# Patient Record
Sex: Male | Born: 1980 | Race: White | Hispanic: No | Marital: Single | Smoking: Former smoker
Health system: Southern US, Community
[De-identification: ages and names within clinical notes are randomized; demographics above are authoritative.]

## PROBLEM LIST (undated history)

## (undated) DIAGNOSIS — F209 Schizophrenia, unspecified: Secondary | ICD-10-CM

## (undated) DIAGNOSIS — F319 Bipolar disorder, unspecified: Secondary | ICD-10-CM

## (undated) DIAGNOSIS — F1010 Alcohol abuse, uncomplicated: Secondary | ICD-10-CM

## (undated) DIAGNOSIS — F1410 Cocaine abuse, uncomplicated: Secondary | ICD-10-CM

---

## 2003-08-22 ENCOUNTER — Emergency Department (HOSPITAL_COMMUNITY): Admission: EM | Admit: 2003-08-22 | Discharge: 2003-08-22 | Payer: Self-pay | Admitting: Emergency Medicine

## 2003-11-04 ENCOUNTER — Emergency Department (HOSPITAL_COMMUNITY): Admission: EM | Admit: 2003-11-04 | Discharge: 2003-11-04 | Payer: Self-pay | Admitting: Emergency Medicine

## 2004-05-16 ENCOUNTER — Emergency Department: Payer: Self-pay | Admitting: Emergency Medicine

## 2007-11-20 ENCOUNTER — Emergency Department (HOSPITAL_COMMUNITY): Admission: EM | Admit: 2007-11-20 | Discharge: 2007-11-20 | Payer: Self-pay | Admitting: Emergency Medicine

## 2012-04-01 ENCOUNTER — Encounter (HOSPITAL_COMMUNITY): Payer: Self-pay | Admitting: *Deleted

## 2012-04-01 ENCOUNTER — Emergency Department (HOSPITAL_COMMUNITY)
Admission: EM | Admit: 2012-04-01 | Discharge: 2012-04-01 | Disposition: A | Discharge status: Home / Self Care | Payer: Self-pay | Attending: Emergency Medicine | Admitting: Emergency Medicine

## 2012-04-01 DIAGNOSIS — F1410 Cocaine abuse, uncomplicated: Secondary | ICD-10-CM | POA: Insufficient documentation

## 2012-04-01 DIAGNOSIS — F1010 Alcohol abuse, uncomplicated: Secondary | ICD-10-CM | POA: Insufficient documentation

## 2012-04-01 DIAGNOSIS — IMO0002 Reserved for concepts with insufficient information to code with codable children: Secondary | ICD-10-CM | POA: Insufficient documentation

## 2012-04-01 DIAGNOSIS — F17200 Nicotine dependence, unspecified, uncomplicated: Secondary | ICD-10-CM | POA: Insufficient documentation

## 2012-04-01 DIAGNOSIS — R451 Restlessness and agitation: Secondary | ICD-10-CM

## 2012-04-01 HISTORY — DX: Alcohol abuse, uncomplicated: F10.10

## 2012-04-01 HISTORY — DX: Cocaine abuse, uncomplicated: F14.10

## 2012-04-01 LAB — URINALYSIS, ROUTINE W REFLEX MICROSCOPIC
Bilirubin Urine: NEGATIVE
Hgb urine dipstick: NEGATIVE
Ketones, ur: NEGATIVE mg/dL
Nitrite: NEGATIVE
Protein, ur: NEGATIVE mg/dL
Urobilinogen, UA: 1 mg/dL (ref 0.0–1.0)

## 2012-04-01 LAB — POCT I-STAT, CHEM 8
Calcium, Ion: 1.16 mmol/L (ref 1.12–1.23)
Creatinine, Ser: 1.3 mg/dL (ref 0.50–1.35)
Glucose, Bld: 89 mg/dL (ref 70–99)
HCT: 54 % — ABNORMAL HIGH (ref 39.0–52.0)
Hemoglobin: 18.4 g/dL — ABNORMAL HIGH (ref 13.0–17.0)
Potassium: 3.9 mEq/L (ref 3.5–5.1)
TCO2: 27 mmol/L (ref 0–100)

## 2012-04-01 LAB — BASIC METABOLIC PANEL
BUN: 8 mg/dL (ref 6–23)
Calcium: 9.6 mg/dL (ref 8.4–10.5)
Creatinine, Ser: 1.01 mg/dL (ref 0.50–1.35)
GFR calc non Af Amer: >90 mL/min (ref >90)
Glucose, Bld: 95 mg/dL (ref 70–99)
Potassium: 3.9 mEq/L (ref 3.5–5.1)

## 2012-04-01 LAB — CBC
MCH: 32.4 pg (ref 26.0–34.0)
MCHC: 35.4 g/dL (ref 30.0–36.0)
Platelets: 270 10*3/uL (ref 150–400)
RBC: 5.59 MIL/uL (ref 4.22–5.81)
RDW: 12.6 % (ref 11.5–15.5)

## 2012-04-01 LAB — RAPID URINE DRUG SCREEN, HOSP PERFORMED
Amphetamines: NOT DETECTED
Opiates: NOT DETECTED
Tetrahydrocannabinol: NOT DETECTED

## 2012-04-01 MED ORDER — ACETAMINOPHEN 325 MG PO TABS: 650.0000 mg | ORAL_TABLET | ORAL | Status: DC | PRN

## 2012-04-01 MED ORDER — IBUPROFEN 600 MG PO TABS: 600.0000 mg | ORAL_TABLET | Freq: Three times a day (TID) | ORAL | Status: DC | PRN

## 2012-04-01 MED ORDER — NICOTINE 21 MG/24HR TD PT24: 21.0000 mg | MEDICATED_PATCH | Freq: Every day | TRANSDERMAL | Status: DC

## 2012-04-01 NOTE — ED Provider Notes (Signed)
Telepsych performed. As per Dr. Rob Bunting, he doesn't meet criteria for IVC and is not homicidal or suicidal. Will refer to outpatient treatment program. Will give a list of counselors and psychiatrist. Otherwise, no medical issues. Safe for D/c     Richardean Canal, MD 04/01/12 [phone]
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
## 2012-04-01 NOTE — ED Notes (Signed)
Dr Henderson Cloud is going to rescind IVC and recommend outpatient etoh treatment.

## 2012-04-01 NOTE — ED Notes (Signed)
Pt changed in to blue paper scrubs. Pt and belongings wanded by security. 2 belongings bags are locked in triage locker E.

## 2012-04-01 NOTE — ED Notes (Signed)
Telepsych faxed and call to initiate placed.

## 2012-04-01 NOTE — ED Notes (Signed)
Pt discharging home with outpatient ETOH referrals per MD orders after Telepsych orders rescinded the IVC papers.

## 2012-04-01 NOTE — [facility] Counselor (Signed)
Pt is being d/c per telepsych. Counselor gave pt inpatient and outpatient SA and emergency mental health referrals. Pt stated he had no questions about referrals.
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
## 2015-03-06 ENCOUNTER — Ambulatory Visit: Payer: Self-pay

## 2015-03-12 ENCOUNTER — Ambulatory Visit: Payer: Self-pay | Attending: Family Medicine

## 2015-04-28 ENCOUNTER — Emergency Department (HOSPITAL_COMMUNITY)
Admission: EM | Admit: 2015-04-28 | Discharge: 2015-04-29 | Disposition: A | Discharge status: Home / Self Care | Payer: Self-pay | Attending: Emergency Medicine | Admitting: Emergency Medicine

## 2015-04-28 DIAGNOSIS — F1490 Cocaine use, unspecified, uncomplicated: Secondary | ICD-10-CM

## 2015-04-28 DIAGNOSIS — F14920 Cocaine use, unspecified with intoxication, uncomplicated: Secondary | ICD-10-CM | POA: Insufficient documentation

## 2015-04-28 DIAGNOSIS — Z720 Tobacco use: Secondary | ICD-10-CM | POA: Insufficient documentation

## 2015-04-28 DIAGNOSIS — R079 Chest pain, unspecified: Secondary | ICD-10-CM | POA: Insufficient documentation

## 2015-04-28 MED ORDER — LORAZEPAM 2 MG/ML IJ SOLN
1.0000 mg | Freq: Once | INTRAMUSCULAR | Status: AC
  Administered 2015-04-29: 1 mg via INTRAVENOUS
  Filled 2015-04-28: qty 1

## 2015-04-28 NOTE — ED Provider Notes (Signed)
CSN: [id number]     Arrival date & time 04/28/15  2341 History   First MD Initiated Contact with Patient 04/28/15 2343     No chief complaint on file.    (Consider location/radiation/quality/duration/timing/severity/associated sxs/prior Treatment) HPI Comments: Smoked crack cocaine and subsequently had chest pain  Patient is a 34 y.o. male presenting with chest pain. The history is provided by the patient.  Chest Pain Pain location:  Substernal area Pain quality: pressure   Pain radiates to:  Does not radiate Pain severity:  Moderate Onset quality:  Sudden Duration:  1 hour Timing:  Constant Progression:  Unchanged Chronicity:  New Context comment:  After smoking crack cocaine Relieved by:  Nothing Worsened by:  Nothing tried Associated symptoms: no cough, no fever, no shortness of breath and not vomiting     Past Medical History  Diagnosis Date  . Cocaine abuse   . Alcohol abuse    No past surgical history on file. No family history on file. Social History  Substance Use Topics  . Smoking status: Current Every Day Smoker    Types: Cigarettes  . Smokeless tobacco: Not on file  . Alcohol Use: Yes     Comment: daily    Review of Systems  Constitutional: Negative for fever.  Respiratory: Negative for cough and shortness of breath.   Cardiovascular: Positive for chest pain.  Gastrointestinal: Negative for vomiting.  All other systems reviewed and are negative.     Allergies  Review of patient's allergies indicates no known allergies.  Home Medications   Prior to Admission medications   Not on File   There were no vitals taken for this visit. Physical Exam  Constitutional: He is oriented to person, place, and time. He appears well-developed and well-nourished. No distress.  HENT:  Head: Normocephalic and atraumatic.  Mouth/Throat: Oropharynx is clear and moist. No oropharyngeal exudate.  Eyes: EOM are normal. Pupils are equal, round, and reactive to  light.  Neck: Normal range of motion. Neck supple.  Cardiovascular: Normal rate and regular rhythm.  Exam reveals no friction rub.   No murmur heard. Pulmonary/Chest: Effort normal and breath sounds normal. No respiratory distress. He has no wheezes. He has no rales.  Abdominal: Soft. He exhibits no distension. There is no tenderness. There is no rebound.  Musculoskeletal: Normal range of motion. He exhibits no edema.  Neurological: He is alert and oriented to person, place, and time. No cranial nerve deficit. He exhibits normal muscle tone. Coordination normal.  Skin: No rash noted. He is not diaphoretic.  Nursing note and vitals reviewed.   ED Course  Procedures (including critical care time) Labs Review Labs Reviewed  CBC  BASIC METABOLIC PANEL  I-STAT TROPOININ, ED    Imaging Review Dg Chest 2 View  04/29/2015   CLINICAL DATA:  Acute onset of centralized chest pain and shortness of breath. Initial encounter.  EXAM: CHEST  2 VIEW  COMPARISON:  Chest radiograph performed 11/04/2003  FINDINGS: The lungs are well-aerated and clear. There is no evidence of focal opacification, pleural effusion or pneumothorax.  The heart is normal in size; the mediastinal contour is within normal limits. No acute osseous abnormalities are seen.  IMPRESSION: No acute cardiopulmonary process seen.   Electronically Signed   By: Roanna Raider M.D.   On: 04/29/2015 00:53   I have personally reviewed and evaluated these images and lab results as part of my medical decision-making.   EKG Interpretation   Date/Time:  Monday April 28 2015 23:55:15 EDT Ventricular Rate:  75 PR Interval:  127 QRS Duration: 98 QT Interval:  367 QTC Calculation: 410 R Axis:   87 Text Interpretation:  Sinus rhythm No prior for comparison Confirmed by  Gwendolyn Grant  MD, Chanel Mckesson ([id number]) on 04/29/2015 12:16:47 AM      MDM   Final diagnoses:  Chest pain, unspecified chest pain type  Cocaine use    40M here with chest pain from  cocaine. Feeling ok now. Will check labs, EKG. Initial labs ok. Discussed serial troponin testing. Patient would rather go home. He's young, normal EKG, no further chest pain. He can f/u with his PCP. I feel going home without 2nd troponin is reasonable.   Elwin Mocha, MD 04/29/15 [phone]

## 2015-04-29 ENCOUNTER — Encounter (HOSPITAL_COMMUNITY): Payer: Self-pay | Admitting: Emergency Medicine

## 2015-04-29 ENCOUNTER — Emergency Department (HOSPITAL_COMMUNITY): Payer: Self-pay

## 2015-04-29 LAB — CBC
HCT: 46.6 % (ref 39.0–52.0)
Hemoglobin: 16.3 g/dL (ref 13.0–17.0)
MCH: 31.2 pg (ref 26.0–34.0)
MCHC: 35 g/dL (ref 30.0–36.0)
MCV: 89.1 fL (ref 78.0–100.0)
PLATELETS: 194 10*3/uL (ref 150–400)
RBC: 5.23 MIL/uL (ref 4.22–5.81)
RDW: 12.2 % (ref 11.5–15.5)
WBC: 10.6 10*3/uL — AB (ref 4.0–10.5)

## 2015-04-29 LAB — BASIC METABOLIC PANEL
ANION GAP: 10 (ref 5–15)
BUN: 11 mg/dL (ref 6–20)
CO2: 25 mmol/L (ref 22–32)
Calcium: 9.5 mg/dL (ref 8.9–10.3)
Chloride: 101 mmol/L (ref 101–111)
Creatinine, Ser: 1.13 mg/dL (ref 0.61–1.24)
GLUCOSE: 102 mg/dL — AB (ref 65–99)
POTASSIUM: 3.5 mmol/L (ref 3.5–5.1)
SODIUM: 136 mmol/L (ref 135–145)

## 2015-04-29 LAB — I-STAT TROPONIN, ED: Troponin i, poc: 0 ng/mL (ref 0.00–0.08)

## 2015-04-29 NOTE — ED Notes (Signed)
Pt to ED via [facility] with chest pain.  Onset after using cocaine.

## 2015-04-29 NOTE — ED Notes (Signed)
Pt st's after using cocaine he developed pain in central chest with tightness under left arm.  Pt denies any pain or discomfort at this time.  Family at bedside.

## 2015-04-29 NOTE — Discharge Instructions (Signed)

## 2017-03-12 ENCOUNTER — Ambulatory Visit (HOSPITAL_COMMUNITY)
Admission: EM | Admit: 2017-03-12 | Discharge: 2017-03-12 | Disposition: A | Discharge status: Home / Self Care | Payer: Self-pay | Attending: Family | Admitting: Family

## 2017-03-12 ENCOUNTER — Encounter (HOSPITAL_COMMUNITY): Payer: Self-pay | Admitting: Emergency Medicine

## 2017-03-12 DIAGNOSIS — H578 Other specified disorders of eye and adnexa: Secondary | ICD-10-CM

## 2017-03-12 DIAGNOSIS — Z23 Encounter for immunization: Secondary | ICD-10-CM

## 2017-03-12 DIAGNOSIS — S0502XA Injury of conjunctiva and corneal abrasion without foreign body, left eye, initial encounter: Secondary | ICD-10-CM

## 2017-03-12 MED ORDER — ERYTHROMYCIN 5 MG/GM OP OINT: TOPICAL_OINTMENT | OPHTHALMIC | 0 refills | Status: DC

## 2017-03-12 MED ORDER — TETANUS-DIPHTH-ACELL PERTUSSIS 5-2.5-18.5 LF-MCG/0.5 IM SUSP
INTRAMUSCULAR | Status: AC
  Filled 2017-03-12: qty 0.5

## 2017-03-12 MED ORDER — FLUORESCEIN SODIUM 0.6 MG OP STRP
ORAL_STRIP | OPHTHALMIC | Status: AC
  Filled 2017-03-12: qty 3

## 2017-03-12 MED ORDER — IBUPROFEN 800 MG PO TABS: 800.0000 mg | ORAL_TABLET | Freq: Three times a day (TID) | ORAL | 0 refills | Status: AC

## 2017-03-12 MED ORDER — TETANUS-DIPHTH-ACELL PERTUSSIS 5-2.5-18.5 LF-MCG/0.5 IM SUSP
0.5000 mL | Freq: Once | INTRAMUSCULAR | Status: AC
  Administered 2017-03-12: 0.5 mL via INTRAMUSCULAR

## 2017-03-12 NOTE — ED Triage Notes (Signed)
Pt reports saw dust in his left eye onset 1300  Sts he was cutting wood w/a chainsaw and did not use eye ware  Sx today include irritation, pain, watery and redness  A&O x4... NAD... Ambulatory

## 2017-03-12 NOTE — Discharge Instructions (Signed)
Most small abrasions (less than one-fourth of corneal surface area will heal overnight if the lid is down and there is no rubbing or squeezing. Ibuprofen for pain. Antibiotic eye drop.   If there is no improvement in your symptoms, or if there is any worsening of symptoms, or if you have any additional concerns, please return for re-evaluation; or, if we are closed, consider going to the Emergency Room for evaluation if symptoms urgent.

## 2017-03-12 NOTE — ED Provider Notes (Signed)
[facility]    CSN: [id number] Arrival date & time: 03/12/17  1402     History   Chief Complaint Chief Complaint  Patient presents with  . Eye Problem    HPI Jose Mitchell is a 36 y.o. male.   Chief complaint of left eye irritation x one day. Was using a chainsaw earlier this afternoon and reports some "sawdust" got in his eye.He is not concerned about any shards of metal from chain saw itself 'it was just saw dust.'    Watery discharge from left eye. No changes in vision, photophobia, N, vomiting, fever. Endorses left eye pain when he closes eyelid- when he pulls eyelid away from eye, no pain.  Last tetanus 7 years ago  Does not wear contacts.       Past Medical History:  Diagnosis Date  . Alcohol abuse   . Cocaine abuse     There are no active problems to display for this patient.   History reviewed. No pertinent surgical history.     Home Medications    Prior to Admission medications   Medication Sig Start Date End Date Taking? Authorizing Provider  erythromycin ophthalmic ointment Use one half inch four times daily to affected eye (s) x 5 days. 03/12/17   Allegra Grana, FNP  ibuprofen (ADVIL,MOTRIN) 800 MG tablet Take 1 tablet (800 mg total) by mouth 3 (three) times daily. 03/12/17 03/17/17  Allegra Grana, FNP    Family History History reviewed. No pertinent family history.  Social History Social History  Substance Use Topics  . Smoking status: Current Every Day Smoker    Types: Cigarettes  . Smokeless tobacco: Never Used  . Alcohol use Yes     Comment: daily     Allergies   Septra [sulfamethoxazole-trimethoprim]   Review of Systems Review of Systems  Constitutional: Negative for chills and fever.  Eyes: Positive for pain, discharge and redness. Negative for photophobia, itching and visual disturbance.  Respiratory: Negative for cough.   Cardiovascular: Negative for chest pain and palpitations.  Gastrointestinal:  Negative for nausea and vomiting.  Neurological: Negative for headaches.     Physical Exam Triage Vital Signs ED Triage Vitals [03/12/17 1459]  Enc Vitals Group     BP 135/89     Pulse Rate 63     Resp 16     Temp 97.9 F (36.6 C)     Temp Source Oral     SpO2 100 %     Weight      Height      Head Circumference      Peak Flow      Pain Score      Pain Loc      Pain Edu?      Excl. in GC?    No data found.   Updated Vital Signs BP 135/89 (BP Location: Left Arm)   Pulse 63   Temp 97.9 F (36.6 C) (Oral)   Resp 16   SpO2 100%   Visual Acuity Right Eye Distance:   Left Eye Distance:   Bilateral Distance:    Right Eye Near:   Left Eye Near:    Bilateral Near:     Physical Exam  Constitutional: He appears well-developed and well-nourished.  HENT:  Head: Normocephalic and atraumatic.  Right Ear: Hearing, tympanic membrane, external ear and ear canal normal. No drainage, swelling or tenderness. Tympanic membrane is not injected, not erythematous and not bulging. No middle ear effusion. No  decreased hearing is noted.  Left Ear: Hearing, tympanic membrane, external ear and ear canal normal. No drainage, swelling or tenderness. Tympanic membrane is not injected, not erythematous and not bulging.  No middle ear effusion. No decreased hearing is noted.  Nose: Nose normal. Right sinus exhibits no maxillary sinus tenderness and no frontal sinus tenderness. Left sinus exhibits no maxillary sinus tenderness and no frontal sinus tenderness.  Mouth/Throat: Uvula is midline, oropharynx is clear and moist and mucous membranes are normal. No oropharyngeal exudate, posterior oropharyngeal edema, posterior oropharyngeal erythema or tonsillar abscesses.  Eyes: Pupils are equal, round, and reactive to light. Conjunctivae, EOM and lids are normal. Lids are everted and swept, no foreign bodies found.  No external eye lesions. Surrounding skin intact.   Right eye:   Mild injection of  the conjunctiva. No white spots, opacity, or foreign body appreciated. No collection of blood or pus in the anterior chamber. No ciliary flush surrounding iris.   Left eye:   Diffuse injection of the conjunctiva. No white spots, opacity, or foreign body appreciated. No collection of blood or pus in the anterior chamber. No ciliary flush surrounding iris.   With fluorescin, no fine branches. 3 abrasions noted -  3 oclock 65mm 6 oclock 80mm 9 oclock 54mm  No foreign bodies appreciated.  No photophobia or eye pain appreciated during exam.     Cardiovascular: Regular rhythm and normal heart sounds.   Pulmonary/Chest: Effort normal and breath sounds normal. No respiratory distress. He has no wheezes. He has no rhonchi. He has no rales.  Lymphadenopathy:       Head (right side): No submental, no submandibular, no tonsillar, no preauricular, no posterior auricular and no occipital adenopathy present.       Head (left side): No submental, no submandibular, no tonsillar, no preauricular, no posterior auricular and no occipital adenopathy present.    He has no cervical adenopathy.       Right cervical: No superficial cervical, no deep cervical and no posterior cervical adenopathy present.      Left cervical: No superficial cervical, no deep cervical and no posterior cervical adenopathy present.  Neurological: He is alert.  Skin: Skin is warm and dry.  Psychiatric: He has a normal mood and affect. His speech is normal and behavior is normal.  Vitals reviewed.    UC Treatments / Results  Labs (all labs ordered are listed, but only abnormal results are displayed) Labs Reviewed - No data to display  EKG  EKG Interpretation None       Radiology No results found.  Procedures Procedures (including critical care time)  Medications Ordered in UC Medications  Tdap (BOOSTRIX) injection 0.5 mL (0.5 mLs Intramuscular Given 03/12/17 1657)     Initial Impression / Assessment and Plan / UC  Course  I have reviewed the triage vital signs and the nursing notes.  Pertinent labs & imaging results that were available during my care of the patient were reviewed by me and considered in my medical decision making (see chart for details).       Final Clinical Impressions(s) / UC Diagnoses   Final diagnoses:  Abrasion of left cornea, initial encounter   History and exam most consistent with corneal abrasion. Vision intact. Will treat with topical antibiotic. Given Tdap Booster today. No evidence of foreign bodies. Close vigilance advised patient  and anticipated guidance that likely a small corneal abrasion such as his would heal overnight. For pain management, advised ibuprofen. Patient requested something  stronger, and I gave prescription strength ibuprofen for pain.  return precautions given.   New Prescriptions New Prescriptions   ERYTHROMYCIN OPHTHALMIC OINTMENT    Use one half inch four times daily to affected eye (s) x 5 days.   IBUPROFEN (ADVIL,MOTRIN) 800 MG TABLET    Take 1 tablet (800 mg total) by mouth 3 (three) times daily.     Controlled Substance Prescriptions [place] Controlled Substance Registry consulted? Not Applicable   Allegra Grana, FNP 03/12/17 [phone]

## 2017-05-04 ENCOUNTER — Encounter (HOSPITAL_COMMUNITY): Payer: Self-pay | Admitting: Family Medicine

## 2017-05-04 ENCOUNTER — Ambulatory Visit (HOSPITAL_COMMUNITY)
Admission: EM | Admit: 2017-05-04 | Discharge: 2017-05-04 | Disposition: A | Discharge status: Home / Self Care | Payer: Self-pay | Attending: Family Medicine | Admitting: Family Medicine

## 2017-05-04 DIAGNOSIS — Z882 Allergy status to sulfonamides status: Secondary | ICD-10-CM | POA: Insufficient documentation

## 2017-05-04 DIAGNOSIS — R21 Rash and other nonspecific skin eruption: Secondary | ICD-10-CM | POA: Insufficient documentation

## 2017-05-04 DIAGNOSIS — J039 Acute tonsillitis, unspecified: Secondary | ICD-10-CM | POA: Insufficient documentation

## 2017-05-04 DIAGNOSIS — J029 Acute pharyngitis, unspecified: Secondary | ICD-10-CM

## 2017-05-04 DIAGNOSIS — F17210 Nicotine dependence, cigarettes, uncomplicated: Secondary | ICD-10-CM | POA: Insufficient documentation

## 2017-05-04 DIAGNOSIS — R509 Fever, unspecified: Secondary | ICD-10-CM

## 2017-05-04 LAB — POCT RAPID STREP A: Streptococcus, Group A Screen (Direct): NEGATIVE

## 2017-05-04 MED ORDER — AMOXICILLIN 500 MG PO CAPS: 1000.0000 mg | ORAL_CAPSULE | Freq: Two times a day (BID) | ORAL | 0 refills | Status: DC

## 2017-05-04 NOTE — ED Provider Notes (Signed)
[facility]    CSN: [id number] Arrival date & time: 05/04/17  1505     History   Chief Complaint Chief Complaint  Patient presents with  . Sore Throat  . Rash  . Fever    HPI Jose Mitchell is a 36 y.o. male.   36 year old male complaining of sore throat within abdominal rash that developed 2-1/2 days ago he states that on Monday, 2 days ago he had a temperature of 99 to 102. He was feeling ill until today when he felt a little better and went to work and decided to come and have his throat and rash checked out.      Past Medical History:  Diagnosis Date  . Alcohol abuse   . Cocaine abuse (HCC)     There are no active problems to display for this patient.   History reviewed. No pertinent surgical history.     Home Medications    Prior to Admission medications   Medication Sig Start Date End Date Taking? Authorizing Provider  amoxicillin (AMOXIL) 500 MG capsule Take 2 capsules (1,000 mg total) by mouth 2 (two) times daily. 05/04/17   Hayden Rasmussen, NP  erythromycin ophthalmic ointment Use one half inch four times daily to affected eye (s) x 5 days. 03/12/17   Allegra Grana, FNP    Family History History reviewed. No pertinent family history.  Social History Social History  Substance Use Topics  . Smoking status: Current Every Day Smoker    Types: Cigarettes  . Smokeless tobacco: Never Used  . Alcohol use Yes     Comment: daily     Allergies   Septra [sulfamethoxazole-trimethoprim]   Review of Systems Review of Systems  Constitutional: Positive for activity change and fever.  HENT: Positive for sore throat.   Respiratory: Negative.   Cardiovascular: Negative.   Skin: Positive for rash.  Neurological: Negative.   All other systems reviewed and are negative.    Physical Exam Triage Vital Signs ED Triage Vitals [05/04/17 1524]  Enc Vitals Group     BP (!) 143/93     Pulse Rate 85     Resp 18     Temp 98.3 F (36.8 C)     Temp src      SpO2 100 %     Weight      Height      Head Circumference      Peak Flow      Pain Score 3     Pain Loc      Pain Edu?      Excl. in GC?    No data found.   Updated Vital Signs BP (!) 143/93   Pulse 85   Temp 98.3 F (36.8 C)   Resp 18   SpO2 100%   Visual Acuity Right Eye Distance:   Left Eye Distance:   Bilateral Distance:    Right Eye Near:   Left Eye Near:    Bilateral Near:     Physical Exam  Constitutional: He is oriented to person, place, and time. He appears well-developed and well-nourished. No distress.  HENT:  Mouth/Throat: Oropharyngeal exudate present.  Oropharynx with enlarged bilateraltonsils, Bell 50 red appearance with exudates. Airway widely patent.  Eyes: EOM are normal.  Neck: Normal range of motion. Neck supple.  Cardiovascular: Normal rate.   Pulmonary/Chest: Effort normal. No respiratory distress.  Musculoskeletal: He exhibits no edema.  Neurological: He is alert and oriented to person, place, and time.  He exhibits normal muscle tone.  Skin: Skin is warm and dry.  Psychiatric: He has a normal mood and affect.  Nursing note and vitals reviewed.    UC Treatments / Results  Labs (all labs ordered are listed, but only abnormal results are displayed) Labs Reviewed  CULTURE, GROUP A STREP [facility])  POCT RAPID STREP A    EKG  EKG Interpretation None       Radiology No results found.  Procedures Procedures (including critical care time)  Medications Ordered in UC Medications - No data to display   Initial Impression / Assessment and Plan / UC Course  I have reviewed the triage vital signs and the nursing notes.  Pertinent labs & imaging results that were available during my care of the patient were reviewed by me and considered in my medical decision making (see chart for details).    Take your medicine as directed. Ibuprofen 600-800 mg every 6-8 hours. Tylenol every 4 hours if needed. Drink plenty fluids and  stay well-hydrated. May use Cepacol lozenges for sore throat    Final Clinical Impressions(s) / UC Diagnoses   Final diagnoses:  Exudative tonsillitis  Rash    New Prescriptions New Prescriptions   AMOXICILLIN (AMOXIL) 500 MG CAPSULE    Take 2 capsules (1,000 mg total) by mouth 2 (two) times daily.     Controlled Substance Prescriptions [place] Controlled Substance Registry consulted? Not Applicable   Hayden Rasmussen, NP 05/04/17 [phone]

## 2017-05-04 NOTE — ED Triage Notes (Signed)
Pt here for sore throat, rash and fever. Last motrin 5 this am.

## 2017-05-04 NOTE — Discharge Instructions (Signed)
Take your medicine as directed. Ibuprofen 600-800 mg every 6-8 hours. Tylenol every 4 hours if needed. Drink plenty fluids and stay well-hydrated. May use Cepacol lozenges for sore throat

## 2017-05-06 LAB — CULTURE, GROUP A STREP (THRC)

## 2018-02-22 ENCOUNTER — Other Ambulatory Visit: Payer: Self-pay

## 2018-02-22 ENCOUNTER — Encounter (HOSPITAL_COMMUNITY): Payer: Self-pay | Admitting: *Deleted

## 2018-02-22 ENCOUNTER — Emergency Department (HOSPITAL_COMMUNITY)
Admission: EM | Admit: 2018-02-22 | Discharge: 2018-02-23 | Disposition: A | Discharge status: Home / Self Care | Payer: BLUE CROSS/BLUE SHIELD | Attending: Emergency Medicine | Admitting: Emergency Medicine

## 2018-02-22 DIAGNOSIS — F319 Bipolar disorder, unspecified: Secondary | ICD-10-CM

## 2018-02-22 DIAGNOSIS — R51 Headache: Secondary | ICD-10-CM | POA: Insufficient documentation

## 2018-02-22 DIAGNOSIS — F1410 Cocaine abuse, uncomplicated: Secondary | ICD-10-CM | POA: Diagnosis present

## 2018-02-22 DIAGNOSIS — F250 Schizoaffective disorder, bipolar type: Secondary | ICD-10-CM | POA: Diagnosis not present

## 2018-02-22 DIAGNOSIS — F17210 Nicotine dependence, cigarettes, uncomplicated: Secondary | ICD-10-CM | POA: Diagnosis not present

## 2018-02-22 HISTORY — DX: Bipolar disorder, unspecified: F31.9

## 2018-02-22 HISTORY — DX: Schizophrenia, unspecified: F20.9

## 2018-02-22 LAB — COMPREHENSIVE METABOLIC PANEL
ALBUMIN: 4.5 g/dL (ref 3.5–5.0)
ALT: 11 U/L (ref 0–44)
ANION GAP: 9 (ref 5–15)
AST: 16 U/L (ref 15–41)
Alkaline Phosphatase: 47 U/L (ref 38–126)
BILIRUBIN TOTAL: 0.4 mg/dL (ref 0.3–1.2)
BUN: 8 mg/dL (ref 6–20)
CHLORIDE: 103 mmol/L (ref 98–111)
CO2: 30 mmol/L (ref 22–32)
Calcium: 9.6 mg/dL (ref 8.9–10.3)
Creatinine, Ser: 0.99 mg/dL (ref 0.61–1.24)
GFR calc Af Amer: >60 mL/min (ref >60)
GFR calc non Af Amer: >60 mL/min (ref >60)
GLUCOSE: 55 mg/dL — AB (ref 70–99)
POTASSIUM: 3.5 mmol/L (ref 3.5–5.1)
SODIUM: 142 mmol/L (ref 135–145)
TOTAL PROTEIN: 7.6 g/dL (ref 6.5–8.1)

## 2018-02-22 LAB — RAPID URINE DRUG SCREEN, HOSP PERFORMED
AMPHETAMINES: NOT DETECTED
BENZODIAZEPINES: NOT DETECTED
Barbiturates: NOT DETECTED
Cocaine: POSITIVE — AB
OPIATES: NOT DETECTED
TETRAHYDROCANNABINOL: NOT DETECTED

## 2018-02-22 LAB — CBC
HEMATOCRIT: 48.9 % (ref 39.0–52.0)
HEMOGLOBIN: 16.8 g/dL (ref 13.0–17.0)
MCH: 30.6 pg (ref 26.0–34.0)
MCHC: 34.4 g/dL (ref 30.0–36.0)
MCV: 89.1 fL (ref 78.0–100.0)
PLATELETS: 315 10*3/uL (ref 150–400)
RBC: 5.49 MIL/uL (ref 4.22–5.81)
RDW: 12.2 % (ref 11.5–15.5)
WBC: 10 10*3/uL (ref 4.0–10.5)

## 2018-02-22 LAB — ETHANOL: Alcohol, Ethyl (B): <10 mg/dL (ref <10)

## 2018-02-22 MED ORDER — TRAZODONE HCL 100 MG PO TABS
100.0000 mg | ORAL_TABLET | Freq: Every evening | ORAL | Status: DC | PRN
  Administered 2018-02-22: 100 mg via ORAL
  Filled 2018-02-22: qty 1

## 2018-02-22 MED ORDER — HALOPERIDOL 5 MG PO TABS
5.0000 mg | ORAL_TABLET | Freq: Four times a day (QID) | ORAL | Status: DC | PRN
  Filled 2018-02-22: qty 1

## 2018-02-22 MED ORDER — HYDROXYZINE HCL 25 MG PO TABS
50.0000 mg | ORAL_TABLET | Freq: Three times a day (TID) | ORAL | Status: DC | PRN
  Administered 2018-02-22: 50 mg via ORAL
  Filled 2018-02-22: qty 2

## 2018-02-22 MED ORDER — NICOTINE 21 MG/24HR TD PT24
21.0000 mg | MEDICATED_PATCH | Freq: Every day | TRANSDERMAL | Status: DC
  Administered 2018-02-22: 21 mg via TRANSDERMAL
  Filled 2018-02-22: qty 1

## 2018-02-22 MED ORDER — HALOPERIDOL LACTATE 5 MG/ML IJ SOLN: 5.0000 mg | Freq: Four times a day (QID) | INTRAMUSCULAR | Status: DC | PRN

## 2018-02-22 MED ORDER — LOPERAMIDE HCL 2 MG PO CAPS: 4.0000 mg | ORAL_CAPSULE | ORAL | Status: DC | PRN

## 2018-02-22 MED ORDER — IBUPROFEN 800 MG PO TABS
800.0000 mg | ORAL_TABLET | Freq: Four times a day (QID) | ORAL | Status: DC | PRN
  Administered 2018-02-22: 800 mg via ORAL
  Filled 2018-02-22: qty 1

## 2018-02-22 NOTE — ED Notes (Signed)
Pt has been dressed out in paper scrubs.  Pt's belongings placed in bag. Pt was been wanded by security.

## 2018-02-22 NOTE — ED Notes (Signed)
Placed the following patient belongings in locker 28:  1 bracelet, 1 watch, 1 brown leather wallet, 1 lighter, 1 container tobacco dip, 1 pair black tennis shoes, 1 white t-shirt, 1 pair purple boxers.

## 2018-02-22 NOTE — ED Provider Notes (Signed)
[facility]-EMERGENCY DEPT Provider Note  CSN: [id number] Arrival date & time: 02/22/18  1913  History   Chief Complaint No chief complaint on file.   HPI Jose Mitchell is a 37 y.o. male with a psychiatric history of bipolar 1 vs schizoaffective bipolar type, cocaine use and alcohol use and no significant medical history who presented to the ED for psychiatric evaluation. He states that he has not been himself and feels like he needs to come in. He admits to being without psych meds for 2 years and stopped taking them because he did not like how they made him feel. Patient briefly talks about receiving threatening phone calls which have prompted him to call law enforcement, but has had no success with them. Patient also states that his parents are concerned and say that he experiences hallucinations when he is not. He does admit to mood lability and states "I feel like I'm walking on eggshells." Admits to daily cocaine use, but denies alcohol and other substances. Currently denies SI, HI and AVH.  Patient currently complains of headache. Denies vision changes, paresthesias, facial droop, weakness or slurred speech.  Past Medical History:  Diagnosis Date  . Alcohol abuse   . Bipolar 1 disorder (HCC)   . Cocaine abuse (HCC)   . Schizophrenia (HCC)     There are no active problems to display for this patient.   History reviewed. No pertinent surgical history.      Home Medications    Prior to Admission medications   Medication Sig Start Date End Date Taking? Authorizing Provider  amoxicillin (AMOXIL) 500 MG capsule Take 2 capsules (1,000 mg total) by mouth 2 (two) times daily. Patient not taking: Reported on 02/22/2018 05/04/17   Hayden Rasmussen, NP  erythromycin ophthalmic ointment Use one half inch four times daily to affected eye (s) x 5 days. Patient not taking: Reported on 02/22/2018 03/12/17   Allegra Grana, FNP    Family History No family history on  file.  Social History Social History   Tobacco Use  . Smoking status: Current Every Day Smoker    Packs/day: 1.00    Types: Cigarettes  . Smokeless tobacco: Current User    Types: Chew  Substance Use Topics  . Alcohol use: Not Currently    Comment: daily  . Drug use: Yes    Types: Cocaine     Allergies   Septra [sulfamethoxazole-trimethoprim]   Review of Systems Review of Systems  Constitutional: Negative.   HENT: Negative.   Eyes: Negative for visual disturbance.  Respiratory: Negative.   Cardiovascular: Negative.   Gastrointestinal: Negative.   Genitourinary: Negative.   Musculoskeletal: Negative.   Skin: Negative.   Neurological: Positive for headaches. Negative for dizziness, facial asymmetry, speech difficulty, weakness, light-headedness and numbness.  Hematological: Negative.   Psychiatric/Behavioral: Positive for behavioral problems.     Physical Exam Updated Vital Signs BP (!) 156/90 (BP Location: Left Arm)   Pulse (!) 104   Temp 98.5 F (36.9 C) (Oral)   Resp 16   Ht 6\' 2"  (1.88 m)   Wt 77.1 kg (170 lb)   SpO2 99%   BMI 21.83 kg/m   Physical Exam  Constitutional: He is oriented to person, place, and time. He appears well-developed and well-nourished. No distress.  HENT:  Head: Normocephalic and atraumatic.  Eyes: Pupils are equal, round, and reactive to light. Conjunctivae, EOM and lids are normal.  Neck: Normal range of motion. Neck supple.  Cardiovascular: Normal rate,  regular rhythm and intact distal pulses.  Pulmonary/Chest: Effort normal and breath sounds normal.  Abdominal: Soft. Bowel sounds are normal. There is no tenderness.  Musculoskeletal: Normal range of motion.  Neurological: He is alert and oriented to person, place, and time. He has normal strength. No cranial nerve deficit or sensory deficit. He exhibits normal muscle tone. Coordination and gait normal.  Psychiatric: He has a normal mood and affect. His behavior is normal.  Thought content normal. His speech is not rapid and/or pressured and not tangential. He is not agitated and not actively hallucinating. Cognition and memory are normal. He expresses impulsivity.  Patient's thought process and speech is logical, linear and goal directed. He is able to maintain attention and focus throughout the encounter. No evidence of being internally stimulated and does not exhibit thought blocking. He is attentive.  Nursing note and vitals reviewed.    ED Treatments / Results  Labs (all labs ordered are listed, but only abnormal results are displayed) Labs Reviewed  COMPREHENSIVE METABOLIC PANEL - Abnormal; Notable for the following components:      Result Value   Glucose, Bld 55 (*)    All other components within normal limits  RAPID URINE DRUG SCREEN, HOSP PERFORMED - Abnormal; Notable for the following components:   Cocaine POSITIVE (*)    All other components within normal limits  ETHANOL  CBC    EKG None  Radiology No results found.  Procedures Procedures (including critical care time)  Medications Ordered in ED Medications  nicotine (NICODERM CQ - dosed in mg/24 hours) patch 21 mg (21 mg Transdermal Patch Applied 02/22/18 2207)  hydrOXYzine (ATARAX/VISTARIL) tablet 50 mg (50 mg Oral Given 02/22/18 2208)  loperamide (IMODIUM) capsule 4 mg (has no administration in time range)  ibuprofen (ADVIL,MOTRIN) tablet 800 mg (800 mg Oral Given 02/22/18 2207)  haloperidol (HALDOL) tablet 5 mg (has no administration in time range)  haloperidol lactate (HALDOL) injection 5 mg (has no administration in time range)  traZODone (DESYREL) tablet 100 mg (100 mg Oral Given 02/22/18 2240)     Initial Impression / Assessment and Plan / ED Course  Triage vital signs and the nursing notes have been reviewed.  Pertinent labs & imaging results that were available during care of the patient were reviewed and considered in medical decision making (see chart for  details).  Patient presents to the ED requesting psychiatric evaluation. On interview, patient's mood and affect are euthymic. He does not display any behaviors that are consistent with depression, mania/hypomania or psychosis. Patient's thought process and speech is logical, linear and goal directed. No evidence to suggest psychosis and he is not currently high. Patient reports wanting to help with mood lability which may come from underlying personality traits and likely his substance use. He states that he was on medication in the past and seems willing to be restarted on appropriate medications. At this time, he does not meet criteria for IVC or inpatient psychiatric hospitalization as he is not a danger to himself or others. However, he may benefit from TTS evaluation and referral to appropriate outpatient resources.  From a medical perspective, patient is cleared. Physical exam and labs are normal and do not require additional evaluation.  Clinical Course as of Feb 23 2352  Wed Feb 22, 2018  2340 UDS + for cocaine which is expected as patient openly admits to daily use. Remaining labs normal.   [GM]    Clinical Course User Index [GM] Shamere Dilworth, Sharyon MedicusGabrielle I, PA-C  Final Clinical Impressions(s) / ED Diagnoses  1. Bipolar 1 Disorder vs Substance Induced Mood Disorder. Patient is requesting assistance with medication and outpatient follow-up. TTS consult placed for further evaluation. 2. Cocaine Use Disorder. Appears interested in quitting. Resources for substance use treatment will be given at discharge.  Dispo: TTS consulted for further evaluation.  Final diagnoses:  Bipolar 1 disorder [facility])  Cocaine use disorder [facility])    ED Discharge Orders    None        Reva Bores 02/22/18 2354    Arby Barrette, MD 02/23/18 [phone]

## 2018-02-22 NOTE — ED Triage Notes (Signed)
Pt states he has been getting threatening phone calls today.  Pt states that his parents claim that he is hearing voices and seeing things that aren't there.  Pt reports not taking his medications for the past two years.  Pt reports doing cocaine today. Pt reports intermittent pain on his right x a couple of weeks.  Pt doesn't feel like he has AV hallucinations and denies SI/HI.  Pt doesn't feel safe at home and pt states his parents won't let him stay at their house.  Hx Schizophrenia, bipolar

## 2018-02-23 LAB — CBG MONITORING, ED: Glucose-Capillary: 83 mg/dL (ref 70–99)

## 2018-02-23 NOTE — BH Assessment (Addendum)
Assessment Note  Jose Mitchell is an 37 y.o. male, who presents voluntary and unaccompanied to [facility]. Clinician asked the pt, "what brought you to the hospital?" Pt reported, "mental health and I forgot." Pt reported, "to get on med's." Pt reported, he has been off his medications for a while. Pt reported, he has been paranoid because he has been getting threatening calls on his cell phone. Pt reported, he reported the issue to the sheriffs' department, "they didn't do nothing." Pt reported, a couple days ago he seen someone on his yard. Pt denies, SI, HI, AVH, self-injurious behaviors and access to weapons.   Pt denies abuse.  Pt reported, using "a lot," of cocaine, around 1pm today. Pt's UDS is positive for cocaine. Pt denies, being linked to OPT resources (medication management and/or counseling.) Pt reported, he was linked to [facility] for medication management. Pt reported, he was at [facility] for ten months in the past for substance use.  Pt presents sleeping in scrubs with logical/coherent speech. Pt's eye contact was poor. Pt's mood was sad. Pt's affect was flat. Pt's thought process was coherent/relevant. Pt's judgement was partial. Pt was oriented x3. Pt's concentration was fair. Pt's insight was fair. Pt's impulse control was poor. Pt reported, if inpatient treatment was recommended he would sign-in voluntarily. Clinician discussed the three possible dispositions (discharge with OPT resources, observation/re-evaluation or inpatient treatment) in detail.   Diagnosis: Bipolar 1 Disorder (HCC)                    F14.20 Cocaine use Disorder, severe.  Past Medical History:  Past Medical History:  Diagnosis Date  . Alcohol abuse   . Bipolar 1 disorder (HCC)   . Cocaine abuse (HCC)   . Schizophrenia (HCC)     History reviewed. No pertinent surgical history.  Family History: No family history on file.  Social History:  reports that he has been smoking cigarettes.  He has been smoking about 1.00  pack per day. His smokeless tobacco use includes chew. He reports that he drank alcohol. He reports that he has current or past drug history. Drug: Cocaine.  Additional Social History:  Alcohol / Drug Use Pain Medications: See MAR Prescriptions: See MAR Over the Counter: See MAR History of alcohol / drug use?: Yes Substance #1 Name of Substance 1: Cocaine. 1 - Age of First Use: UTA 1 - Amount (size/oz): Pt reported, using "a lot," of cocaine, around 1pm today.  1 - Frequency: Daily.  1 - Duration: Ongoing. 1 - Last Use / Amount: Pt reported, around 1pm, today.   CIWA: CIWA-Ar BP: (!) 156/90 Pulse Rate: (!) 104 COWS:    Allergies:  Allergies  Allergen Reactions  . Septra [Sulfamethoxazole-Trimethoprim]     Childhood allergy. Does not know reaction    Home Medications:  (Not in a hospital admission)  OB/GYN Status:  No LMP for male patient.  General Assessment Data [facility] of Assessment: [facility] TTS Assessment: In system Is this a Tele or Face-to-Face Assessment?: Face-to-Face Is this an Initial Assessment or a Re-assessment for this encounter?: Initial Assessment Marital status: Single Living Arrangements: Alone Can pt return to current living arrangement?: Yes Admission Status: Voluntary Is patient capable of signing voluntary admission?: Yes Referral Source: Self/Family/Friend Insurance type: BCBS.     Crisis Care Plan Living Arrangements: Alone Legal Guardian: Other:(Self.) Name of Psychiatrist: NA Name of Therapist: NA  Education Status Is patient currently in school?: No Is the patient employed, unemployed or receiving  disability?: Unemployed  Risk to self with the past 6 months Suicidal Ideation: No(Pt denies. ) Has patient been a risk to self within the past 6 months prior to admission? : No(Pt denies. ) Suicidal Intent: No(Pt denies. ) Has patient had any suicidal intent within the past 6 months prior to admission? : No(Pt denies. ) Is patient at  risk for suicide?: No Suicidal Plan?: No(Pt denies. ) Has patient had any suicidal plan within the past 6 months prior to admission? : No Access to Means: No What has been your use of drugs/alcohol within the last 12 months?: Cocaine.  Previous Attempts/Gestures: No How many times?: 0 Other Self Harm Risks: Pt denies.  Triggers for Past Attempts: None known Intentional Self Injurious Behavior: None(Pt denies. ) Family Suicide History: No Recent stressful life event(s): Other (Comment)(not having medications, paranoia, receiving threatenig calls) Persecutory voices/beliefs?: No Depression: Yes Depression Symptoms: Insomnia, Feeling worthless/self pity Substance abuse history and/or treatment for substance abuse?: Yes Suicide prevention information given to non-admitted patients: Not applicable  Risk to Others within the past 6 months Homicidal Ideation: No(Pt denies. ) Does patient have any lifetime risk of violence toward others beyond the six months prior to admission? : No(Pt denies. ) Thoughts of Harm to Others: No(Pt denies. ) Current Homicidal Intent: No Current Homicidal Plan: No Access to Homicidal Means: No Identified Victim: NA History of harm to others?: No Assessment of Violence: None Noted Violent Behavior Description: NA Does patient have access to weapons?: No(Pt denies. ) Criminal Charges Pending?: No Does patient have a court date: No Is patient on probation?: No  Psychosis Hallucinations: Visual Delusions: None noted  Mental Status Report Appearance/Hygiene: In scrubs Eye Contact: Poor Motor Activity: Unremarkable Speech: Logical/coherent Level of Consciousness: Sleeping Mood: Sad Affect: Flat Anxiety Level: None Thought Processes: Coherent, Relevant Judgement: Partial Orientation: Person, Place, Time Obsessive Compulsive Thoughts/Behaviors: None  Cognitive Functioning Concentration: Fair Memory: Recent Intact Is patient IDD: No Is patient DD?:  No Insight: Fair Impulse Control: Poor Appetite: Poor Have you had any weight changes? : Loss Amount of the weight change? (lbs): (Pt lost 20 pounds over a month.) Sleep: Decreased Total Hours of Sleep: 2 Vegetative Symptoms: Unable to Assess  ADLScreening [facility] Assessment Services) Patient's cognitive ability adequate to safely complete daily activities?: Yes Patient able to express need for assistance with ADLs?: Yes Independently performs ADLs?: Yes (appropriate for developmental age)  Prior Inpatient Therapy Prior Inpatient Therapy: Yes Prior Therapy Dates: UTA Prior Therapy Facilty/Provider(s): [facility]. Reason for Treatment: Substance use.   Prior Outpatient Therapy Prior Outpatient Therapy: Yes Prior Therapy Dates: Years ago.  Prior Therapy Facilty/Provider(s): [facility]. Reason for Treatment: Medication management. Does patient have an ACCT team?: No Does patient have Intensive In-House Services?  : No Does patient have [facility] services? : No Does patient have P4CC services?: No  ADL Screening (condition at time of admission) Patient's cognitive ability adequate to safely complete daily activities?: Yes Is the patient deaf or have difficulty hearing?: No Does the patient have difficulty seeing, even when wearing glasses/contacts?: No Does the patient have difficulty concentrating, remembering, or making decisions?: Yes Patient able to express need for assistance with ADLs?: Yes Does the patient have difficulty dressing or bathing?: No Independently performs ADLs?: Yes (appropriate for developmental age) Does the patient have difficulty walking or climbing stairs?: No Weakness of Legs: None Weakness of Arms/Hands: None  Home Assistive Devices/Equipment Home Assistive Devices/Equipment: None    Abuse/Neglect Assessment (Assessment to be complete while patient  is alone) Abuse/Neglect Assessment Can Be Completed: Yes Physical Abuse: Denies(Pt denies. ) Verbal Abuse:  Denies(Pt denies. ) Sexual Abuse: Denies(Pt denies. ) Exploitation of patient/patient's resources: Denies(Pt denies. ) Self-Neglect: Denies(Pt denies. )     Advance Directives (For Healthcare) Does Patient Have a Medical Advance Directive?: No Would patient like information on creating a medical advance directive?: Yes (ED - Information included in AVS)          Disposition: Nira Conn, NP recommends the pt does not meet inpatient criteria. Clinician provided the pt's nurse with OPT resources. Disposition discussed with Dr. Bebe Shaggy and Kiristin, RN.   Disposition Initial Assessment Completed for this Encounter: Yes  On Site Evaluation by: Redmond Pulling, MS, LPC, CRC. Reviewed with Physician: Dr. Bebe Shaggy and Nira Conn, NP.  Redmond Pulling 02/23/2018 1:28 AM   Redmond Pulling, MS, LPC, CRC Triage Specialist [phone]

## 2018-02-23 NOTE — Discharge Instructions (Addendum)
Substance Abuse Treatment Programs ° °Intensive Outpatient Programs °[facility]     °[address]      °[place], [place]                   °[phone]      ° °[facility] °[address] °[place], [place] °[phone] ° °[facility]     °(Inpatient and outpatient)     °[address]           °[phone]   ° °[facility] °[phone] (Suboxone and Methadone) ° °[address]      °[place], [place] [address]      °336-882-2125      ° °[address] °[place], [place] °852-3033 ° °[facility] (Outpatient/Inpatient, [facility])    °(insurance only) [phone]      °       °[facility] (Groups & Residential) °[place], [place] °[phone] ° °   °[facility]     °[address]     °[place], [place]      °[phone]      ° °[facility] (for caregivers and family) °[address] °[place], [place] °336-299-4655 ° ° ° ° ° °Residential Treatment Programs °[facility]      °[address], [place], [place] [address]  °([phone]      ° °[facility] °[address] [place], [place] [address] °[phone] ° °[facility]        °336-248-8914      ° °[facility] °1-800-659-3381 ° °[facility]             °[address]                                         °[place], [place]                                                °877-615-2722 or 336-784-9470                              ° °[facility] [place] °[address] °[place], [address] °1.877.941.8954 ° °[facility]    °[address]      °[place], [place]     °[phone]      ° °[facility] °[address] °, [place] °336-285-9073 ° °[facility]   °[address]     °[place], [place] [address]     °[phone]      °Admissions: 8am-3pm M-F ° °[facility] °[address] °,  [place] °336-227-7417 ° °[facility]: Residential Program (90 Days)   °[place], [place]      °336-725-8389 or 800-758-6077    ° °[facility] °[place],  °(Walk in Hours over the weekend or by referral) ° °[facility] °[address], [place],  [address] °([phone] ° °Crisis Mobile: [facility]:  1-877-626-1772 (for crisis response 24 hours a day) °[facility] Hotline:      1-800-256-2452 °Outpatient Psychiatry and Counseling ° °[facility]: Mobile Crisis   Management 24 hours:  1-877-626-1772 ° °[facility] sliding scale fee and walk in schedule: M-F 8am-12pm/1pm-3pm °[address]  °[place], [place] [address] °[phone] ° °[facility] °[address] °[place], [place] [address] °336-703-9650 ° °[facility] ([facility])- new patient walk-in appointments available Monday - Friday 8am -3pm.          °[address] °[place], [place] [address] °336-676-6840 or crisis line- 336-676-6905 ° °[facility] Intensive Outpatient Therapy Program °[address] °[place], [place] [address] °336-832-9804 ° °[facility]                  °Crisis Services      °[phone]      °[address]     °[place], [place] [address]                ° °[facility]   °[facility] °[phone] °[address] °[place], [place] [address] ° ° °[facility]          °[address]  °[place], [place] [address]       °([phone] ° °[facility] °[address] °[place], [place] [address] °336-292-1510 ° °[facility]    °[address]    °[place], [place] [address]     °[phone]      ° °Parish McKinney, MD     °[address]     °[place] [address]     °336-282-1251     °  °[facility] °[address] °[place] [address] ° °[facility]     °[address]     °[place], [place]      °336-542-2076      ° °[facility] °Shamsher Ahluwalia, MD °[address] °[place], [place] [address] °[phone] ° °[facility]     °[address]     °[place], [place] [address]     °336-274-1237      ° °[facility] °[address] °[place], [place] [address] °336-854-4450 °Resources for Temporary Residential Assistance/Crisis Centers ° °DAY CENTERS °[facility] °M-F 8am-3pm   °[address] [place], [place] [address]   336-332-0824 °Services include: laundry, barbering, support groups, case management, phone  & computer access, showers, AA/NA mtgs, mental health/substance abuse nurse, job skills class, disability information, VA assistance, spiritual classes, etc.  ° °HOMELESS SHELTERS ° °[facility]     °[facility]   °[address], [place]     °[phone]       °       °[facility] (women and children)       °[address] °, [place] [address] °336-275-0820 °[email or web address] for application and process °Application Required ° °[facility]   °[address]    °[place] [address]     °[phone]       °             °[facility] °[address] °, [place] [address] °336.273.5572 °336-235-0363(schedule application appt.) °Application Required ° °[facility] (women only)    °[address]     °[place],  [address]     °336-884-1039      °  Intake starts 6pm daily °Need valid ID, SSC, & Police report °[facility] [place] °[address] °[place], [place] °336-881-5420 °Application Required ° °[facility] (men only)     °[address]      °[place], [place]     °[phone]      ° °[facility] °(Pregnant women only) °[address] °[place], [place] °336-275-0206 ° °[facility]      °[address]      °[place], [place] [address]     °[phone]      °       °[facility] °[address] °[place], [place] °336-723-1848 °90 day commitment/SA/Application process ° °[facility](men only)     °[address]     °[place], [place]     °[phone]       °Check-in at 7pm     °       °[facility] °[address] °[place], [place] [address] °336-248-6684 °Men/Women/Women and Children must be there by 7 pm ° °[facility] °[place], [place] °336-722-8721                ° °

## 2018-02-23 NOTE — ED Provider Notes (Signed)
Patient has been deemed appropriate for discharge by behavioral health.  Labs reviewed.  Will discharge home    Zadie RhineWickline, Jolea Dolle, MD 02/23/18 [id number]

## 2019-05-03 DIAGNOSIS — F4310 Post-traumatic stress disorder, unspecified: Secondary | ICD-10-CM | POA: Insufficient documentation

## 2020-01-16 ENCOUNTER — Ambulatory Visit (HOSPITAL_COMMUNITY)
Admission: EM | Admit: 2020-01-16 | Discharge: 2020-01-16 | Disposition: A | Discharge status: Home / Self Care | Payer: BLUE CROSS/BLUE SHIELD | Attending: Family Medicine | Admitting: Family Medicine

## 2020-01-16 ENCOUNTER — Other Ambulatory Visit: Payer: Self-pay

## 2020-01-16 ENCOUNTER — Encounter (HOSPITAL_COMMUNITY): Payer: Self-pay

## 2020-01-16 DIAGNOSIS — M5441 Lumbago with sciatica, right side: Secondary | ICD-10-CM

## 2020-01-16 MED ORDER — HYDROCODONE-ACETAMINOPHEN 5-325 MG PO TABS: 1.0000 | ORAL_TABLET | Freq: Four times a day (QID) | ORAL | 0 refills | Status: DC | PRN

## 2020-01-16 MED ORDER — PREDNISONE 10 MG (21) PO TBPK: ORAL_TABLET | Freq: Every day | ORAL | 0 refills | Status: DC

## 2020-01-16 NOTE — ED Provider Notes (Signed)
[facility]   [id number] 01/16/20 Arrival Time: 1145  ASSESSMENT & PLAN:  1. Acute right-sided low back pain with right-sided sciatica      Able to ambulate here and hemodynamically stable. No indication for imaging of back at this time given no trauma and normal neurological exam. Discussed.   Meds ordered this encounter  Medications  . HYDROcodone-acetaminophen (NORCO/VICODIN) 5-325 MG tablet    Sig: Take 1 tablet by mouth every 6 (six) hours as needed for moderate pain or severe pain.    Dispense:  8 tablet    Refill:  0  . predniSONE (STERAPRED UNI-PAK 21 TAB) 10 MG (21) TBPK tablet    Sig: Take by mouth daily. Take as directed.    Dispense:  21 tablet    Refill:  0    Medication sedation precautions given. Encourage ROM/movement as tolerated.  Recommend:  Follow-up Information    Schedule an appointment as soon as possible for a visit  with [facility].   Contact information: [address] [place] [address] [phone]       [facility] EMERGENCY DEPARTMENT.   Specialty: Emergency Medicine Why: If symptoms worsen in any way. Contact information: [address] [id number] mc [place] [address] [phone]              [place] Controlled Substances Registry consulted for this patient. I feel the risk/benefit ratio today is favorable for proceeding with this prescription for a controlled substance. Medication sedation precautions given.   Reviewed expectations re: course of current medical issues. Questions answered. Outlined signs and symptoms indicating need for more acute intervention. Patient verbalized understanding. After Visit Summary given.   SUBJECTIVE: History from: patient.  Jose Mitchell is a 39 y.o. male who presents with complaint of intermittent right sided lower back discomfort. Onset gradual. First noted 2 d ago. Injury/trama: no.  History of back problems requiring medical care: frequent; same symptoms; same location. Pain described as sharp and stabbing and with radiation down back of right leg.  Aggravating factors: certain movements and prolonged walking/standing. Alleviating factors: have not been identified. Progressive LE weakness or saddle anesthesia: none. Extremity sensation changes or weakness: none. Ambulatory without difficulty. Normal bowel/bladder habits: yes; without urinary retention. Normal PO intake without n/v. No associated abdominal pain/n/v. Self treatment: has NSAID, with no relief.  Reports no chronic steroid use, fevers, IV drug use, or recent back surgeries or procedures.    OBJECTIVE:  Vitals:   01/16/20 1243  BP: 124/85  Pulse: 66  Resp: 14  Temp: 98.5 F (36.9 C)  SpO2: 100%    General appearance: alert; no distress HEENT: [place]; AT Neck: supple with FROM; without midline tenderness CV: regular Lungs: unlabored respirations; speaks full sentences without difficulty Abdomen: soft, non-tender; non-distended Back: moderate and poorly localized tenderness to palpation over R lumbar region; FROM at waist; bruising: none; without midline tenderness Extremities: without edema; symmetrical without gross deformities; normal ROM of RLE Skin: warm and dry Neurologic: normal gait; normal sensation and strength of RLE Psychological: alert and cooperative; normal mood and affect    Allergies  Allergen Reactions  . Septra [Sulfamethoxazole-Trimethoprim]     Childhood allergy. Does not know reaction    Past Medical History:  Diagnosis Date  . Alcohol abuse   . Bipolar 1 disorder (HCC)   . Cocaine abuse (HCC)   . Schizophrenia [facility])    Social History   Socioeconomic History  .  Marital status: Single    Spouse name: Not on file  . Number of children: Not on file  . Years of education: Not on file  . Highest education level: Not on file  Occupational History  . Not on file  Tobacco  Use  . Smoking status: Current Every Day Smoker    Packs/day: 1.00    Types: Cigarettes  . Smokeless tobacco: Current User    Types: Chew  Vaping Use  . Vaping Use: Never used  Substance and Sexual Activity  . Alcohol use: Not Currently    Comment: daily  . Drug use: Yes    Types: Cocaine  . Sexual activity: Not on file  Other Topics Concern  . Not on file  Social History Narrative  . Not on file   Social Determinants of Health   Financial Resource Strain:   . Difficulty of Paying Living Expenses:   Food Insecurity:   . Worried About Charity fundraiser in the Last Year:   . Arboriculturist in the Last Year:   Transportation Needs:   . Film/video editor (Medical):   Marland Kitchen Lack of Transportation (Non-Medical):   Physical Activity:   . Days of Exercise per Week:   . Minutes of Exercise per Session:   Stress:   . Feeling of Stress :   Social Connections:   . Frequency of Communication with Friends and Family:   . Frequency of Social Gatherings with Friends and Family:   . Attends Religious Services:   . Active Member of Clubs or Organizations:   . Attends Archivist Meetings:   Marland Kitchen Marital Status:   Intimate Partner Violence:   . Fear of Current or Ex-Partner:   . Emotionally Abused:   Marland Kitchen Physically Abused:   . Sexually Abused:    History reviewed. No pertinent family history. History reviewed. No pertinent surgical history.   Vanessa Kick, MD 01/16/20 1304

## 2020-01-16 NOTE — Discharge Instructions (Signed)

## 2020-01-16 NOTE — ED Triage Notes (Signed)
Patient reports a flare up of chronic back pain x2 days. Reports yesterday he was unable to move d/t pain. States the pain shoots down right leg.

## 2020-01-25 ENCOUNTER — Other Ambulatory Visit: Payer: Self-pay

## 2020-01-25 ENCOUNTER — Ambulatory Visit (INDEPENDENT_AMBULATORY_CARE_PROVIDER_SITE_OTHER): Payer: Self-pay | Admitting: Family Medicine

## 2020-01-25 VITALS — BP 110/82 | Ht 74.0 in | Wt 195.0 lb

## 2020-01-25 DIAGNOSIS — M5441 Lumbago with sciatica, right side: Secondary | ICD-10-CM

## 2020-01-25 MED ORDER — AMITRIPTYLINE HCL 50 MG PO TABS
50.0000 mg | ORAL_TABLET | Freq: Every day | ORAL | 1 refills | Status: DC
Start: 2020-01-25 — End: 2020-02-29

## 2020-01-25 NOTE — Patient Instructions (Addendum)
Thank you for coming in to see [place] today! Please see below to review our plan for today's visit:  1.  We are prescribing you amitriptyline/Elavil 50 mg to be taken once daily every night before bedtime.  This medication can help relieve the nerve pain associated with your low back pain. 2.  We encourage you to lift with your legs and not with your low back. 3.  Please come back and see [place] in 3-4 weeks for follow-up for your low back and sciatic nerve pain. 4. We strongly encourage you to cut back and eventually quit smoking.  Please call the clinic at [phone] if your symptoms worsen or you have any concerns. It was our pleasure to serve you!    Dr. Denny Levy Dr. Peggyann Shoals [facility]   Sciatica  Sciatica is pain, weakness, tingling, or loss of feeling (numbness) along the sciatic nerve. The sciatic nerve starts in the lower back and goes down the back of each leg. Sciatica usually goes away on its own or with treatment. Sometimes, sciatica may come back (recur). What are the causes? This condition happens when the sciatic nerve is pinched or has pressure put on it. This may be the result of:  A disk in between the bones of the spine bulging out too far (herniated disk).  Changes in the spinal disks that occur with aging.  A condition that affects a muscle in the butt.  Extra bone growth near the sciatic nerve.  A break (fracture) of the area between your hip bones (pelvis).  Pregnancy.  Tumor. This is rare. What increases the risk? You are more likely to develop this condition if you:  Play sports that put pressure or stress on the spine.  Have poor strength and ease of movement (flexibility).  Have had a back injury in the past.  Have had back surgery.  Sit for long periods of time.  Do activities that involve bending or lifting over and over again.  Are very overweight (obese). What are the signs or symptoms? Symptoms can vary from  mild to very bad. They may include:  Any of these problems in the lower back, leg, hip, or butt: ? Mild tingling, loss of feeling, or dull aches. ? Burning sensations. ? Sharp pains.  Loss of feeling in the back of the calf or the sole of the foot.  Leg weakness.  Very bad back pain that makes it hard to move. These symptoms may get worse when you cough, sneeze, or laugh. They may also get worse when you sit or stand for long periods of time. How is this treated? This condition often gets better without any treatment. However, treatment may include:  Changing or cutting back on physical activity when you have pain.  Doing exercises and stretching.  Putting ice or heat on the affected area.  Medicines that help: ? To relieve pain and swelling. ? To relax your muscles.  Shots (injections) of medicines that help to relieve pain, irritation, and swelling.  Surgery. Follow these instructions at home: Medicines  Take over-the-counter and prescription medicines only as told by your doctor.  Ask your doctor if the medicine prescribed to you: ? Requires you to avoid driving or using heavy machinery. ? Can cause trouble pooping (constipation). You may need to take these steps to prevent or treat trouble pooping:  Drink enough fluids to keep your pee (urine) pale yellow.  Take over-the-counter or prescription medicines.  Eat foods  that are high in fiber. These include beans, whole grains, and fresh fruits and vegetables.  Limit foods that are high in fat and sugar. These include fried or sweet foods. Managing pain      If told, put ice on the affected area. ? Put ice in a plastic bag. ? Place a towel between your skin and the bag. ? Leave the ice on for 20 minutes, 2-3 times a day.  If told, put heat on the affected area. Use the heat source that your doctor tells you to use, such as a moist heat pack or a heating pad. ? Place a towel between your skin and the heat  source. ? Leave the heat on for 20-30 minutes. ? Remove the heat if your skin turns bright red. This is very important if you are unable to feel pain, heat, or cold. You may have a greater risk of getting burned. Activity   Return to your normal activities as told by your doctor. Ask your doctor what activities are safe for you.  Avoid activities that make your symptoms worse.  Take short rests during the day. ? When you rest for a long time, do some physical activity or stretching between periods of rest. ? Avoid sitting for a long time without moving. Get up and move around at least one time each hour.  Exercise and stretch regularly, as told by your doctor.  Do not lift anything that is heavier than 10 lb (4.5 kg) while you have symptoms of sciatica. ? Avoid lifting heavy things even when you do not have symptoms. ? Avoid lifting heavy things over and over.  When you lift objects, always lift in a way that is safe for your body. To do this, you should: ? Bend your knees. ? Keep the object close to your body. ? Avoid twisting. General instructions  Stay at a healthy weight.  Wear comfortable shoes that support your feet. Avoid wearing high heels.  Avoid sleeping on a mattress that is too soft or too hard. You might have less pain if you sleep on a mattress that is firm enough to support your back.  Keep all follow-up visits as told by your doctor. This is important. Contact a doctor if:  You have pain that: ? Wakes you up when you are sleeping. ? Gets worse when you lie down. ? Is worse than the pain you have had in the past. ? Lasts longer than 4 weeks.  You lose weight without trying. Get help right away if:  You cannot control when you pee (urinate) or poop (have a bowel movement).  You have weakness in any of these areas and it gets worse: ? Lower back. ? The area between your hip bones. ? Butt. ? Legs.  You have redness or swelling of your back.  You have  a burning feeling when you pee. Summary  Sciatica is pain, weakness, tingling, or loss of feeling (numbness) along the sciatic nerve.  This condition happens when the sciatic nerve is pinched or has pressure put on it.  Sciatica can cause pain, tingling, or loss of feeling (numbness) in the lower back, legs, hips, and butt.  Treatment often includes rest, exercise, medicines, and putting ice or heat on the affected area. This information is not intended to replace advice given to you by your health care provider. Make sure you discuss any questions you have with your health care provider. Document Revised: 07/31/2018 Document Reviewed: 07/31/2018 Elsevier Patient  Education  2020 Elsevier Inc.  

## 2020-01-25 NOTE — Progress Notes (Signed)
  Jose Mitchell - 39 y.o. male MRN [id number]  Date of birth: 11/28/1980    SUBJECTIVE:    This is a very pleasant patient presenting to the sports medicine clinic for follow-up for progressively worsening low back pain  Chief Complaint:/ HPI:  Low back pain with right leg pain: Patient reports that over the last several years his low back pain has progressively been getting worse.  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  He used to lift weights but has not done so in about 3 years.  Denies having any severe back pains at the time while he was lifting weights.  Most recently he had an episode of severe low back pain when he was getting dressed, putting on his pants.  He was bending forward and putting his right foot into the pant leg when he is felt a sudden severe, sharp pain in his low back with a sensation of lightening going into his right foot.  Reports he couldn't move for about 45 minutes, managed to make his way to bed to lay down.  The pain eased a little with standing upright and on its own after laying down for 45 minutes.  Denies any loss of sensation to the lower extremity or loss of strength.   ROS:     See HPI  PERTINENT  PMH / PSH FH / / SH:  Past Medical, Surgical, Social, and Family History Reviewed & Updated in the EMR.  Pertinent findings include:  Smoking -15-pack-year history   OBJECTIVE: BP 110/82   Ht 6\' 2"  (1.88 m)   Wt 195 lb (88.5 kg)   BMI 25.04 kg/m   Physical Exam:  Vital signs are reviewed.  GEN: Alert and oriented, NAD Pulm: Breathing unlabored PSY: normal mood, congruent affect MSK: Back  Lower Extremities: No gross deformity or scoliosis appreciated, minimal tenderness to palpation inferior and lateral to L5; 5/5 strength with hip abduction and abduction, 5/5 strength with left hip flexion, 4-5/5strength with right hip flexion, 5/5 strength with bilateral knee  extension and flexion, and plantar flexion   ASSESSMENT & PLAN:  1.  Low back pain with associated right-sided sciatica: -Elavil 50 mg at night -Avoid lifting heavy objects in general, but if needs to should use legs and not back -Follow-up 3-4 weeks -Strongly encourage smoking cessation  , DO [facility], PGY-3 01/25/2020 12:06 PM

## 2020-01-26 NOTE — Progress Notes (Signed)
Sports Medicine Center Attending Note: I have seen and examined this patient. I have discussed this patienty with the resident and reviewed the assessment and plan as documented above. I agree with the resident's findings and plan. Long standing low back issues wit failry recent advent of true sciatica. Work up and treatment limited by finances. 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

## 2020-02-04 ENCOUNTER — Other Ambulatory Visit: Payer: Self-pay

## 2020-02-04 ENCOUNTER — Ambulatory Visit (INDEPENDENT_AMBULATORY_CARE_PROVIDER_SITE_OTHER): Payer: No Payment, Other | Admitting: Licensed Clinical Social Worker

## 2020-02-04 DIAGNOSIS — F418 Other specified anxiety disorders: Secondary | ICD-10-CM | POA: Diagnosis not present
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
## 2020-02-16 ENCOUNTER — Other Ambulatory Visit: Payer: Self-pay

## 2020-02-16 ENCOUNTER — Encounter (HOSPITAL_COMMUNITY): Payer: Self-pay | Admitting: Emergency Medicine

## 2020-02-16 ENCOUNTER — Emergency Department (HOSPITAL_COMMUNITY)
Admission: EM | Admit: 2020-02-16 | Discharge: 2020-02-16 | Disposition: A | Discharge status: Home / Self Care | Payer: Self-pay | Attending: Emergency Medicine | Admitting: Emergency Medicine

## 2020-02-16 DIAGNOSIS — S0501XA Injury of conjunctiva and corneal abrasion without foreign body, right eye, initial encounter: Secondary | ICD-10-CM | POA: Insufficient documentation

## 2020-02-16 DIAGNOSIS — X58XXXA Exposure to other specified factors, initial encounter: Secondary | ICD-10-CM | POA: Insufficient documentation

## 2020-02-16 DIAGNOSIS — F1410 Cocaine abuse, uncomplicated: Secondary | ICD-10-CM | POA: Insufficient documentation

## 2020-02-16 DIAGNOSIS — F17210 Nicotine dependence, cigarettes, uncomplicated: Secondary | ICD-10-CM | POA: Insufficient documentation

## 2020-02-16 DIAGNOSIS — T1501XA Foreign body in cornea, right eye, initial encounter: Secondary | ICD-10-CM | POA: Insufficient documentation

## 2020-02-16 DIAGNOSIS — F17220 Nicotine dependence, chewing tobacco, uncomplicated: Secondary | ICD-10-CM | POA: Insufficient documentation

## 2020-02-16 DIAGNOSIS — Y929 Unspecified place or not applicable: Secondary | ICD-10-CM | POA: Insufficient documentation

## 2020-02-16 DIAGNOSIS — Y93H9 Activity, other involving exterior property and land maintenance, building and construction: Secondary | ICD-10-CM | POA: Insufficient documentation

## 2020-02-16 DIAGNOSIS — Y999 Unspecified external cause status: Secondary | ICD-10-CM | POA: Insufficient documentation

## 2020-02-16 DIAGNOSIS — T1591XA Foreign body on external eye, part unspecified, right eye, initial encounter: Secondary | ICD-10-CM

## 2020-02-16 MED ORDER — TETRACAINE HCL 0.5 % OP SOLN
1.0000 [drp] | Freq: Once | OPHTHALMIC | Status: AC
  Administered 2020-02-16: 1 [drp] via OPHTHALMIC
  Filled 2020-02-16: qty 4

## 2020-02-16 MED ORDER — ERYTHROMYCIN 5 MG/GM OP OINT
TOPICAL_OINTMENT | Freq: Once | OPHTHALMIC | Status: AC
  Filled 2020-02-16: qty 3.5

## 2020-02-16 MED ORDER — FLUORESCEIN SODIUM 1 MG OP STRP
1.0000 | ORAL_STRIP | Freq: Once | OPHTHALMIC | Status: AC
  Administered 2020-02-16: 1 via OPHTHALMIC
  Filled 2020-02-16: qty 1

## 2020-02-16 NOTE — Discharge Instructions (Signed)
To your right lower lash line 4 times daily and blank allowing the ointment to wash across your eye.  Continue using this antibiotic for 5 to 7 days or until your symptoms are completely resolved.  Avoid rubbing your eye which can prolong the healing time or make this abrasion worse.  Call Dr. Vonna Kotyk as needed if your symptoms are not completely resolved over the next week.

## 2020-02-16 NOTE — ED Notes (Signed)
EDP in room  

## 2020-02-16 NOTE — ED Notes (Signed)
Supplies to room

## 2020-02-16 NOTE — ED Triage Notes (Signed)
Pt was cutting trees and thinks that a piece of sawdust got in his RIGHT eye.

## 2020-02-17 NOTE — ED Provider Notes (Signed)
[facility] EMERGENCY DEPARTMENT Provider Note   CSN: [id number] Arrival date & time: 02/16/20  2104     History Chief Complaint  Patient presents with  . Foreign Body in Eye    Jose Mitchell is a 39 y.o. male.  The history is provided by the patient.  Foreign Body in Eye This is a new problem. The current episode started 3 to 5 hours ago. The problem occurs constantly. The problem has not changed since onset.Pertinent negatives include no chest pain, no abdominal pain and no shortness of breath. Exacerbated by: blinking and eye movement. Nothing relieves the symptoms. He has tried water for the symptoms. The treatment provided no relief.   Pt had sawdust debris land in his right eye while clearing trees on his fathers property this afternoon. He does not wear contacts or glasses.     Past Medical History:  Diagnosis Date  . Alcohol abuse   . Bipolar 1 disorder (HCC)   . Cocaine abuse (HCC)   . Schizophrenia (HCC)     There are no problems to display for this patient.   History reviewed. No pertinent surgical history.     History reviewed. No pertinent family history.  Social History   Tobacco Use  . Smoking status: Current Every Day Smoker    Packs/day: 1.00    Types: Cigarettes  . Smokeless tobacco: Current User    Types: Chew  Vaping Use  . Vaping Use: Never used  Substance Use Topics  . Alcohol use: Not Currently    Comment: daily  . Drug use: Yes    Types: Cocaine    Home Medications Prior to Admission medications   Medication Sig Start Date End Date Taking? Authorizing Provider  amitriptyline (ELAVIL) 50 MG tablet Take 1 tablet (50 mg total) by mouth at bedtime. 01/25/20   Dollene Cleveland, DO  HYDROcodone-acetaminophen (NORCO/VICODIN) 5-325 MG tablet Take 1 tablet by mouth every 6 (six) hours as needed for moderate pain or severe pain. 01/16/20   Mardella Layman, MD  lamoTRIgine (LAMICTAL) 100 MG tablet Take 100 mg by mouth daily. Pt unsure of  dosage.    [provider]    Allergies    Septra [sulfamethoxazole-trimethoprim]  Review of Systems   Review of Systems  Constitutional: Negative for chills and fever.  HENT: Positive for rhinorrhea. Negative for congestion, ear pain, sinus pressure, sore throat, trouble swallowing and voice change.   Eyes: Positive for photophobia, pain and redness. Negative for discharge and visual disturbance.  Respiratory: Negative for cough, shortness of breath, wheezing and stridor.   Cardiovascular: Negative for chest pain.  Gastrointestinal: Negative for abdominal pain.  Genitourinary: Negative.     Physical Exam Updated Vital Signs BP (!) 145/88 (BP Location: Right Arm)   Pulse 75   Temp 98 F (36.7 C) (Oral)   Resp 17   Ht 6\' 2"  (1.88 m)   Wt (!) 93 kg   SpO2 97%   BMI 26.32 kg/m   Physical Exam Constitutional:      Appearance: He is well-developed.  HENT:     Head: Normocephalic and atraumatic.     Nose: Rhinorrhea present.     Mouth/Throat:     Pharynx: Uvula midline. No oropharyngeal exudate or posterior oropharyngeal erythema.     Tonsils: No tonsillar abscesses.  Eyes:     General: Vision grossly intact.        Right eye: Foreign body present. No discharge.     Conjunctiva/sclera:  Right eye: Right conjunctiva is injected.     Pupils: Pupils are equal, round, and reactive to light.     Right eye: Corneal abrasion and fluorescein uptake present.     Slit lamp exam:    Right eye: Anterior chamber quiet. Photophobia present.     Comments: Oval abrasion right upper cornea at 12 o'clock position.  Lids everted and swiped with cotton swab. Small piece of wood debris lodged mid upper lid conjunctiva which was successfully removed. Clear tearing right eye present. No chemosis  Os/od/ou 20/15  Cardiovascular:     Rate and Rhythm: Normal rate.  Pulmonary:     Effort: Pulmonary effort is normal.  Musculoskeletal:        General: Normal range of motion.   Skin:    General: Skin is warm and dry.     Findings: No rash.  Neurological:     Mental Status: He is alert and oriented to person, place, and time.  Psychiatric:        Mood and Affect: Mood normal.     ED Results / Procedures / Treatments   Labs (all labs ordered are listed, but only abnormal results are displayed) Labs Reviewed - No data to display  EKG None  Radiology No results found.  Procedures Procedures (including critical care time)  Medications Ordered in ED Medications  tetracaine (PONTOCAINE) 0.5 % ophthalmic solution 1 drop (1 drop Left Eye Given 02/16/20 2212)  fluorescein ophthalmic strip 1 strip (1 strip Left Eye Given 02/16/20 2212)  erythromycin ophthalmic ointment ( Right Eye Given 02/16/20 2211)    ED Course  I have reviewed the triage vital signs and the nursing notes.  Pertinent labs & imaging results that were available during my care of the patient were reviewed by me and considered in my medical decision making (see chart for details).    MDM Rules/Calculators/A&P                            Medium corneal abrasion upper right cornea, no corneal laceration present. No corneal retained fb.  Tetanus current.  Erythromycin ointment given with instructions for home tx, outlined need for f/u care for worsening or persistent sx.  Referral to ophthalmology prn if not resolved within 1 week.     Final Clinical Impression(s) / ED Diagnoses Final diagnoses:  Abrasion of right cornea, initial encounter  Foreign body of right eye, initial encounter    Rx / DC Orders ED Discharge Orders    None       Victoriano Lain 02/17/20 1240    Eber Hong, MD 02/20/20 1325

## 2020-02-29 ENCOUNTER — Ambulatory Visit (INDEPENDENT_AMBULATORY_CARE_PROVIDER_SITE_OTHER): Payer: Self-pay | Admitting: Family Medicine

## 2020-02-29 ENCOUNTER — Other Ambulatory Visit: Payer: Self-pay

## 2020-02-29 DIAGNOSIS — M5441 Lumbago with sciatica, right side: Secondary | ICD-10-CM

## 2020-02-29 DIAGNOSIS — M545 Low back pain, unspecified: Secondary | ICD-10-CM

## 2020-02-29 DIAGNOSIS — M79606 Pain in leg, unspecified: Secondary | ICD-10-CM

## 2020-02-29 DIAGNOSIS — M549 Dorsalgia, unspecified: Secondary | ICD-10-CM | POA: Insufficient documentation

## 2020-02-29 HISTORY — DX: Low back pain, unspecified: M54.50

## 2020-02-29 HISTORY — DX: Pain in leg, unspecified: M79.606

## 2020-02-29 MED ORDER — AMITRIPTYLINE HCL 25 MG PO TABS: 25.0000 mg | ORAL_TABLET | Freq: Every day | ORAL | 0 refills | Status: DC

## 2020-02-29 MED ORDER — PREDNISONE 10 MG PO TABS: ORAL_TABLET | ORAL | 0 refills | Status: DC

## 2020-02-29 NOTE — Patient Instructions (Addendum)
It was great to meet you today! Thank you for letting me participate in your care!  Today, we discussed your continued low back pain and we will decrease your amitriptyline to 25mg  at night time. Please take the steroid dose pack as prescribed. Also, please do the exercises I have given you once daily and I will see you in two weeks. If you are not better we will discuss options on how to get an MRI.  Be well, , DO PGY-4, Sports Medicine Fellow [facility]

## 2020-02-29 NOTE — Progress Notes (Signed)
    SUBJECTIVE:   CHIEF COMPLAINT / HPI:   Low back pain Mr. Jose Mitchell is a pleasant 39 year old male who presents today for follow-up due to right-sided low back pain with radicular symptoms going down through his buttock down the back of his right leg to the lateral side when it hits the knee and all the way down to the foot.  He states the pain is constant and has not improved since he was last seen here in our clinic and also states that he gets associated numbness with the right leg below the knee intermittently when the pain shoots all the way down his leg.  He states that he does not always do this but will do so intermittently and it tends to occur when he is more active.  He also states that the pain is more intense when he is active.  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  He states he has stopped taking amitriptyline as it made him feel too groggy in the mornings but it did make him sleepy his recovery is complicated due to not having insurance as due to financial considerations will limit what we are able to offer at this moment.  PERTINENT  PMH / PSH: None listed  OBJECTIVE:   BP 118/88   Ht 6\' 2"  (1.88 m)   Wt 200 lb (90.7 kg)   BMI 25.68 kg/m   MSK: Lumbar spine: - Inspection: no gross deformity or asymmetry, swelling or ecchymosis. No skin changes - Palpation: No TTP over the spinous processes, TTP over the right sided paraspinal muscles of the lumbar rgeion, NO SI joint tenderness b/l - ROM: Limited active ROM due to pain of the lumbar spine in flexion and extension - Strength: 5/5 strength of lower extremity in L4-S1 nerve root distributions b/l - Neuro: sensation intact in the L4-S1 nerve root distribution b/l, 2+ L4 and S1 reflexes; loss of sensation to light touch on the lateral side of the right leg starting at the knee extending down to the top of the  foot   ASSESSMENT/PLAN:   Low back pain radiating down leg Considering he is almost 3 months away from the initial injury and he is not improving at all ideally at this juncture we would order a lumbar spine MRI to get further information as to the cause and nature of his low back pain that is radiating down his leg.  Encouraging that he has no lower extremity weakness or red flag symptoms however it is somewhat concerning that he is reporting a change of sensation down his right leg in the S1 nerve root distribution. - Due to patient not having insurance hand for financial considerations we will not order an MRI at this time. -Decreasing amitriptyline from 50 to 25 mg daily at nighttime - We will attempt a prednisone taper starting at 60 mg daily for 2 days and then decreasing by 10 mg every 2 days until he completes the course and follow-up with after. -If he still continues to have symptoms we could consider an S1 nerve root block and if that relieves his symptoms it would at least give [place] an idea that he does have some kind of S1 nerve root impingement.     [place], DO PGY-4, Sports Medicine Fellow [facility]

## 2020-02-29 NOTE — Assessment & Plan Note (Signed)
Considering he is almost 3 months away from the initial injury and he is not improving at all ideally at this juncture we would order a lumbar spine MRI to get further information as to the cause and nature of his low back pain that is radiating down his leg.  Encouraging that he has no lower extremity weakness or red flag symptoms however it is somewhat concerning that he is reporting a change of sensation down his right leg in the S1 nerve root distribution. - Due to patient not having insurance hand for financial considerations we will not order an MRI at this time. -Decreasing amitriptyline from 50 to 25 mg daily at nighttime - We will attempt a prednisone taper starting at 60 mg daily for 2 days and then decreasing by 10 mg every 2 days until he completes the course and follow-up with [place] after. -If he still continues to have symptoms we could consider an S1 nerve root block and if that relieves his symptoms it would at least give [place] an idea that he does have some kind of S1 nerve root impingement.

## 2020-03-03 ENCOUNTER — Ambulatory Visit (HOSPITAL_COMMUNITY): Payer: Self-pay | Admitting: Psychiatry

## 2020-03-10 ENCOUNTER — Ambulatory Visit (INDEPENDENT_AMBULATORY_CARE_PROVIDER_SITE_OTHER): Payer: No Payment, Other | Admitting: Licensed Clinical Social Worker

## 2020-03-10 ENCOUNTER — Other Ambulatory Visit: Payer: Self-pay

## 2020-03-10 DIAGNOSIS — F418 Other specified anxiety disorders: Secondary | ICD-10-CM | POA: Diagnosis not present
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
## 2020-03-16 ENCOUNTER — Telehealth (HOSPITAL_COMMUNITY): Payer: Self-pay | Admitting: Adult Health

## 2020-03-18 ENCOUNTER — Ambulatory Visit: Payer: Self-pay | Admitting: Sports Medicine

## 2020-03-25 ENCOUNTER — Ambulatory Visit (INDEPENDENT_AMBULATORY_CARE_PROVIDER_SITE_OTHER): Payer: Self-pay | Admitting: Sports Medicine

## 2020-03-25 ENCOUNTER — Other Ambulatory Visit: Payer: Self-pay

## 2020-03-25 VITALS — BP 132/88 | Ht 74.0 in | Wt 200.0 lb

## 2020-03-25 DIAGNOSIS — M79606 Pain in leg, unspecified: Secondary | ICD-10-CM

## 2020-03-25 DIAGNOSIS — M545 Low back pain: Secondary | ICD-10-CM

## 2020-03-25 MED ORDER — DICLOFENAC SODIUM 75 MG PO TBEC: 75.0000 mg | DELAYED_RELEASE_TABLET | Freq: Two times a day (BID) | ORAL | 0 refills | Status: DC | PRN

## 2020-03-25 NOTE — Progress Notes (Signed)
   Subjective:    Patient ID: Jose Mitchell, male    DOB: 1980-08-22, 39 y.o.   MRN: [id number]  HPI   Patient comes in today for follow-up on low back pain and right leg radiculopathy.  He last saw Dr. Jennette Kettle in the office on August 6.  He was placed on a steroid Dosepak which was helpful.  The pain in his leg has improved for the most part but he still has low back pain, particularly with prolonged sitting.  However, he states his symptoms are currently tolerable.    Review of Systems    As above  Objective:   Physical Exam  Well-developed, well-nourished.  No acute distress.  Sitting comfortably in the exam room  Lumbar spine: Full lumbar range of motion.  He does have pain both with forward flexion and extension.  No spasm.  Neurological exam shows full strength in both lower extremities.      Assessment & Plan:   Low back pain likely secondary to bulging lumbar disc  I recommended that the patient try Voltaren 75 mg twice daily with food as needed.  Given his overall improvement, I think we can hold on MRI at this time.  However, if his symptoms once again worsen, then I may need to reconsider this.  Follow-up as needed.

## 2020-04-06 ENCOUNTER — Telehealth (HOSPITAL_COMMUNITY): Payer: Self-pay | Admitting: Adult Health

## 2020-04-07 ENCOUNTER — Other Ambulatory Visit: Payer: Self-pay

## 2020-04-07 ENCOUNTER — Ambulatory Visit (INDEPENDENT_AMBULATORY_CARE_PROVIDER_SITE_OTHER): Payer: No Payment, Other | Admitting: Licensed Clinical Social Worker

## 2020-04-07 DIAGNOSIS — F418 Other specified anxiety disorders: Secondary | ICD-10-CM | POA: Diagnosis not present
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
## 2020-04-08 ENCOUNTER — Telehealth (INDEPENDENT_AMBULATORY_CARE_PROVIDER_SITE_OTHER): Payer: No Payment, Other | Admitting: Psychiatry

## 2020-04-08 ENCOUNTER — Encounter (HOSPITAL_COMMUNITY): Payer: Self-pay | Admitting: Psychiatry

## 2020-04-08 ENCOUNTER — Telehealth (HOSPITAL_COMMUNITY): Payer: Self-pay | Admitting: Licensed Clinical Social Worker

## 2020-04-08 DIAGNOSIS — F319 Bipolar disorder, unspecified: Secondary | ICD-10-CM

## 2020-04-08 DIAGNOSIS — F315 Bipolar disorder, current episode depressed, severe, with psychotic features: Secondary | ICD-10-CM | POA: Diagnosis not present

## 2020-04-08 DIAGNOSIS — F3181 Bipolar II disorder: Secondary | ICD-10-CM

## 2020-04-08 DIAGNOSIS — G2581 Restless legs syndrome: Secondary | ICD-10-CM | POA: Insufficient documentation

## 2020-04-08 HISTORY — DX: Bipolar disorder, unspecified: F31.9

## 2020-04-08 HISTORY — DX: Bipolar II disorder: F31.81

## 2020-04-08 HISTORY — DX: Restless legs syndrome: G25.81

## 2020-04-08 MED ORDER — RISPERIDONE 2 MG PO TABS: 2.0000 mg | ORAL_TABLET | Freq: Every day | ORAL | 2 refills | Status: DC

## 2020-04-08 MED ORDER — LAMOTRIGINE 150 MG PO TABS: 150.0000 mg | ORAL_TABLET | Freq: Every day | ORAL | 2 refills | Status: DC

## 2020-04-08 MED ORDER — GABAPENTIN 600 MG PO TABS: 600.0000 mg | ORAL_TABLET | Freq: Every day | ORAL | 2 refills | Status: DC

## 2020-04-08 NOTE — Progress Notes (Signed)
Psychiatric Initial Adult Assessment  Virtual Visit via Video Note  I connected with Jose Mitchell on 04/08/20 at  8:00 AM EDT by a video enabled telemedicine application and verified that I am speaking with the correct person using two identifiers.  Location: Patient: Home Provider: Clinic   I discussed the limitations of evaluation and management by telemedicine and the availability of in person appointments. The patient expressed understanding and agreed to proceed.  I provided 45 minutes of non-face-to-face time during this encounter.     Patient Identification: Jose Mitchell MRN:  [id number] Date of Evaluation:  04/08/2020 Referral Source: [facility] Complaint:  "I need my medications. I took the last dose yesterday" Visit Diagnosis:    ICD-10-CM   1. Restless leg  G25.81 gabapentin (NEURONTIN) 600 MG tablet  2. Bipolar 2 disorder, major depressive episode (HCC)  F31.81 lamoTRIgine (LAMICTAL) 150 MG tablet    risperiDONE (RISPERDAL) 2 MG tablet    History of Present Illness:  39 year old male seen to day for initial psychiatric evaluation. He was referred to outpatient psychiatry by [facility] for medication management. He has a psychiatric history of PTSD, Bipolar affective disorder, Bipolar 1, alcohol use (in remission), cocaine use (in remission), and cannabis use (in remission).  He is currently being managed on Lamictal 100 mg daily and Risperdal 2 mg nightly. He notes that his medications are somewhat effective in managing his psychiatric conditions.  Today patient is well groomed, cooperative, engaged  in conversation, and maintained eye contact. He describes his mood as depressed and endorces anhedonia, psychomotor agitation, problems concentrating, anxiety, and disturbed sleep. He notes he gets 8 hours of sleep however notes that he wakes up at least twice a night. Patient also informed provider that he has restless legs which interrupts his sleep and activities  during the day. Patent endorses symptoms of mania such as distractibility, fluctuations in mood, racing thoughts, irritability, spending money impulsively, and AH ([place] and often hear things that others don't hear). He denies SI/HI/VH or paranoia.   Patient is agreeable to starting Gabapentin 600 mg daily to help manage restless legs. He is also agreeable to increase Lamictal 100 mg to 150 mg to help stabilize mood. He will continue all other medications as prescribed. Potential side effects of medication and risks vs benefits of treatment vs non-treatment were explained and discussed. All questions were answered. No other concerns noted at this time.       Associated Signs/Symptoms: Depression Symptoms:  depressed mood, anhedonia, psychomotor agitation, difficulty concentrating, anxiety, panic attacks, disturbed sleep, (Hypo) Manic Symptoms:  Distractibility, Elevated Mood, Flight of Ideas, Licensed conveyancer, Impulsivity, Irritable Mood, Anxiety Symptoms:  Denies Psychotic Symptoms:  Hallucinations: Auditory PTSD Symptoms: NA  Past Psychiatric History:  PTSD, Bipolar affective disorder, Bipolar 1, alcohol use (in remission), cocaine use (in remission), and cannabis use (in remission).  Previous Psychotropic Medications: Patient notes that he has taken other psychiatric medications however he can not recall their names.   Substance Abuse History in the last 12 months:  Yes.    Consequences of Substance Abuse: NA  Past Medical History:  Past Medical History:  Diagnosis Date  . Alcohol abuse   . Bipolar 1 disorder (HCC)   . Cocaine abuse (HCC)   . Schizophrenia (HCC)    No past surgical history on file.  Family Psychiatric History: Denies  Family History: No family history on file.  Social History:   Social History  Socioeconomic History  . Marital status: Single    Spouse name: Not on file  . Number of children: Not on file  . Years  of education: Not on file  . Highest education level: Not on file  Occupational History  . Not on file  Tobacco Use  . Smoking status: Current Every Day Smoker    Packs/day: 1.00    Types: Cigarettes  . Smokeless tobacco: Current User    Types: Chew  Vaping Use  . Vaping Use: Never used  Substance and Sexual Activity  . Alcohol use: Not Currently    Comment: daily  . Drug use: Yes    Types: Cocaine  . Sexual activity: Not on file  Other Topics Concern  . Not on file  Social History Narrative  . Not on file   Social Determinants of Health   Financial Resource Strain:   . Difficulty of Paying Living Expenses: Not on file  Food Insecurity:   . Worried About Programme researcher, broadcasting/film/video in the Last Year: Not on file  . Ran Out of Food in the Last Year: Not on file  Transportation Needs:   . Lack of Transportation (Medical): Not on file  . Lack of Transportation (Non-Medical): Not on file  Physical Activity:   . Days of Exercise per Week: Not on file  . Minutes of Exercise per Session: Not on file  Stress:   . Feeling of Stress : Not on file  Social Connections:   . Frequency of Communication with Friends and Family: Not on file  . Frequency of Social Gatherings with Friends and Family: Not on file  . Attends Religious Services: Not on file  . Active Member of Clubs or Organizations: Not on file  . Attends Banker Meetings: Not on file  . Marital Status: Not on file    Additional Social History: Patient resides in [place]. He is single and has no children. He currently is self employed doing lawn care. He denies alcohol, tobacco(quite one month ago), or illegal drug use.   Allergies:   Allergies  Allergen Reactions  . Septra [Sulfamethoxazole-Trimethoprim]     Childhood allergy. Does not know reaction    Metabolic Disorder Labs: No results found for: HGBA1C, MPG No results found for: PROLACTIN No results found for: CHOL, TRIG, HDL, CHOLHDL, VLDL,  LDLCALC No results found for: TSH  Therapeutic Level Labs: No results found for: LITHIUM No results found for: CBMZ No results found for: VALPROATE  Current Medications: Current Outpatient Medications  Medication Sig Dispense Refill  . gabapentin (NEURONTIN) 600 MG tablet Take 1 tablet (600 mg total) by mouth daily. 30 tablet 2  . lamoTRIgine (LAMICTAL) 150 MG tablet Take 1 tablet (150 mg total) by mouth daily. Pt unsure of dosage. 30 tablet 2  . risperiDONE (RISPERDAL) 2 MG tablet Take 1 tablet (2 mg total) by mouth at bedtime. 60 tablet 2   No current facility-administered medications for this visit.    Musculoskeletal: Strength & Muscle Tone: Unable to assess due telehealth visit Gait & Station: Unable to assess due telehealth visit Patient leans: N/A  Psychiatric Specialty Exam: Review of Systems  There were no vitals taken for this visit.There is no height or weight on file to calculate BMI.  General Appearance: Well Groomed  Eye Contact:  Good  Speech:  Clear and Coherent and Normal Rate  Volume:  Normal  Mood:  Anxious and Depressed  Affect:  Congruent  Thought Process:  Coherent, Goal Directed and Linear  Orientation:  Full (Time, Place, and Person)  Thought Content:  WDL and Logical  Suicidal Thoughts:  No  Homicidal Thoughts:  No  Memory:  Immediate;   Good Recent;   Good Remote;   Good  Judgement:  Good  Insight:  Good  Psychomotor Activity:  Normal  Concentration:  Concentration: Good and Attention Span: Good  Recall:  Good  Fund of Knowledge:Good  Language: Good  Akathisia:  No  Handed:  Right  AIMS (if indicated):  Not done  Assets:  Communication Skills Desire for Improvement Financial Resources/Insurance Housing Social Support  ADL's:  Intact  Cognition: WNL  Sleep:  Good   Screenings:   Assessment and Plan: Patient endorses symptoms of mania, depression, and anxiety. He also informed Clinical research associate that he is also having restless leg and is  agreeable to starting Gabapentin 600 mg daily. He is also agreeable to increase Lamictal 100 mg to 150 mg to help stabilize mood. He will continue all other medications as prescribed.   1. Restless leg  Start- gabapentin (NEURONTIN) 600 MG tablet; Take 1 tablet (600 mg total) by mouth daily.  Dispense: 30 tablet; Refill: 2  2. Bipolar 2 disorder, major depressive episode (HCC)  Increased- lamoTRIgine (LAMICTAL) 150 MG tablet; Take 1 tablet (150 mg total) by mouth daily. Pt unsure of dosage.  Dispense: 30 tablet; Refill: 2 Continiue- risperiDONE (RISPERDAL) 2 MG tablet; Take 1 tablet (2 mg total) by mouth at bedtime.  Dispense: 60 tablet; Refill: 2  Follow up in 2 months   Shanna Cisco, NP 9/14/20219:17 AM

## 2020-05-05 ENCOUNTER — Other Ambulatory Visit: Payer: Self-pay

## 2020-05-05 ENCOUNTER — Ambulatory Visit (INDEPENDENT_AMBULATORY_CARE_PROVIDER_SITE_OTHER): Payer: No Payment, Other | Admitting: Licensed Clinical Social Worker

## 2020-05-05 DIAGNOSIS — F418 Other specified anxiety disorders: Secondary | ICD-10-CM | POA: Diagnosis not present

## 2020-05-06 NOTE — Progress Notes (Signed)
   THERAPIST PROGRESS NOTE  Session Time: 40 min  Participation Level: Active  Behavioral Response: CasualAlertEuthymic  Type of Therapy: Individual Therapy  Treatment Goals addressed: Coping  Interventions: Motivational Interviewing and Supportive   Summary: Jose Mitchell is a 39 y.o. male who presents with hx of anx/dep. This date pt comes for in person session per his preference. Pt is in a positive mood. He reports he is working hard and feels his mood is well managed at this time. He states he is not having any irritable outbursts, he is sleeping well and "not letting things bother me". He states he is not arguing with father even if father becomes irritable. LCSW assessed for how recent med management appt went. Pt states "It was alright". Pt acknowledges med changes. LCSW assessed for pt's use of meds as prescribed. Pt reports he is not taking any meds and at this time does not intend to since he believes he is managing well without meds. Pt states he feels "too sluggish" when he takes meds and he does not like to feel this way. Went on to assess this in some detail. He is ultimately not open to adjustments or trying anything different. He reports he would like med management services to be discontinued at the present time. LCSW advised scheduling staff of same at close of session. Pt reports the girl in his home remains in the home. He states he has clarified with her they are not a couple. He states he knows she does not have anywhere to go saying she is from [place], but he has now been told by girl that her dtr is coming from [place] to get her. He is realistic this may not be the case. He states "She stays on her side of the house and I stay on mine". He agrees he still feels taken advantage of but plans to let things be as they are for the time being. Pt reports he has stopped smoking and denies cravings. He is proud of this accomplishement. LCSW commended pt for decision and follow through  expounding on benefits he will reap. 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 LCSW provided education and referral on [facility] in [place]. Pt states intention to f/u with them. LCSW reviewed coping strategies and poc prior to close of session. Pt states "You can do anything with the help of the good Lord". He advises he is going to church every Wynelle Link and during the week. Using deep breathing and positive distraction with pets prn. Pt states appreciation for care.   Suicidal/Homicidal: Nowithout intent/plan  Therapist Response: Pt remains receptive to counseling. He is declining further med management for now.    See notes, Rock.  Plan: Return again in 4 weeks.  Diagnosis: Axis I: Anxeity with depression    Axis II: Deferred  Callaway Sink, LCSW 05/06/2020

## 2020-05-26 ENCOUNTER — Ambulatory Visit: Payer: Self-pay | Admitting: Family Medicine

## 2020-05-30 ENCOUNTER — Ambulatory Visit: Payer: Self-pay | Admitting: Family Medicine

## 2020-06-06 DIAGNOSIS — F200 Paranoid schizophrenia: Secondary | ICD-10-CM | POA: Insufficient documentation

## 2020-06-06 HISTORY — DX: Paranoid schizophrenia: F20.0

## 2020-06-09 ENCOUNTER — Ambulatory Visit (INDEPENDENT_AMBULATORY_CARE_PROVIDER_SITE_OTHER): Payer: No Payment, Other | Admitting: Licensed Clinical Social Worker

## 2020-06-09 ENCOUNTER — Other Ambulatory Visit: Payer: Self-pay

## 2020-06-09 DIAGNOSIS — F418 Other specified anxiety disorders: Secondary | ICD-10-CM

## 2020-06-11 NOTE — Progress Notes (Signed)
   THERAPIST PROGRESS NOTE  Session Time: 45 min  Participation Level: Active  Behavioral Response: CasualAlertEuthymic  Type of Therapy: Individual Therapy  Treatment Goals addressed: Communication: Anx/Dep/Coping  Interventions: Motivational Interviewing and Supportive  Summary: Jose Mitchell is a 39 y.o. male who presents with hx of anx/dep. This date pt comes for in person session per his preference. He is noted to be limping walking back to clinician's office. Pt advises he made a simple wrong move as he was getting out of his car a few weeks ago and threw his back out. He reports this was initially so painful he could not get out of bed. He states it is slowly getting better with ibuprofen. 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 Pt continues to be free of smoking and is proud of this accomplishment. LCSW commended pt and provided encouragement to continue. He states he feels his mood is "good". Revisited topic of med management, which pt declines at this time. Assessed for paranoia, hallucinations. Pt denies hallucinations. He states he does "occasionally" feel some paranoia but he distracts himself and this works for him. Pt reports he did go to [facility] when asked but they have in person restrictions r/t COVID. He advises he called and lvm but no one has called him back yet. LCSW assessed for thoughts/feelings re holidays. Pt states he and his fam usually get together for holidays at brother's home in [place] or at aunt's home. He states he does not yet know what the plans are for this year. He denies being stressed by the holiday season. Woman in his home remains. This date pt states her father is supposed to moving into a new home and she is going to live with him. He denies this relationship is a stressor now that they essentially stay apart with exception of driving her to work 2 times a wk, pt's mother takes her 3 times a wk. Pt denies other  worries/concerns. LCSW introduced gratefulness project pt agrees to try. LCSW reviewed poc including scheduling with pt's verbal acknowledgement. Pt states appreciation for care.     Suicidal/Homicidal: Nowithout intent/plan  Therapist Response: Pt remains receptive to care.  Plan: Return again in 4 weeks.  Diagnosis: Axis I: Anxeity with depression    Axis II: Deferred  August Sink, LCSW 06/11/2020

## 2020-06-16 ENCOUNTER — Telehealth (HOSPITAL_COMMUNITY): Payer: No Payment, Other | Admitting: Psychiatry

## 2020-07-07 ENCOUNTER — Ambulatory Visit (HOSPITAL_COMMUNITY): Payer: No Payment, Other | Admitting: Licensed Clinical Social Worker

## 2020-07-14 ENCOUNTER — Ambulatory Visit (INDEPENDENT_AMBULATORY_CARE_PROVIDER_SITE_OTHER): Payer: No Payment, Other | Admitting: Licensed Clinical Social Worker

## 2020-07-14 ENCOUNTER — Other Ambulatory Visit: Payer: Self-pay

## 2020-07-14 DIAGNOSIS — F418 Other specified anxiety disorders: Secondary | ICD-10-CM

## 2020-07-15 NOTE — Progress Notes (Signed)
   THERAPIST PROGRESS NOTE  Session Time: 41  Participation Level: Active  Behavioral Response: CasualAlertEuthymic  Type of Therapy: Individual Therapy  Treatment Goals addressed: Communication: Dep/Anx/Coping  Interventions: Motivational Interviewing and Supportive  Summary: Jose Mitchell is a 39 y.o. male who presents with hx of anx/dep. This date pt comes for in person session per his preference. Pt walking much better and states his back has slowly improved. He has returned to baseline yet states he realizes his back could go out at any time. 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 LCSW assessed for s&s of anx/dep. Pt states "I'm fine". He says if he starts to feel any symptoms coming on he distracts himself with busy work. He continues to use pets to help with coping. Today he speaks about also caring for cows and again mentions goats. Pt denies any angry outbursts with father. He states they have not been arguing lately and pt feels he has better self control if father does start to argue. LCSW assessed for outcome of Thanksgiving and pending Christmas holidays. Pt provides updates. Denies stress r/t holidays. Pt remains free of cigarettes when asked. He states "I am never going to smoke again". He denies any cravings at this point. Pt reports he will be getting blow device off of his vehicle January 20th after having it for 3 yrs. Pt will no longer be paying for device. LCSW assessed for pt thoughts on sustaining the $70 per mon since used to payment and saving for retirement. Pt states he is in favor of the idea and may decide to do so. LCSW assessed for contact with [facility]. Pt reports he did not hear from anyone and plans to f/u in the new year. Pt denies other worries/concerns. LCSW reviewed coping strategies and poc prior to close of session. Pt states appreciation for care.      Suicidal/Homicidal: Nowithout intent/plan  Therapist Response: Pt remains receptive to care.  Plan: Return again in 3 weeks.  Diagnosis: Axis I: Anxiety/depression    Axis II: Deferred  Spry Sink, LCSW 07/15/2020

## 2020-07-23 ENCOUNTER — Telehealth (HOSPITAL_COMMUNITY): Payer: Self-pay | Admitting: Licensed Clinical Social Worker

## 2020-07-23 NOTE — Telephone Encounter (Signed)
LCSW called pt this morning to respond to communication he left on this clinician's private practice phone. It is suspected he got this number over the Internet. Pt did not answer call. LVM with details about how to reach this clinician at [facility] and to never use the number for clinician's private practice in the future. Requested call back to determine what his needs are.

## 2020-07-28 ENCOUNTER — Ambulatory Visit (HOSPITAL_COMMUNITY): Payer: No Payment, Other | Admitting: Licensed Clinical Social Worker

## 2020-09-08 ENCOUNTER — Ambulatory Visit (HOSPITAL_COMMUNITY): Payer: No Payment, Other | Admitting: Licensed Clinical Social Worker

## 2020-09-09 ENCOUNTER — Telehealth (HOSPITAL_COMMUNITY): Payer: Self-pay | Admitting: Licensed Clinical Social Worker

## 2020-09-09 NOTE — Telephone Encounter (Signed)
LCSW returned phone message left on office vm on 09/04/20 as this clinician out of the office until today. Call went to vm. LCSW left detailed message advising pt of receipt of his vm asking to placed back on LCSW's schedule. LCSW advised of next avail appt on 4/11 at 9a. Left call back # should he have a conflict and need to reschedule. Reminded of 24/7 crisis unit at this facility to use prn.

## 2020-09-29 ENCOUNTER — Other Ambulatory Visit: Payer: Self-pay

## 2020-09-29 ENCOUNTER — Ambulatory Visit (INDEPENDENT_AMBULATORY_CARE_PROVIDER_SITE_OTHER): Payer: 59 | Admitting: Sports Medicine

## 2020-09-29 ENCOUNTER — Ambulatory Visit (INDEPENDENT_AMBULATORY_CARE_PROVIDER_SITE_OTHER): Payer: 59

## 2020-09-29 VITALS — BP 122/78 | Ht 74.0 in | Wt 220.0 lb

## 2020-09-29 DIAGNOSIS — M79606 Pain in leg, unspecified: Secondary | ICD-10-CM

## 2020-09-29 DIAGNOSIS — M545 Low back pain, unspecified: Secondary | ICD-10-CM | POA: Diagnosis not present

## 2020-09-29 MED ORDER — DICLOFENAC SODIUM 75 MG PO TBEC: DELAYED_RELEASE_TABLET | ORAL | 0 refills | Status: DC

## 2020-09-29 NOTE — Progress Notes (Addendum)
   Subjective:    Patient ID: Jose Mitchell, male    DOB: 1981/02/23, 40 y.o.   MRN: [id number]  HPI Jose Mitchell comes in today with returning right-sided low back pain.  He was last seen in the office in August 2021.  Sterapred Dosepak was temporarily helpful.  However, he has significant pain with prolonged sitting.  He is having difficulty working due to his pain.  He will get occasional radiating pain down the right leg.  Some days, his pain is severe enough that he is bedridden.  He has not had any imaging to date.   Review of Systems As above    Objective:   Physical Exam  Developed, well nourished.  No acute distress  Lumbar spine: Patient has tenderness to palpation diffusely along the right side of the lower lumbar spine.  No spasm.  Pain with lumbar flexion.  Pain improved with extension.  Negative straight leg raise.  Good strength in both lower extremities.      Assessment & Plan:   Persistent right-sided low back pain-rule out bulging lumbar disc  Patient's pain is to the point that we need to get a little more aggressive with treatment.  He would like to avoid further prednisone so we will try Voltaren 75 mg twice daily as needed for pain.  We will also get an x-ray of his lumbar spine.  After reviewing the x-ray, I may need to proceed with an MRI to rule out a bulging lumbar disc.  We will call him with these results when available.  Addendum: X-rays are unremarkable.  Proceed with MRI as scheduled.

## 2020-09-29 NOTE — Patient Instructions (Signed)
[facility] [address] [place],  [place]  [address] [phone]

## 2020-10-05 ENCOUNTER — Ambulatory Visit (INDEPENDENT_AMBULATORY_CARE_PROVIDER_SITE_OTHER): Payer: 59

## 2020-10-05 ENCOUNTER — Other Ambulatory Visit: Payer: Self-pay

## 2020-10-05 DIAGNOSIS — M545 Low back pain, unspecified: Secondary | ICD-10-CM

## 2020-10-05 DIAGNOSIS — M5416 Radiculopathy, lumbar region: Secondary | ICD-10-CM

## 2020-10-05 DIAGNOSIS — M79606 Pain in leg, unspecified: Secondary | ICD-10-CM | POA: Diagnosis not present

## 2020-10-06 ENCOUNTER — Telehealth: Payer: Self-pay | Admitting: Sports Medicine

## 2020-10-06 DIAGNOSIS — M545 Low back pain, unspecified: Secondary | ICD-10-CM

## 2020-10-06 DIAGNOSIS — M79606 Pain in leg, unspecified: Secondary | ICD-10-CM

## 2020-10-06 NOTE — Telephone Encounter (Signed)
  I spoke with the patient on the phone today after reviewing the MRI ordered of his lumbar spine.  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  I recommend surgical consultation.  I would like to refer the patient to Dr. Yevette Edwards if possible.  I will defer further work-up and treatment to the discretion of Dr. Yevette Edwards and the patient will follow up with me as needed.

## 2020-10-06 NOTE — Addendum Note (Signed)
Addended by: Rutha Bouchard E on: 10/06/2020 10:54 AM   Modules accepted: Orders

## 2020-10-06 NOTE — Telephone Encounter (Signed)
Pt's insurance not accepted at [facility]. Will refer him to Dr. Otelia Sergeant at [facility]. Pt is aware.

## 2020-11-03 ENCOUNTER — Ambulatory Visit (INDEPENDENT_AMBULATORY_CARE_PROVIDER_SITE_OTHER): Payer: No Payment, Other | Admitting: Licensed Clinical Social Worker

## 2020-11-03 ENCOUNTER — Other Ambulatory Visit: Payer: Self-pay

## 2020-11-03 DIAGNOSIS — F418 Other specified anxiety disorders: Secondary | ICD-10-CM

## 2020-11-10 ENCOUNTER — Other Ambulatory Visit: Payer: Self-pay | Admitting: *Deleted

## 2020-11-10 MED ORDER — DICLOFENAC SODIUM 75 MG PO TBEC: DELAYED_RELEASE_TABLET | ORAL | 0 refills | Status: DC

## 2020-11-11 ENCOUNTER — Telehealth (HOSPITAL_COMMUNITY): Payer: Self-pay

## 2020-11-11 NOTE — Telephone Encounter (Signed)
Appointment - Telephone call with patient, to follow up on several concerns expressed to this Clinical Nurse Manager, that patient has made statements or actions that are not therapeutic in nature with current individual therapist. After discussions with current therapist and Director of [facility], agreed for patient to be offered a reassignment to another therapist at this site or offered alternative resources at [facility] or the [facility].  Patient stated agreement with plan to transition away from working with current therapist to help with maintaining a therapeutic relationship and meeting his goals for therapy.  Patient stated he would like a little time to decide if he would like to return to the [facility] to see a male therapist here or to be referred to another practice as he stated "I can't really talk about it right now" but does think he needs to see someone else.  Agreed to call patient back in the coming week to discuss options and to go ahead and cancel current therapy appointments arranged at the [facility] outpatient at this time.

## 2020-11-11 NOTE — Progress Notes (Signed)
   THERAPIST PROGRESS NOTE  Session Time: 40 min.  Participation Level: Active  Behavioral Response: CasualAlertAnxious and Depressed  Type of Therapy: Individual Therapy  Treatment Goals addressed: Anger and Communication: Dep/Anx/Coping  Interventions: Solution Focused and Supportive  Summary: Jose Mitchell is a 40 y.o. male who presents with hx of anxiety/depression. This date pt comes for in person session. He has not been seen since 07/14/21. Gap in care addressed. LCSW readdressed the vm left for pt by this clinician on 07/23/20 after he called this clinician's private practice phone over the Christmas weekend. Pt acknowledges he got the message about not using that number. Pt given card with number to reach this clinician at [facility] only with front desk number, [phone]. Pt verbalizes understanding. LCSW assessed for pt overall mood, anxiety, depression. Pt states "It comes and goes". Assessed for paranoia and hallucinations and pt states "Not bad". He reports he distracts himself if this if a problem. LCSW reassessed med management providing education. Pt ultimately declines saying "If it gets worse I'll let you know". LCSW assessed for any substance use, which pt denies. He states he did get the blow device off of his vehicle in Jan and is pleased with this coming to an end. Pt denies any cravings for use of substances. Pt brings up the "crazy girl" who has been living with him for some time. He reports new c/o and says she has new deadline to get out by June 7th. He states his parents now want her out too and the home belongs to parents. Pt also vents about being irritable/angry with a situation at work and ending up saying disrespectful comments to his employer. He provides details. He appears to be annoyed by boss giving him direction to perform his work a certain way when pt wants to do it his way. Reviewed normal boss/employee relations and expectations. LCSW assessed for pt attending  anger management classes to assist him r/t coping with anger. Pt states "I probably need to". LCSW advised of changes to services r/t COVID and would need to find out what [facility] is providing at this time. LCSW assessed for other worries/concerns. Pt reports he had MRI on back and has f/u with orthopedic surgeon on 4/21. Pt reports feeling anxious about appt and possible surgery. Recommended pt make list of his questions/concerns prior to appt. Pt asks for some help with what questions to ask, which was provided. LCSW reviewed poc including scheduling prior to close of session. Pt states appreciation for care.        Suicidal/Homicidal: Nowithout intent/plan  Therapist Response: Transition care.  Plan: LCSW consulted with leadership re concerns pt crossing boundaries, inappropriate attachment to this clinician d/t a series of behaviors and comments. Everlene Balls, Clinical Dir at [facility] will address concerns with pt by phone and offer options to transition care to another clinician or refer out per discussion held 11/11/20.  Diagnosis: Axis I: Anx with Dep  Edgeley Sink, LCSW 11/11/2020

## 2020-11-13 ENCOUNTER — Ambulatory Visit: Payer: 59 | Admitting: Specialist

## 2020-11-17 NOTE — Telephone Encounter (Signed)
Appointment - Telephone message left for pt as this Clinical Nurse Manager called back as previously agreed with pt from 11/11/20.  Left return phone number of [phone] for pt to call back to discuss his choice in treatment options.

## 2020-11-19 NOTE — Telephone Encounter (Signed)
Appointment - Telephone message left for patient again as he has not called this Clinical Nurse Manager back to discuss changes in therapists and his choices for treatment options.  Informed patient on message, we would be mailing a letter to him with provider options, addresses and phone numbers for other individual therapists within the local community and requested patient call this Clinical Nurse Manager back if questions or concerns with options as any of patient's future appointments with Marybelle Killings at the [facility] have been cancelled at this time as previously discussed and informed to patient.    Treament appointments - Patient called this Clinical Nurse Manager back today and discussed outside agency options for therapy.  Patient agreed with plan to close out his outpatient servnces and therapy at the [facility].  Stated desire for this Nurse Manager to send him other possible treatment options within the [facility] area by letter today and he will call this Nurse Manager back if any problems finding a new provider or with other agencies.  Patient stated understanding he is not being restricted from still utilizing the [facility], located at this facility, or any other [facility] if he has any such needs.  Patient in agreement with plan for ending individual therapy at the [facility] at this time. Letter prepared with other therapy options noted and mailed.  Copy sent to scan.

## 2020-12-01 ENCOUNTER — Ambulatory Visit (HOSPITAL_COMMUNITY): Payer: Self-pay | Admitting: Licensed Clinical Social Worker

## 2020-12-23 ENCOUNTER — Other Ambulatory Visit: Payer: Self-pay

## 2020-12-23 MED ORDER — DICLOFENAC SODIUM 75 MG PO TBEC: DELAYED_RELEASE_TABLET | ORAL | 0 refills | Status: DC

## 2020-12-23 NOTE — Progress Notes (Signed)
Pt called asking for a refill on diclofenac for his back pain.

## 2020-12-29 ENCOUNTER — Ambulatory Visit (HOSPITAL_COMMUNITY): Payer: No Payment, Other | Admitting: Licensed Clinical Social Worker

## 2021-02-26 ENCOUNTER — Other Ambulatory Visit: Payer: Self-pay

## 2021-02-26 ENCOUNTER — Ambulatory Visit: Payer: Self-pay

## 2021-02-26 ENCOUNTER — Ambulatory Visit (INDEPENDENT_AMBULATORY_CARE_PROVIDER_SITE_OTHER): Payer: 59 | Admitting: Specialist

## 2021-02-26 ENCOUNTER — Encounter: Payer: Self-pay | Admitting: Specialist

## 2021-02-26 VITALS — BP 123/79 | HR 65 | Ht 74.0 in | Wt 220.0 lb

## 2021-02-26 DIAGNOSIS — M79606 Pain in leg, unspecified: Secondary | ICD-10-CM

## 2021-02-26 DIAGNOSIS — M545 Low back pain, unspecified: Secondary | ICD-10-CM

## 2021-02-26 DIAGNOSIS — M5126 Other intervertebral disc displacement, lumbar region: Secondary | ICD-10-CM | POA: Diagnosis not present

## 2021-02-26 MED ORDER — PREGABALIN 75 MG PO CAPS: 75.0000 mg | ORAL_CAPSULE | Freq: Two times a day (BID) | ORAL | 0 refills | Status: DC

## 2021-02-26 NOTE — Patient Instructions (Signed)
Avoid bending, stooping and avoid lifting weights greater than 10 lbs. Avoid prolong standing and walking. Avoid frequent bending and stooping  No lifting greater than 10 lbs. May use ice or moist heat for pain. Weight loss is of benefit. Handicap license is approved. Dr. Newton's secretary/Assistant will call to arrange for epidural steroid injection  

## 2021-02-26 NOTE — Progress Notes (Signed)
Office Visit Note   Patient: Jose Mitchell           Date of Birth: 05-Jun-1981           MRN: [id number] Visit Date: 02/26/2021              Requested by: Ralene Cork, DO [address] [place],  [place] [address] PCP: Patient, No Pcp Per (Inactive)   Assessment & Plan: Visit Diagnoses:  1. Low back pain radiating down leg   2. HNP (herniated nucleus pulposus), lumbar     Plan: Avoid bending, stooping and avoid lifting weights greater than 10 lbs. Avoid prolong standing and walking. Avoid frequent bending and stooping  No lifting greater than 10 lbs. May use ice or moist heat for pain. Weight loss is of benefit. Handicap license is approved. Dr. Hiawatha Blas secretary/Assistant will call to arrange for epidural steroid injection  Follow-Up Instructions: Return in about 3 weeks (around 03/19/2021).   Orders:  Orders Placed This Encounter  Procedures   XR Lumb Spine Flex&Ext Only   No orders of the defined types were placed in this encounter.     Procedures: No procedures performed   Clinical Data: No additional findings.   Subjective: Chief Complaint  Patient presents with   Lower Back - Pain   Right Leg - Pain    40 year old male with one year history of back pain and upper buttock right greater left. No tingling or numbness, pain is episodic and when it occurs it is incapcitating. He relates pain is greater with bending and stooping and lifting. 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 Working off and on for about 2 years. No bowel or bladder difficulty. He has trouble walking a mile, walking one time around the appartment complex and he starts to hurt. He will use golf carts and tractors to get around. Weed eater he was painful, can't stay upright.    Review of Systems  Constitutional: Negative.  Negative for activity change, appetite change, chills, diaphoresis, fatigue, fever and unexpected weight change.  HENT: Negative.  Negative for  congestion, dental problem, drooling, ear discharge, ear pain, facial swelling, hearing loss, mouth sores, nosebleeds, postnasal drip, rhinorrhea, sinus pressure, sinus pain, sneezing, sore throat, tinnitus, trouble swallowing and voice change.   Eyes: Negative.  Negative for photophobia, pain, discharge, redness, itching and visual disturbance.  Respiratory: Negative.  Negative for apnea, cough, choking, chest tightness, shortness of breath, wheezing and stridor.   Cardiovascular: Negative.  Negative for chest pain, palpitations and leg swelling.  Gastrointestinal: Negative.  Negative for abdominal distention, abdominal pain, anal bleeding, blood in stool, constipation, diarrhea, nausea, rectal pain and vomiting.  Endocrine: Negative.  Negative for cold intolerance, heat intolerance, polydipsia, polyphagia and polyuria.  Genitourinary: Negative.  Negative for difficulty urinating, dysuria, enuresis, flank pain, frequency, genital sores and hematuria.  Musculoskeletal:  Positive for back pain and gait problem. Negative for arthralgias, joint swelling, myalgias and neck pain.  Skin:  Negative for color change, pallor, rash and wound.  Allergic/Immunologic: Negative.   Neurological:  Positive for dizziness, seizures, facial asymmetry, light-headedness, numbness and headaches.  Hematological: Negative.   Psychiatric/Behavioral: Negative.    All other systems reviewed and are negative.   Objective: Vital Signs: BP 123/79 (BP Location: Left Arm, Patient Position: Sitting)   Pulse 65   Ht 6\' 2"  (1.88 m)   Wt 220 lb (99.8 kg)   BMI 28.25 kg/m   Physical Exam Constitutional:  Appearance: He is well-developed.  HENT:     Head: Normocephalic and atraumatic.  Eyes:     Pupils: Pupils are equal, round, and reactive to light.  Pulmonary:     Effort: Pulmonary effort is normal.     Breath sounds: Normal breath sounds.  Abdominal:     General: Bowel sounds are normal.     Palpations: Abdomen  is soft.  Musculoskeletal:        General: Normal range of motion.     Cervical back: Normal range of motion and neck supple.  Skin:    General: Skin is warm and dry.  Neurological:     Mental Status: He is alert and oriented to person, place, and time.  Psychiatric:        Behavior: Behavior normal.        Thought Content: Thought content normal.        Judgment: Judgment normal.    Ortho Exam  Specialty Comments:  No specialty comments available.  Imaging: No results found.   PMFS History: Patient Active Problem List   Diagnosis Date Noted   Restless leg 04/08/2020   Bipolar 1 disorder, depressed (HCC) 04/08/2020   Bipolar 2 disorder, major depressive episode (HCC) 04/08/2020   Low back pain radiating down leg 02/29/2020   Past Medical History:  Diagnosis Date   Alcohol abuse    Bipolar 1 disorder (HCC)    Cocaine abuse (HCC)    Schizophrenia (HCC)     History reviewed. No pertinent family history.  History reviewed. No pertinent surgical history. Social History   Occupational History   Not on file  Tobacco Use   Smoking status: Every Day    Packs/day: 1.00    Types: Cigarettes   Smokeless tobacco: Current    Types: Chew  Vaping Use   Vaping Use: Never used  Substance and Sexual Activity   Alcohol use: Not Currently    Comment: daily   Drug use: Yes    Types: Cocaine   Sexual activity: Not on file

## 2021-03-04 ENCOUNTER — Telehealth: Payer: Self-pay | Admitting: Physical Medicine and Rehabilitation

## 2021-03-04 NOTE — Telephone Encounter (Signed)
Patient called needing to schedule an appointment with Dr Alvester Morin for an epidural injection. The number to contact patient is [phone]

## 2021-03-06 ENCOUNTER — Other Ambulatory Visit: Payer: Self-pay

## 2021-03-06 ENCOUNTER — Ambulatory Visit (HOSPITAL_COMMUNITY)
Admission: EM | Admit: 2021-03-06 | Discharge: 2021-03-06 | Disposition: A | Discharge status: Home / Self Care | Payer: 59 | Attending: Student | Admitting: Student

## 2021-03-06 ENCOUNTER — Encounter (HOSPITAL_COMMUNITY): Payer: Self-pay

## 2021-03-06 DIAGNOSIS — S0501XA Injury of conjunctiva and corneal abrasion without foreign body, right eye, initial encounter: Secondary | ICD-10-CM

## 2021-03-06 MED ORDER — EYE WASH OPHTH SOLN
OPHTHALMIC | Status: AC
  Filled 2021-03-06: qty 118

## 2021-03-06 MED ORDER — CIPROFLOXACIN HCL 0.3 % OP SOLN: 1.0000 [drp] | OPHTHALMIC | 0 refills | Status: DC

## 2021-03-06 MED ORDER — FLUORESCEIN SODIUM 1 MG OP STRP
ORAL_STRIP | OPHTHALMIC | Status: AC
  Filled 2021-03-06: qty 3

## 2021-03-06 MED ORDER — TETRACAINE HCL 0.5 % OP SOLN
OPHTHALMIC | Status: AC
  Filled 2021-03-06: qty 4

## 2021-03-06 NOTE — ED Provider Notes (Signed)
[facility]    CSN: [id number] Arrival date & time: 03/06/21  0807      History   Chief Complaint Chief Complaint  Patient presents with   Foreign Body in Eye    HPI Jose Mitchell is a 40 y.o. male presenting with sensation of foreign body in R eye x2 days, improving, following getting grass in eyes while mowing lawn. Medical history bipolar, schizophrenia, alcohol abuse, cocaine abuse, 3 corneal abrasions in the past. R eye foreign body sensation. Has flushed his eyes already. Wears sunglasses while doing lawnwork but does not wear protective eyewear. Wears glasses for reading but not contacts.  Denies photophobia, eye redness, eye crusting in the morning, eye pain with movement, vision changes, double vision, excessive tearing, burning eyes   HPI  Past Medical History:  Diagnosis Date   Alcohol abuse    Bipolar 1 disorder (HCC)    Cocaine abuse (HCC)    Schizophrenia (HCC)     Patient Active Problem List   Diagnosis Date Noted   Restless leg 04/08/2020   Bipolar 1 disorder, depressed (HCC) 04/08/2020   Bipolar 2 disorder, major depressive episode (HCC) 04/08/2020   Low back pain radiating down leg 02/29/2020    History reviewed. No pertinent surgical history.     Home Medications    Prior to Admission medications   Medication Sig Start Date End Date Taking? Authorizing Provider  ciprofloxacin (CILOXAN) 0.3 % ophthalmic solution Place 1 drop into the right eye every 4 (four) hours while awake. Administer 1 drop, every 4 hours, while awake, for 7 days. 03/06/21  Yes Rhys Martini, PA-C  diclofenac (VOLTAREN) 75 MG EC tablet Take one tab twice daily with food as needed for back pain. 12/23/20   Ralene Cork, DO  lamoTRIgine (LAMICTAL) 150 MG tablet Take 1 tablet (150 mg total) by mouth daily. Pt unsure of dosage. 04/08/20   Shanna Cisco, NP  pregabalin (LYRICA) 75 MG capsule Take 1 capsule (75 mg total) by mouth 2 (two) times daily. 02/26/21    Kerrin Champagne, MD  risperiDONE (RISPERDAL) 2 MG tablet Take 1 tablet (2 mg total) by mouth at bedtime. 04/08/20   Shanna Cisco, NP    Family History Family History  Family history unknown: Yes    Social History Social History   Tobacco Use   Smoking status: Every Day    Packs/day: 1.00    Types: Cigarettes   Smokeless tobacco: Current    Types: Chew  Vaping Use   Vaping Use: Never used  Substance Use Topics   Alcohol use: Not Currently    Comment: daily   Drug use: Yes    Types: Cocaine     Allergies   Septra [sulfamethoxazole-trimethoprim]   Review of Systems Review of Systems  Eyes:  Positive for pain. Negative for photophobia, discharge, redness, itching and visual disturbance.  All other systems reviewed and are negative.   Physical Exam Triage Vital Signs ED Triage Vitals  Enc Vitals Group     BP 03/06/21 0918 132/86     Pulse Rate 03/06/21 0918 (!) 59     Resp 03/06/21 0916 18     Temp 03/06/21 0916 98 F (36.7 C)     Temp Source 03/06/21 0916 Oral     SpO2 03/06/21 0916 99 %     Weight --      Height --      Head Circumference --      Peak  Flow --      Pain Score 03/06/21 0918 4     Pain Loc --      Pain Edu? --      Excl. in GC? --    No data found.  Updated Vital Signs BP 132/86   Pulse (!) 59   Temp 98 F (36.7 C) (Oral)   Resp 18   SpO2 99%   Visual Acuity Right Eye Distance: 20/20 Left Eye Distance: 20/30 -1 Bilateral Distance: 20/20 -1  Right Eye Near:   Left Eye Near:    Bilateral Near:     Physical Exam Vitals reviewed.  Constitutional:      Appearance: Normal appearance.  HENT:     Head: Normocephalic and atraumatic.     Right Ear: Tympanic membrane, ear canal and external ear normal. There is no impacted cerumen.     Left Ear: Tympanic membrane, ear canal and external ear normal. There is no impacted cerumen.     Nose: Nose normal. No congestion.     Mouth/Throat:     Pharynx: Oropharynx is clear. No  posterior oropharyngeal erythema.  Eyes:     General: Lids are normal. Lids are everted, no foreign bodies appreciated. Vision grossly intact. Gaze aligned appropriately. No visual field deficit.       Right eye: No foreign body, discharge or hordeolum.        Left eye: No foreign body, discharge or hordeolum.     Extraocular Movements: Extraocular movements intact.     Right eye: Normal extraocular motion and no nystagmus.     Left eye: Normal extraocular motion and no nystagmus.     Conjunctiva/sclera:     Right eye: Right conjunctiva is injected. No chemosis, exudate or hemorrhage.    Left eye: Left conjunctiva is not injected. No chemosis, exudate or hemorrhage.    Pupils: Pupils are equal, round, and reactive to light.     Right eye: Corneal abrasion and fluorescein uptake present. Seidel exam negative.     Left eye: No corneal abrasion or fluorescein uptake. Seidel exam negative.    Visual Fields: Right eye visual fields normal and left eye visual fields normal.     Comments: R conjunctival injection above iris. Corresponding fluorescein uptake. PERRLA, EOMI without pain. No orbital tenderness. Visual acuity intact.   Cardiovascular:     Rate and Rhythm: Normal rate and regular rhythm.     Heart sounds: Normal heart sounds.  Pulmonary:     Effort: Pulmonary effort is normal.     Breath sounds: Normal breath sounds.  Neurological:     General: No focal deficit present.     Mental Status: He is alert.  Psychiatric:        Mood and Affect: Mood normal.        Behavior: Behavior normal.        Thought Content: Thought content normal.        Judgment: Judgment normal.     UC Treatments / Results  Labs (all labs ordered are listed, but only abnormal results are displayed) Labs Reviewed - No data to display  EKG   Radiology No results found.  Procedures Procedures (including critical care time)  Medications Ordered in UC Medications - No data to display  Initial  Impression / Assessment and Plan / UC Course  I have reviewed the triage vital signs and the nursing notes.  Pertinent labs & imaging results that were available during my care of the patient  were reviewed by me and considered in my medical decision making (see chart for details).     This patient is a very pleasant 40 y.o. year old male presenting with R corneal abrasion. Visual acuity intact. Wears glasses not contacts. Trimethoprim allergy; Ciprofloxacin drops sent. Advised him to wear protective eyewear in future. ED return precautions discussed. Patient verbalizes understanding and agreement.    Final Clinical Impressions(s) / UC Diagnoses   Final diagnoses:  Abrasion of right cornea, initial encounter     Discharge Instructions      -Ciprofloxacin drops 1 drop every 4 hours while awake for 7 days.  -Warm compresses, over-the-counter lubricating drops for additional relief -Seek additional medical attention if symptoms are getting worse instead better, like vision changes, worsening of eye pain, eye crusting in the morning, etc.     ED Prescriptions     Medication Sig Dispense Auth. Provider   ciprofloxacin (CILOXAN) 0.3 % ophthalmic solution Place 1 drop into the right eye every 4 (four) hours while awake. Administer 1 drop, every 4 hours, while awake, for 7 days. 5 mL Rhys Martini, PA-C      PDMP not reviewed this encounter.   Rhys Martini, PA-C 03/06/21 1134

## 2021-03-06 NOTE — Discharge Instructions (Addendum)
-  Ciprofloxacin drops 1 drop every 4 hours while awake for 7 days.  -Warm compresses, over-the-counter lubricating drops for additional relief -Seek additional medical attention if symptoms are getting worse instead better, like vision changes, worsening of eye pain, eye crusting in the morning, etc.

## 2021-03-06 NOTE — ED Triage Notes (Signed)
Pt presents with possible foreign body in right eye X 2 days.

## 2021-03-23 ENCOUNTER — Ambulatory Visit: Payer: 59 | Admitting: Physical Medicine and Rehabilitation

## 2021-03-23 ENCOUNTER — Encounter: Payer: Self-pay | Admitting: Adult Health

## 2021-03-23 ENCOUNTER — Other Ambulatory Visit: Payer: Self-pay

## 2021-03-26 ENCOUNTER — Ambulatory Visit: Payer: 59 | Admitting: Specialist

## 2021-04-20 ENCOUNTER — Ambulatory Visit: Payer: 59 | Admitting: Specialist

## 2021-04-22 ENCOUNTER — Ambulatory Visit: Payer: 59 | Admitting: Specialist

## 2021-04-27 ENCOUNTER — Ambulatory Visit: Payer: Self-pay

## 2021-04-27 ENCOUNTER — Encounter: Payer: Self-pay | Admitting: Physical Medicine and Rehabilitation

## 2021-04-27 ENCOUNTER — Other Ambulatory Visit: Payer: Self-pay | Admitting: Specialist

## 2021-04-27 ENCOUNTER — Ambulatory Visit (INDEPENDENT_AMBULATORY_CARE_PROVIDER_SITE_OTHER): Payer: 59 | Admitting: Physical Medicine and Rehabilitation

## 2021-04-27 ENCOUNTER — Other Ambulatory Visit: Payer: Self-pay

## 2021-04-27 VITALS — BP 113/77 | HR 76

## 2021-04-27 DIAGNOSIS — M5416 Radiculopathy, lumbar region: Secondary | ICD-10-CM | POA: Diagnosis not present

## 2021-04-27 MED ORDER — BETAMETHASONE SOD PHOS & ACET 6 (3-3) MG/ML IJ SUSP
12.0000 mg | Freq: Once | INTRAMUSCULAR | Status: AC
  Administered 2021-04-27: 12 mg

## 2021-04-27 MED ORDER — PREGABALIN 75 MG PO CAPS: 75.0000 mg | ORAL_CAPSULE | Freq: Two times a day (BID) | ORAL | 0 refills | Status: DC

## 2021-04-27 NOTE — Progress Notes (Signed)
Jose Mitchell - 40 y.o. male MRN [id number]  Date of birth: 07-25-1981  Office Visit Note: Visit Date: 04/27/2021 PCP: Patient, No Pcp Per (Inactive) Referred by: Kerrin Champagne, MD  Subjective: Chief Complaint  Patient presents with   Lower Back - Pain   HPI:  Jose Mitchell is a 40 y.o. male who comes in today at the request of Dr. Vira Browns for planned Right L5-S1 Lumbar Interlaminar epidural steroid injection with fluoroscopic guidance.  The patient has failed conservative care including home exercise, medications, time and activity modification.  This injection will be diagnostic and hopefully therapeutic.  Please see requesting physician notes for further details and justification. MRI reviewed with images and spine model.  MRI reviewed in the note below.     ROS Otherwise per HPI.  Assessment & Plan: Visit Diagnoses:    ICD-10-CM   1. Lumbar radiculopathy  M54.16 XR C-ARM NO REPORT    Epidural Steroid injection    betamethasone acetate-betamethasone sodium phosphate (CELESTONE) injection 12 mg      Plan: No additional findings.   Meds & Orders:  Meds ordered this encounter  Medications   betamethasone acetate-betamethasone sodium phosphate (CELESTONE) injection 12 mg    Orders Placed This Encounter  Procedures   XR C-ARM NO REPORT   Epidural Steroid injection    Follow-up: Return for Vira Browns, MD as scheduled.   Procedures: No procedures performed  Lumbar Epidural Steroid Injection - Interlaminar Approach with Fluoroscopic Guidance  Patient: Jose Mitchell      Date of Birth: Jun 26, 1981 MRN: [id number] PCP: Patient, No Pcp Per (Inactive)      Visit Date: 04/27/2021   Universal Protocol:     Consent Given By: the patient  Position: PRONE  Additional Comments: Vital signs were monitored before and after the procedure. Patient was prepped and draped in the usual sterile fashion. The correct patient, procedure, and site was verified.   Injection  Procedure Details:   Procedure diagnoses: Lumbar radiculopathy [M54.16]   Meds Administered:  Meds ordered this encounter  Medications   betamethasone acetate-betamethasone sodium phosphate (CELESTONE) injection 12 mg     Laterality: Right  Location/Site:  L5-S1  Needle: 3.5 in., 20 ga. Tuohy  Needle Placement: Paramedian epidural  Findings:   -Comments: Excellent flow of contrast into the epidural space.  Procedure Details: Using a paramedian approach from the side mentioned above, the region overlying the inferior lamina was localized under fluoroscopic visualization and the soft tissues overlying this structure were infiltrated with 4 ml. of 1% Lidocaine without Epinephrine. The Tuohy needle was inserted into the epidural space using a paramedian approach.   The epidural space was localized using loss of resistance along with counter oblique bi-planar fluoroscopic views.  After negative aspirate for air, blood, and CSF, a 2 ml. volume of Isovue-250 was injected into the epidural space and the flow of contrast was observed. Radiographs were obtained for documentation purposes.    The injectate was administered into the level noted above.   Additional Comments:  The patient tolerated the procedure well Dressing: 2 x 2 sterile gauze and Band-Aid    Post-procedure details: Patient was observed during the procedure. Post-procedure instructions were reviewed.  Patient left the clinic in stable condition.    Clinical History: MRI LUMBAR SPINE WITHOUT CONTRAST   TECHNIQUE: Multiplanar, multisequence MR imaging of the lumbar spine was performed. No intravenous contrast was administered.   COMPARISON:  Lumbar radiographs September 29, 2020.   FINDINGS: Segmentation:  Standard.   Alignment: No substantial sagittal subluxation. Broad dextrocurvature.   Vertebrae: Vertebral body heights are maintained. No specific evidence of acute fracture, discitis/osteomyelitis, or  suspicious bone lesion.   Conus medullaris and cauda equina: Conus extends to the L1-L2 level. Conus appears normal.   Paraspinal and other soft tissues: Unremarkable   Disc levels:   T12-L1: No significant disc protrusion, foraminal stenosis, or canal stenosis.   L1-L2: No significant disc protrusion, foraminal stenosis, or canal stenosis.   L2-L3: No significant disc protrusion, foraminal stenosis, or canal stenosis.   L3-L4: Small disc bulge and mild bilateral facet hypertrophy with ligamentum flavum thickening. Prominent dorsal epidural fat. Mild bilateral subarticular recess stenosis without significant central canal or foraminal stenosis.   L4-L5: Broad disc bulge with mild bilateral facet hypertrophy and ligamentum flavum thickening. Prominent dorsal epidural fat. Resulting mild canal and bilateral subarticular recess stenosis. Mild left foraminal stenosis. No significant right foraminal stenosis.   L5-S1: Large right subarticular disc protrusion with resulting severe right subarticular recess stenosis and suspected impingement of the descending right S1 nerve roots (series 5 and 6, image 40). No significant central canal stenosis or foraminal stenosis.   IMPRESSION: 1. At L5-S1 a large right subarticular disc protrusion results in severe right subarticular recess and suspected impingement of the descending right S1 nerve roots. 2. Prominent dorsal epidural fat and degenerative change results in mild canal stenosis at L4-L5. Also, mild left foraminal stenosis at this level.     Electronically Signed   By: Feliberto Harts MD   On: 10/06/2020 07:20     Objective:  VS:  HT:    WT:   BMI:     BP:113/77  HR:76bpm  TEMP: ( )  RESP:  Physical Exam Vitals and nursing note reviewed.  Constitutional:      General: He is not in acute distress.    Appearance: Normal appearance. He is not ill-appearing.  HENT:     Head: Normocephalic and atraumatic.      Right Ear: External ear normal.     Left Ear: External ear normal.     Nose: No congestion.  Eyes:     Extraocular Movements: Extraocular movements intact.  Cardiovascular:     Rate and Rhythm: Normal rate.     Pulses: Normal pulses.  Pulmonary:     Effort: Pulmonary effort is normal. No respiratory distress.  Abdominal:     General: There is no distension.     Palpations: Abdomen is soft.  Musculoskeletal:        General: No tenderness or signs of injury.     Cervical back: Neck supple.     Right lower leg: No edema.     Left lower leg: No edema.     Comments: Patient has good distal strength without clonus.  Skin:    Findings: No erythema or rash.  Neurological:     General: No focal deficit present.     Mental Status: He is alert and oriented to person, place, and time.     Sensory: No sensory deficit.     Motor: No weakness or abnormal muscle tone.     Coordination: Coordination normal.  Psychiatric:        Mood and Affect: Mood normal.        Behavior: Behavior normal.     Imaging: No results found.

## 2021-04-27 NOTE — Procedures (Signed)
Lumbar Epidural Steroid Injection - Interlaminar Approach with Fluoroscopic Guidance  Patient: Jose Mitchell      Date of Birth: [birth year] MRN: [id number] PCP: Patient, No Pcp Per (Inactive)      Visit Date: 04/27/2021   Universal Protocol:     Consent Given By: the patient  Position: PRONE  Additional Comments: Vital signs were monitored before and after the procedure. Patient was prepped and draped in the usual sterile fashion. The correct patient, procedure, and site was verified.   Injection Procedure Details:   Procedure diagnoses: Lumbar radiculopathy [M54.16]   Meds Administered:  Meds ordered this encounter  Medications   betamethasone acetate-betamethasone sodium phosphate (CELESTONE) injection 12 mg     Laterality: Right  Location/Site:  L5-S1  Needle: 3.5 in., 20 ga. Tuohy  Needle Placement: Paramedian epidural  Findings:   -Comments: Excellent flow of contrast into the epidural space.  Procedure Details: Using a paramedian approach from the side mentioned above, the region overlying the inferior lamina was localized under fluoroscopic visualization and the soft tissues overlying this structure were infiltrated with 4 ml. of 1% Lidocaine without Epinephrine. The Tuohy needle was inserted into the epidural space using a paramedian approach.   The epidural space was localized using loss of resistance along with counter oblique bi-planar fluoroscopic views.  After negative aspirate for air, blood, and CSF, a 2 ml. volume of Isovue-250 was injected into the epidural space and the flow of contrast was observed. Radiographs were obtained for documentation purposes.    The injectate was administered into the level noted above.   Additional Comments:  The patient tolerated the procedure well Dressing: 2 x 2 sterile gauze and Band-Aid    Post-procedure details: Patient was observed during the procedure. Post-procedure instructions were reviewed.  Patient  left the clinic in stable condition.

## 2021-04-27 NOTE — Progress Notes (Signed)
Low back pain. Has gotten better recently. No leg pain. Numeric Pain Rating Scale and Functional Assessment Average Pain 6   In the last MONTH (on 0-10 scale) has pain interfered with the following?  1. General activity like being  able to carry out your everyday physical activities such as walking, climbing stairs, carrying groceries, or moving a chair?  Rating(10)   +Driver, -BT, -Dye Allergies.

## 2021-04-27 NOTE — Telephone Encounter (Signed)
Patient is here for an ESI and is requesting a refill of Lyrica.

## 2021-05-01 ENCOUNTER — Telehealth: Payer: Self-pay | Admitting: Specialist

## 2021-05-01 NOTE — Telephone Encounter (Signed)
Err

## 2021-05-01 NOTE — Telephone Encounter (Signed)
Pt called requesting a refill of Lyrica. Please send to [facility]. Please call pt when sent. Pt phone number is [phone].

## 2021-05-04 ENCOUNTER — Ambulatory Visit: Payer: 59 | Admitting: Adult Health

## 2021-05-04 ENCOUNTER — Other Ambulatory Visit: Payer: Self-pay | Admitting: Specialist

## 2021-05-21 ENCOUNTER — Ambulatory Visit: Payer: 59 | Admitting: Specialist

## 2021-06-01 ENCOUNTER — Encounter: Payer: Self-pay | Admitting: Specialist

## 2021-06-01 ENCOUNTER — Ambulatory Visit (INDEPENDENT_AMBULATORY_CARE_PROVIDER_SITE_OTHER): Payer: 59 | Admitting: Specialist

## 2021-06-01 ENCOUNTER — Other Ambulatory Visit: Payer: Self-pay

## 2021-06-01 VITALS — BP 131/82 | HR 80 | Ht 74.0 in | Wt 220.0 lb

## 2021-06-01 DIAGNOSIS — M5126 Other intervertebral disc displacement, lumbar region: Secondary | ICD-10-CM

## 2021-06-01 DIAGNOSIS — M545 Low back pain, unspecified: Secondary | ICD-10-CM

## 2021-06-01 DIAGNOSIS — M79606 Pain in leg, unspecified: Secondary | ICD-10-CM

## 2021-06-01 MED ORDER — PREGABALIN 150 MG PO CAPS: 150.0000 mg | ORAL_CAPSULE | Freq: Two times a day (BID) | ORAL | 0 refills | Status: DC

## 2021-06-01 NOTE — Patient Instructions (Signed)
Plan: Avoid bending, stooping and avoid lifting weights greater than 10 lbs. Avoid prolong standing and walking. Order for a new walker with wheels. Surgery scheduling secretary Tivis Ringer, will call you in the next week to schedule for surgery.  Surgery recommended is a right L5-S1 microdiscectomy with removal of the disc herniation and decompression of the right nerves. Risk of surgery includes risk of infection 1 in 300 patients, bleeding 07/998 chance you would need a transfusion.   Risk to the nerves is one in 10,000.  Expect improved walking and standing and bending tolerance. Expect relief of leg pain but numbness may persist depending on the length and degree of pressure that has been present.

## 2021-06-01 NOTE — Progress Notes (Signed)
Office Visit Note   Patient: Jose Mitchell           Date of Birth: [birth year]           MRN: [id number] Visit Date: 06/01/2021              Requested by: No referring provider defined for this encounter. PCP: Patient, No Pcp Per (Inactive)   Assessment & Plan: Visit Diagnoses:  1. HNP (herniated nucleus pulposus), lumbar   2. Low back pain radiating down leg     Plan: Avoid bending, stooping and avoid lifting weights greater than 10 lbs. Avoid prolong standing and walking. Order for a new walker with wheels. Surgery scheduling secretary Tivis Ringer, will call you in the next week to schedule for surgery.  Surgery recommended is a right L5-S1 microdiscectomy with removal of the disc herniation and decompression of the right nerves. Risk of surgery includes risk of infection 1 in 300 patients, bleeding 07/998 chance you would need a transfusion.   Risk to the nerves is one in 10,000.  Expect improved walking and standing and bending tolerance. Expect relief of leg pain but numbness may persist depending on the length and degree of pressure that has been present.    Follow-Up Instructions: No follow-ups on file.   Orders:  No orders of the defined types were placed in this encounter.  No orders of the defined types were placed in this encounter.     Procedures: No procedures performed   Clinical Data: Findings:  CLINICAL DATA: Lumbar radiculopathy. Low back pain.  EXAM: MRI LUMBAR SPINE WITHOUT CONTRAST  TECHNIQUE: Multiplanar, multisequence MR imaging of the lumbar spine was performed. No intravenous contrast was administered.  COMPARISON: Lumbar radiographs September 29, 2020.  FINDINGS: Segmentation: Standard.  Alignment: No substantial sagittal subluxation. Broad dextrocurvature.  Vertebrae: Vertebral body heights are maintained. No specific evidence of acute fracture, discitis/osteomyelitis, or suspicious bone lesion.  Conus medullaris and cauda  equina: Conus extends to the L1-L2 level. Conus appears normal.  Paraspinal and other soft tissues: Unremarkable  Disc levels:  T12-L1: No significant disc protrusion, foraminal stenosis, or canal stenosis.  L1-L2: No significant disc protrusion, foraminal stenosis, or canal stenosis.  L2-L3: No significant disc protrusion, foraminal stenosis, or canal stenosis.  L3-L4: Small disc bulge and mild bilateral facet hypertrophy with ligamentum flavum thickening. Prominent dorsal epidural fat. Mild bilateral subarticular recess stenosis without significant central canal or foraminal stenosis.  L4-L5: Broad disc bulge with mild bilateral facet hypertrophy and ligamentum flavum thickening. Prominent dorsal epidural fat. Resulting mild canal and bilateral subarticular recess stenosis. Mild left foraminal stenosis. No significant right foraminal stenosis.  L5-S1: Large right subarticular disc protrusion with resulting severe right subarticular recess stenosis and suspected impingement of the descending right S1 nerve roots (series 5 and 6, image 40). No significant central canal stenosis or foraminal stenosis.  IMPRESSION: 1. At L5-S1 a large right subarticular disc protrusion results in severe right subarticular recess and suspected impingement of the descending right S1 nerve roots. 2. Prominent dorsal epidural fat and degenerative change results in mild canal stenosis at L4-L5. Also, mild left foraminal stenosis at this level.   Electronically Signed     Subjective: Chief Complaint  Patient presents with   Lower Back - Follow-up    He had a Right L5-S1 IL injection on 04/27/21 with Dr. 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 He has chronic back and right buttock pain. MRI with mild lateral recess stenosis and  At  the L5-S1 there is a large disc herniation with right lateral recess stenosis and nerve comprssion.   Review of Systems  Constitutional: Negative.  Negative for activity change, appetite change, chills, diaphoresis, fatigue, fever and unexpected weight change.  HENT: Negative.    Eyes: Negative.   Respiratory: Negative.    Cardiovascular: Negative.   Gastrointestinal: Negative.   Endocrine: Negative.   Genitourinary: Negative.   Musculoskeletal: Negative.   Skin: Negative.   Allergic/Immunologic: Negative.   Neurological: Negative.   Hematological: Negative.   Psychiatric/Behavioral: Negative.      Objective: Vital Signs: BP 131/82 (BP Location: Left Arm, Patient Position: Sitting)   Pulse 80   Ht 6\' 2"  (1.88 m)   Wt 220 lb (99.8 kg)   BMI 28.25 kg/m   Physical Exam Constitutional:      Appearance: He is well-developed.  HENT:     Head: Normocephalic and atraumatic.  Eyes:     Pupils: Pupils are equal, round, and reactive to light.  Pulmonary:     Effort: Pulmonary effort is normal.     Breath sounds: Normal breath sounds.  Abdominal:     General: Bowel sounds are normal.     Palpations: Abdomen is soft.  Musculoskeletal:     Cervical back: Normal range of motion and neck supple.     Lumbar back: Negative right straight leg raise test and negative left straight leg raise test.  Skin:    General: Skin is warm and dry.  Neurological:     Mental Status: He is alert and oriented to person, place, and time.  Psychiatric:        Behavior: Behavior normal.        Thought Content: Thought content normal.        Judgment: Judgment normal.    Back Exam   Tenderness  The patient is experiencing tenderness in the lumbar.  Range of Motion  Extension:  abnormal  Flexion:  abnormal  Lateral bend right:  abnormal  Lateral bend left:  abnormal  Rotation right:  abnormal   Muscle Strength  Right Quadriceps:  5/5  Left Quadriceps:  5/5  Right Hamstrings:  5/5    Tests  Straight leg raise right: negative Straight leg raise left: negative  Reflexes  Patellar:  0/4  Other  Heel walk: normal Sensation: normal Erythema: no back redness     Specialty Comments:  No specialty comments available.  Imaging: No results found.   PMFS History: Patient Active Problem List   Diagnosis Date Noted   Restless leg 04/08/2020   Bipolar 1 disorder, depressed (HCC) 04/08/2020   Bipolar 2 disorder, major depressive episode (HCC) 04/08/2020   Low back pain radiating down leg 02/29/2020   Past Medical History:  Diagnosis Date   Alcohol abuse    Bipolar 1 disorder (HCC)    Cocaine abuse (HCC)    Schizophrenia (HCC)     Family History  Family history unknown: Yes    No past surgical history on file. Social History   Occupational History   Not on file  Tobacco Use   Smoking status: Every Day    Packs/day: 1.00    Types: Cigarettes   Smokeless tobacco: Current    Types: Chew  Vaping Use   Vaping Use: Never  used  Substance and Sexual Activity   Alcohol use: Not Currently    Comment: daily   Drug use: Yes    Types: Cocaine   Sexual activity: Not on file

## 2021-06-02 ENCOUNTER — Ambulatory Visit: Payer: 59 | Admitting: Adult Health

## 2021-07-16 ENCOUNTER — Ambulatory Visit: Payer: 59 | Admitting: Nurse Practitioner

## 2021-07-21 ENCOUNTER — Other Ambulatory Visit: Payer: Self-pay | Admitting: Specialist

## 2021-07-21 ENCOUNTER — Ambulatory Visit: Payer: 59 | Admitting: Family Medicine

## 2021-07-21 ENCOUNTER — Telehealth: Payer: Self-pay

## 2021-07-21 MED ORDER — PREGABALIN 100 MG PO CAPS: 100.0000 mg | ORAL_CAPSULE | Freq: Two times a day (BID) | ORAL | 0 refills | Status: DC

## 2021-07-21 NOTE — Telephone Encounter (Signed)
Pt called triage and stated he hasnt heard about scheduling surgery yet. He also wanted to know why his Lyrica was changed from 150mg  to 100mg . Please advise pt

## 2021-07-21 NOTE — Telephone Encounter (Signed)
Surgery sheet started, printed note for Dr. Otelia Sergeant to review

## 2021-07-22 NOTE — Telephone Encounter (Signed)
Patient states that he is not taking anything except for generic Prilosec.

## 2021-07-22 NOTE — Telephone Encounter (Signed)
I called patient and scheduled surgery. 

## 2021-07-23 ENCOUNTER — Ambulatory Visit (INDEPENDENT_AMBULATORY_CARE_PROVIDER_SITE_OTHER): Payer: 59 | Admitting: Surgery

## 2021-07-23 ENCOUNTER — Other Ambulatory Visit: Payer: Self-pay

## 2021-07-23 ENCOUNTER — Encounter: Payer: Self-pay | Admitting: Surgery

## 2021-07-23 VITALS — BP 127/83 | HR 66 | Ht 74.0 in | Wt 220.0 lb

## 2021-07-23 DIAGNOSIS — M5126 Other intervertebral disc displacement, lumbar region: Secondary | ICD-10-CM

## 2021-07-23 NOTE — Progress Notes (Signed)
40 year old white male with history of right L5-S1 HNP comes in for preop evaluation.  States that low back pain and right lower extremity symptoms are unchanged from previous visit.  He is wanting to proceed with right L5-S1 microdiscectomy as scheduled.  Today history and physical performed.  Review of systems positive for acid reflux and occasional epigastric discomfort.  Patient states that he does not have a primary care physician.  Surgery procedure discussed along with potential rehab/recovery time.  Recommend that he be out of work for at least 6 weeks postop.  All questions answered.

## 2021-07-24 ENCOUNTER — Telehealth: Payer: Self-pay | Admitting: Specialist

## 2021-07-29 ENCOUNTER — Telehealth: Payer: Self-pay

## 2021-07-29 NOTE — Telephone Encounter (Signed)
Patient cancelled microdiscectomy scheduled for 08-04-21.  He stated he cannot afford to have surgery right now.

## 2021-08-04 ENCOUNTER — Ambulatory Visit (HOSPITAL_COMMUNITY): Admission: RE | Admit: 2021-08-04 | Payer: 59 | Source: Home / Self Care | Admitting: Specialist

## 2021-08-04 ENCOUNTER — Encounter (HOSPITAL_COMMUNITY): Admission: RE | Payer: Self-pay | Source: Home / Self Care

## 2021-08-04 SURGERY — LUMBAR LAMINECTOMY/DECOMPRESSION MICRODISCECTOMY
Anesthesia: General

## 2021-08-31 ENCOUNTER — Other Ambulatory Visit: Payer: Self-pay

## 2021-08-31 ENCOUNTER — Ambulatory Visit (INDEPENDENT_AMBULATORY_CARE_PROVIDER_SITE_OTHER): Payer: 59 | Admitting: Specialist

## 2021-08-31 ENCOUNTER — Encounter: Payer: Self-pay | Admitting: Specialist

## 2021-08-31 VITALS — BP 117/79 | HR 79 | Ht 74.0 in | Wt 220.0 lb

## 2021-08-31 DIAGNOSIS — M5126 Other intervertebral disc displacement, lumbar region: Secondary | ICD-10-CM | POA: Diagnosis not present

## 2021-08-31 DIAGNOSIS — M19011 Primary osteoarthritis, right shoulder: Secondary | ICD-10-CM

## 2021-08-31 MED ORDER — NAPROXEN 500 MG PO TABS: 500.0000 mg | ORAL_TABLET | Freq: Two times a day (BID) | ORAL | 1 refills | Status: DC

## 2021-08-31 MED ORDER — PREGABALIN 75 MG PO CAPS: 75.0000 mg | ORAL_CAPSULE | Freq: Two times a day (BID) | ORAL | 0 refills | Status: DC

## 2021-08-31 NOTE — Addendum Note (Signed)
Addended by: Vira Browns on: 08/31/2021 02:49 PM   Modules accepted: Orders

## 2021-08-31 NOTE — Patient Instructions (Signed)
Plan: Avoid overhead lifting and overhead use of the arms. Pillows to keep from sleeping directly on the shoulders Limited lifting to less than 10 lbs. Ice or heat for relief. NSAIDs are helpful, such as alleve or motrin, be careful not to use in excess as they place burdens on the kidney. Stretching exercise help and strengthening is helpful to build endurance. Shoulder Range of Motion Exercises Shoulder range of motion (ROM) exercises are done to keep the shoulder moving freely or to increase movement. They are often recommended for people who have shoulder pain or stiffness or who are recovering from a shoulder surgery. Phase 1 exercises When you are able, do this exercise 1-2 times per day for 30-60 seconds in each direction, or as directed by your health care provider. Pendulum exercise To do this exercise while sitting: Sit in a chair or at the edge of your bed with your feet flat on the floor. Let your affected arm hang down in front of you over the edge of the bed or chair. Relax your shoulder, arm, and hand. Rock your body so your arm gently swings in small circles. You can also use your unaffected arm to start the motion. Repeat changing the direction of the circles, swinging your arm left and right, and swinging your arm forward and back. To do this exercise while standing: Stand next to a sturdy chair or table, and hold on to it with your hand on your unaffected side. Bend forward at the waist. Bend your knees slightly. Relax your shoulder, arm, and hand. While keeping your shoulder relaxed, use body motion to swing your arm in small circles. Repeat changing the direction of the circles, swinging your arm left and right, and swinging your arm forward and back. Between exercises, stand up tall and take a short break to relax your lower back.  Phase 2 exercises Do these exercises 1-2 times per day or as told by your health care provider. Hold each stretch for 30 seconds, and  repeat 3 times. Do the exercises with one or both arms as instructed by your health care provider. For these exercises, sit at a table with your hand and arm supported by the table. A chair that slides easily or has wheels can be helpful. External rotation Turn your chair so that your affected side is nearest to the table. Place your forearm on the table to your side. Bend your elbow about 90 at the elbow (right angle) and place your hand palm facing down on the table. Your elbow should be about 6 inches away from your side. Keeping your arm on the table, lean your body forward. Abduction Turn your chair so that your affected side is nearest to the table. Place your forearm and hand on the table so that your thumb points toward the ceiling and your arm is straight out to your side. Slide your hand out to the side and away from you, using your unaffected arm to do the work. To increase the stretch, you can slide your chair away from the table. Flexion: forward stretch Sit facing the table. Place your hand and elbow on the table in front of you. Slide your hand forward and away from you, using your unaffected arm to do the work. To increase the stretch, you can slide your chair backward. Phase 3 exercises Do these exercises 1-2 times per day or as told by your health care provider. Hold each stretch for 30 seconds, and repeat 3 times. Do the  exercises with one or both arms as instructed by your health care provider. Cross-body stretch: posterior capsule stretch Lift your arm straight out in front of you. Bend your arm 90 at the elbow (right angle) so your forearm moves across your body. Use your other arm to gently pull the elbow across your body, toward your other shoulder. Wall climbs Stand with your affected arm extended out to the side with your hand resting on a door frame. Slide your hand slowly up the door frame. To increase the stretch, step through the door frame. Keep your body  upright and do not lean. Wand exercises You will need a cane, a piece of PVC pipe, or a sturdy wooden dowel for wand exercises. Flexion To do this exercise while standing: Hold the wand with both of your hands, palms down. Using the other arm to help, lift your arms up and over your head, if able. Push upward with your other arm to gently increase the stretch. To do this exercise while lying down: Lie on your back with your elbows resting on the floor and the wand in both your hands. Your hands will be palm down, or pointing toward your feet. Lift your hands toward the ceiling, using your unaffected arm to help if needed. Bring your arms overhead as able, using your unaffected arm to help if needed. Internal rotation Stand while holding the wand behind you with both hands. Your unaffected arm should be extended above your head with the arm of the affected side extended behind you at the level of your waist. The wand should be pointing straight up and down as you hold it. Slowly pull the wand up behind your back by straightening the elbow of your unaffected arm and bending the elbow of your affected arm. External rotation Lie on your back with your affected upper arm supported on a small pillow or rolled towel. When you first do this exercise, keep your upper arm close to your body. Over time, bring your arm up to a 90 angle out to the side. Hold the wand across your stomach and with both hands palm up. Your elbow on your affected side should be bent at a 90 angle. Use your unaffected side to help push your forearm away from you and toward the floor. Keep your elbow on your affected side bent at a 90 angle. Contact a health care provider if you have: New or increasing pain. New numbness, tingling, weakness, or discoloration in your arm or hand. This information is not intended to replace advice given to you by your health care provider. Make sure you discuss any questions you have with your  health care provider. Document Revised: 08/24/2017 Document Reviewed: 08/24/2017 Elsevier Patient Education  2022 [employer].

## 2021-08-31 NOTE — Progress Notes (Signed)
Office Visit Note   Patient: Jose Mitchell           Date of Birth: Sep 05, 1980           MRN: [id number] Visit Date: 08/31/2021              Requested by: No referring provider defined for this encounter. PCP: Patient, No Pcp Per (Inactive)   Assessment & Plan: Visit Diagnoses:  1. HNP (herniated nucleus pulposus), lumbar     Plan: Avoid overhead lifting and overhead use of the arms. Pillows to keep from sleeping directly on the shoulders Limited lifting to less than 10 lbs. Ice or heat for relief. NSAIDs are helpful, such as alleve or motrin, be careful not to use in excess as they place burdens on the kidney. Stretching exercise help and strengthening is helpful to build endurance.   Follow-Up Instructions: No follow-ups on file.   Orders:  No orders of the defined types were placed in this encounter.  No orders of the defined types were placed in this encounter.     Procedures: No procedures performed   Clinical Data: No additional findings.   Subjective: Chief Complaint  Patient presents with   Lower Back - Follow-up    41 year old male with history of lumbar disc herniation and is unable to afford the copay that is needed to allow him to operated on. Today he relates the back and right leg are painful. He is trying to stay off the right side and feels he may be over using the left side. He is having right shoulder pain pain with lying on the right shoulder and with using the right arm overhead. No pain with cough or sneeze.    Review of Systems  Constitutional:  Positive for fever.  HENT:  Positive for congestion, rhinorrhea, sinus pain, sneezing and trouble swallowing.   Eyes: Negative.   Respiratory: Negative.    Cardiovascular: Negative.   Gastrointestinal: Negative.   Endocrine: Negative.   Genitourinary: Negative.   Musculoskeletal: Negative.   Skin: Negative.   Allergic/Immunologic: Negative.   Neurological: Negative.   Hematological:  Negative.   Psychiatric/Behavioral: Negative.      Objective: Vital Signs: There were no vitals taken for this visit.  Physical Exam Constitutional:      Appearance: He is well-developed.  HENT:     Head: Normocephalic and atraumatic.  Eyes:     Pupils: Pupils are equal, round, and reactive to light.  Pulmonary:     Effort: Pulmonary effort is normal.     Breath sounds: Normal breath sounds.  Abdominal:     General: Bowel sounds are normal.     Palpations: Abdomen is soft.  Musculoskeletal:        General: Normal range of motion.     Cervical back: Normal range of motion and neck supple.  Skin:    General: Skin is warm and dry.  Neurological:     Mental Status: He is alert and oriented to person, place, and time.  Psychiatric:        Behavior: Behavior normal.        Thought Content: Thought content normal.        Judgment: Judgment normal.   Right Shoulder Exam   Tenderness  The patient is experiencing tenderness in the acromioclavicular joint.  Range of Motion  Active abduction:  normal  Passive abduction:  normal  Extension:  normal  External rotation:  normal  Forward flexion:  normal  Muscle Strength  Abduction: 5/5  Internal rotation: 5/5  External rotation: 5/5  Supraspinatus: 5/5  Subscapularis: 5/5  Biceps: 5/5   Tests  Apprehension: negative Hawkins test: negative Cross arm: negative Impingement: negative Drop arm: negative Sulcus: absent  Other  Erythema: absent Scars: absent  Comments:  Tender right AC joint pain with arm across chest loading the joint.    Specialty Comments:  No specialty comments available.  Imaging: No results found.   PMFS History: Patient Active Problem List   Diagnosis Date Noted   Restless leg 04/08/2020   Bipolar 1 disorder, depressed (HCC) 04/08/2020   Bipolar 2 disorder, major depressive episode (HCC) 04/08/2020   Low back pain radiating down leg 02/29/2020   Past Medical History:  Diagnosis  Date   Alcohol abuse    Bipolar 1 disorder (HCC)    Cocaine abuse (HCC)    Schizophrenia (HCC)     Family History  Family history unknown: Yes    History reviewed. No pertinent surgical history. Social History   Occupational History   Not on file  Tobacco Use   Smoking status: Every Day    Packs/day: 1.00    Types: Cigarettes   Smokeless tobacco: Current    Types: Chew  Vaping Use   Vaping Use: Never used  Substance and Sexual Activity   Alcohol use: Not Currently    Comment: daily   Drug use: Yes    Types: Cocaine   Sexual activity: Not on file

## 2021-09-09 ENCOUNTER — Telehealth: Payer: Self-pay | Admitting: Specialist

## 2021-09-09 NOTE — Telephone Encounter (Signed)
Patient called. Says the [facility] Otelia Sergeant sent him to, does not take his insurance. Would like to be sent somewhere else. His call back number is [phone]

## 2021-09-09 NOTE — Telephone Encounter (Signed)
Do you know if any pain management facilities that would accept this patients insurance?

## 2021-09-10 ENCOUNTER — Ambulatory Visit (INDEPENDENT_AMBULATORY_CARE_PROVIDER_SITE_OTHER): Payer: 59 | Admitting: Nurse Practitioner

## 2021-09-10 ENCOUNTER — Encounter: Payer: Self-pay | Admitting: Nurse Practitioner

## 2021-09-10 ENCOUNTER — Other Ambulatory Visit: Payer: Self-pay

## 2021-09-10 VITALS — BP 134/89 | HR 63 | Temp 98.7°F | Ht 74.02 in | Wt 227.4 lb

## 2021-09-10 DIAGNOSIS — Z7689 Persons encountering health services in other specified circumstances: Secondary | ICD-10-CM

## 2021-09-10 DIAGNOSIS — M5126 Other intervertebral disc displacement, lumbar region: Secondary | ICD-10-CM

## 2021-09-10 DIAGNOSIS — K219 Gastro-esophageal reflux disease without esophagitis: Secondary | ICD-10-CM

## 2021-09-10 MED ORDER — OMEPRAZOLE 40 MG PO CPDR: 40.0000 mg | DELAYED_RELEASE_CAPSULE | Freq: Every day | ORAL | 1 refills | Status: DC

## 2021-09-10 MED ORDER — PREGABALIN 150 MG PO CAPS: 150.0000 mg | ORAL_CAPSULE | Freq: Two times a day (BID) | ORAL | 0 refills | Status: DC

## 2021-09-10 NOTE — Progress Notes (Signed)
BP 134/89    Pulse 63    Temp 98.7 F (37.1 C) (Oral)    Ht 6' 2.02" (1.88 m)    Wt 227 lb 6.4 oz (103.1 kg)    SpO2 97%    BMI 29.18 kg/m    Subjective:    Patient ID: Jose Mitchell, male    DOB: 15-Oct-1980, 41 y.o.   MRN: [id number]  HPI: Jose Mitchell is a 41 y.o. male  Chief Complaint  Patient presents with   New Patient (Initial Visit)    Back and shoulder pain, indigestion real bad has been taking his mothers mediation.    Patient presents to clinic to establish care with new PCP.  Introduced to Publishing rights manager role and practice setting.  All questions answered.  Discussed provider/patient relationship and expectations.  Patient reports a history of Bipolar, depression and anxiety.  He was previously taking Lamictal which he felt like was making him worse.  Was previously seeing psychiatry but stopped going.    Patient denies a history of: Hypertension, Elevated Cholesterol, Diabetes, Thyroid problems, Depression, Anxiety, Neurological problems, and Abdominal problems.   GERD GERD control status: uncontrolled Satisfied with current treatment?  Takes his Mom's omeprazole which helps him Heartburn frequency: daily Medication side effects: no  Medication compliance: stable Previous GERD medications: never been prescribed anything Antacid use frequency:  all the time Duration: years Nature:  Location:  Heartburn duration:  Alleviatiating factors:  omeprazole and tums Aggravating factors: pizza Drinking a lot of water makes the GERD bad also  BACK PAIN Duration: months Mechanism of injury: unknown Location: bilateral Onset: gradual Severity: severe Quality: throbbing Frequency: constant Radiation: R leg below the knee and R leg above the knee Aggravating factors: movement, bending over Alleviating factors: rest Status: worse Treatments attempted:  steroid injections, lyrica   Relief with NSAIDs?: no Nighttime pain:  yes Paresthesias / decreased sensation:   yes Bowel / bladder incontinence:  no Fevers:  no Dysuria / urinary frequency:  no Has been taking Lyrica for his back pain.  Patient states he was taking 150mg  worked better for him.  The Ortho cut the medication back to 75mg .  He is taking both in the morning to make it 150mg  and then doesn't take any during the day.   Active Ambulatory Problems    Diagnosis Date Noted   Low back pain radiating down leg 02/29/2020   Restless leg 04/08/2020   Bipolar 1 disorder, depressed (HCC) 04/08/2020   Bipolar 2 disorder, major depressive episode (HCC) 04/08/2020   Resolved Ambulatory Problems    Diagnosis Date Noted   No Resolved Ambulatory Problems   Past Medical History:  Diagnosis Date   Alcohol abuse    Bipolar 1 disorder (HCC)    Cocaine abuse (HCC)    Schizophrenia (HCC)    History reviewed. No pertinent surgical history. Family History  Family history unknown: Yes     Review of Systems  Gastrointestinal:  Positive for abdominal pain.  Musculoskeletal:  Positive for back pain.   Per HPI unless specifically indicated above     Objective:    BP 134/89    Pulse 63    Temp 98.7 F (37.1 C) (Oral)    Ht 6' 2.02" (1.88 m)    Wt 227 lb 6.4 oz (103.1 kg)    SpO2 97%    BMI 29.18 kg/m   Wt Readings from Last 3 Encounters:  09/10/21 227 lb 6.4 oz (103.1 kg)  08/31/21 220 lb (99.8  kg)  07/23/21 220 lb (99.8 kg)    Physical Exam Vitals and nursing note reviewed.  Constitutional:      General: He is not in acute distress.    Appearance: Normal appearance. He is not ill-appearing, toxic-appearing or diaphoretic.  HENT:     Head: Normocephalic.     Right Ear: External ear normal.     Left Ear: External ear normal.     Nose: Nose normal. No congestion or rhinorrhea.     Mouth/Throat:     Mouth: Mucous membranes are moist.  Eyes:     General:        Right eye: No discharge.        Left eye: No discharge.     Extraocular Movements: Extraocular movements intact.      Conjunctiva/sclera: Conjunctivae normal.     Pupils: Pupils are equal, round, and reactive to light.  Cardiovascular:     Rate and Rhythm: Normal rate and regular rhythm.     Heart sounds: No murmur heard. Pulmonary:     Effort: Pulmonary effort is normal. No respiratory distress.     Breath sounds: Normal breath sounds. No wheezing, rhonchi or rales.  Abdominal:     General: Abdomen is flat. Bowel sounds are normal.  Musculoskeletal:     Cervical back: Normal range of motion and neck supple.  Skin:    General: Skin is warm and dry.     Capillary Refill: Capillary refill takes less than 2 seconds.  Neurological:     General: No focal deficit present.     Mental Status: He is alert and oriented to person, place, and time.  Psychiatric:        Mood and Affect: Mood normal.        Behavior: Behavior normal.        Thought Content: Thought content normal.        Judgment: Judgment normal.    Results for orders placed or performed during the hospital encounter of 02/22/18  Comprehensive metabolic panel  Result Value Ref Range   Sodium 142 135 - 145 mmol/L   Potassium 3.5 3.5 - 5.1 mmol/L   Chloride 103 98 - 111 mmol/L   CO2 30 22 - 32 mmol/L   Glucose, Bld 55 (L) 70 - 99 mg/dL   BUN 8 6 - 20 mg/dL   Creatinine, Ser 7.79 0.61 - 1.24 mg/dL   Calcium 9.6 8.9 - 39.0 mg/dL   Total Protein 7.6 6.5 - 8.1 g/dL   Albumin 4.5 3.5 - 5.0 g/dL   AST 16 15 - 41 U/L   ALT 11 0 - 44 U/L   Alkaline Phosphatase 47 38 - 126 U/L   Total Bilirubin 0.4 0.3 - 1.2 mg/dL   GFR calc non Af Amer >60 >60 mL/min   GFR calc Af Amer >60 >60 mL/min   Anion gap 9 5 - 15  Ethanol  Result Value Ref Range   Alcohol, Ethyl (B) <10 <10 mg/dL  cbc  Result Value Ref Range   WBC 10.0 4.0 - 10.5 K/uL   RBC 5.49 4.22 - 5.81 MIL/uL   Hemoglobin 16.8 13.0 - 17.0 g/dL   HCT 30.0 92.3 - 30.0 %   MCV 89.1 78.0 - 100.0 fL   MCH 30.6 26.0 - 34.0 pg   MCHC 34.4 30.0 - 36.0 g/dL   RDW 76.2 26.3 - 33.5 %   Platelets  315 150 - 400 K/uL  Rapid urine drug screen (hospital  performed)  Result Value Ref Range   Opiates NONE DETECTED NONE DETECTED   Cocaine POSITIVE (A) NONE DETECTED   Benzodiazepines NONE DETECTED NONE DETECTED   Amphetamines NONE DETECTED NONE DETECTED   Tetrahydrocannabinol NONE DETECTED NONE DETECTED   Barbiturates NONE DETECTED NONE DETECTED  CBG monitoring, ED  Result Value Ref Range   Glucose-Capillary 83 70 - 99 mg/dL      Assessment & Plan:   Problem List Items Addressed This Visit   None Visit Diagnoses     Gastroesophageal reflux disease, unspecified whether esophagitis present    -  Primary   Will start Omeprazole 40mg  daily. Omeprazole 20mg  was not enough to control symptoms he still needed tums. Will also check H pylori. FU in 1 month.   Relevant Medications   omeprazole (PRILOSEC) 40 MG capsule   Other Relevant Orders   H. pylori antigen, stool   Lumbar herniated disc       Ongoing. Will increase Lyrica to 150mg  BID. Will try to get patient in with pain management for further evaluation and intervention of back pain.   Relevant Orders   Ambulatory referral to Pain Clinic   Encounter to establish care            Follow up plan: Return in about 1 month (around 10/08/2021) for Physical and Fasting labs.

## 2021-09-11 NOTE — Telephone Encounter (Signed)
Resubmitted referral to CPR Phys med with [facility]

## 2021-09-16 LAB — H. PYLORI ANTIGEN, STOOL: H pylori Ag, Stl: NEGATIVE

## 2021-09-16 NOTE — Progress Notes (Signed)
Please let patient know his H Pylori stool test was negative.  No further treatment is needed.  Follow up as discussed.

## 2021-10-12 ENCOUNTER — Other Ambulatory Visit: Payer: Self-pay

## 2021-10-12 ENCOUNTER — Encounter: Payer: Self-pay | Admitting: Nurse Practitioner

## 2021-10-12 ENCOUNTER — Ambulatory Visit (INDEPENDENT_AMBULATORY_CARE_PROVIDER_SITE_OTHER): Payer: 59 | Admitting: Nurse Practitioner

## 2021-10-12 VITALS — BP 122/73 | HR 73 | Temp 97.7°F | Ht 74.0 in | Wt 232.8 lb

## 2021-10-12 DIAGNOSIS — Z136 Encounter for screening for cardiovascular disorders: Secondary | ICD-10-CM

## 2021-10-12 DIAGNOSIS — F319 Bipolar disorder, unspecified: Secondary | ICD-10-CM | POA: Diagnosis not present

## 2021-10-12 DIAGNOSIS — Z1159 Encounter for screening for other viral diseases: Secondary | ICD-10-CM

## 2021-10-12 DIAGNOSIS — M79606 Pain in leg, unspecified: Secondary | ICD-10-CM

## 2021-10-12 DIAGNOSIS — R7301 Impaired fasting glucose: Secondary | ICD-10-CM

## 2021-10-12 DIAGNOSIS — M545 Low back pain, unspecified: Secondary | ICD-10-CM

## 2021-10-12 DIAGNOSIS — Z114 Encounter for screening for human immunodeficiency virus [HIV]: Secondary | ICD-10-CM

## 2021-10-12 DIAGNOSIS — Z0000 Encounter for general adult medical examination without abnormal findings: Secondary | ICD-10-CM

## 2021-10-12 LAB — URINALYSIS, ROUTINE W REFLEX MICROSCOPIC
Bilirubin, UA: NEGATIVE
Glucose, UA: NEGATIVE
Ketones, UA: NEGATIVE
Leukocytes,UA: NEGATIVE
Nitrite, UA: NEGATIVE
Protein,UA: NEGATIVE
RBC, UA: NEGATIVE
Specific Gravity, UA: <1.005 — ABNORMAL LOW (ref 1.005–1.030)
Urobilinogen, Ur: 0.2 mg/dL (ref 0.2–1.0)
pH, UA: 6 (ref 5.0–7.5)

## 2021-10-12 MED ORDER — PREGABALIN 150 MG PO CAPS: 150.0000 mg | ORAL_CAPSULE | Freq: Two times a day (BID) | ORAL | 0 refills | Status: DC

## 2021-10-12 NOTE — Assessment & Plan Note (Signed)
Chronic. Previous imaging reviewed.  Has S1 nerve root impingement and spinal Stenosis at L4-5.  Lyrica makes pain more tolerable.  Does not take the pain away completely.  Will continue with current dose of Lyrica.  Refill sent today.  Follow up in 3 months for reevaluation. ?

## 2021-10-12 NOTE — Assessment & Plan Note (Signed)
Chronic. Does not follow up with Behavioral Health.  Did not feel like the medication was working or him.  Recommend following up with them for other treatment options. ?

## 2021-10-12 NOTE — Progress Notes (Signed)
BP 122/73   Pulse 73   Temp 97.7 F (36.5 C) (Oral)   Ht 6\' 2"  (1.88 m)   Wt 232 lb 12.8 oz (105.6 kg)   SpO2 98%   BMI 29.89 kg/m    Subjective:    Patient ID: Jose Mitchell, male    DOB: [birth year], 41 y.o.   MRN: [id number]  HPI: Jose Mitchell is a 41 y.o. male presenting on 10/12/2021 for comprehensive medical examination. Current medical complaints include: still having shoulder pain  He currently lives with: Interim Problems from his last visit: no   Denies HA, CP, SOB, dizziness, palpitations, visual changes, and lower extremity swelling.    Depression Screen done today and results listed below:  Depression screen [facility] 2/9 10/12/2021 09/10/2021  Decreased Interest 0 0  Down, Depressed, Hopeless 0 0  PHQ - 2 Score 0 0  Altered sleeping 0 0  Tired, decreased energy 0 0  Change in appetite 0 0  Feeling bad or failure about yourself  0 0  Trouble concentrating 0 0  Moving slowly or fidgety/restless 0 0  Suicidal thoughts 0 0  PHQ-9 Score 0 0  Difficult doing work/chores Not difficult at all Not difficult at all    The patient does not have a history of falls. I did complete a risk assessment for falls. A plan of care for falls was documented.   Past Medical History:  Past Medical History:  Diagnosis Date   Alcohol abuse    Bipolar 1 disorder (HCC)    Cocaine abuse (HCC)    Schizophrenia (HCC)     Surgical History:  History reviewed. No pertinent surgical history.  Medications:  Current Outpatient Medications on File Prior to Visit  Medication Sig   naproxen (NAPROSYN) 500 MG tablet Take 1 tablet (500 mg total) by mouth 2 (two) times daily with a meal.   omeprazole (PRILOSEC) 40 MG capsule Take 1 capsule (40 mg total) by mouth daily.   No current facility-administered medications on file prior to visit.    Allergies:  Allergies  Allergen Reactions   Septra [Sulfamethoxazole-Trimethoprim]     Childhood allergy. Does not know reaction    Social  History:  Social History   Socioeconomic History   Marital status: Single    Spouse name: Not on file   Number of children: Not on file   Years of education: Not on file   Highest education level: Not on file  Occupational History   Not on file  Tobacco Use   Smoking status: Former    Packs/day: 1.00    Types: Cigarettes    Quit date: 2021    Years since quitting: 2.2   Smokeless tobacco: Current    Types: Chew  Vaping Use   Vaping Use: Never used  Substance and Sexual Activity   Alcohol use: Not Currently   Drug use: Not Currently    Types: Cocaine   Sexual activity: Not Currently    Birth control/protection: Diaphragm  Other Topics Concern   Not on file  Social History Narrative   Not on file   Social Determinants of Health   Financial Resource Strain: Not on file  Food Insecurity: Not on file  Transportation Needs: Not on file  Physical Activity: Not on file  Stress: Not on file  Social Connections: Not on file  Intimate Partner Violence: Not on file   Social History   Tobacco Use  Smoking Status Former   Packs/day: 1.00   Types:  Cigarettes   Quit date: 2021   Years since quitting: 2.2  Smokeless Tobacco Current   Types: Chew   Social History   Substance and Sexual Activity  Alcohol Use Not Currently    Family History:  Family History  Family history unknown: Yes    Past medical history, surgical history, medications, allergies, family history and social history reviewed with patient today and changes made to appropriate areas of the chart.   Review of Systems  Eyes:  Negative for blurred vision and double vision.  Respiratory:  Negative for shortness of breath.   Cardiovascular:  Negative for chest pain, palpitations and leg swelling.  Musculoskeletal:  Positive for back pain.  Neurological:  Negative for dizziness and headaches.  All other ROS negative except what is listed above and in the HPI.      Objective:    BP 122/73   Pulse 73    Temp 97.7 F (36.5 C) (Oral)   Ht 6\' 2"  (1.88 m)   Wt 232 lb 12.8 oz (105.6 kg)   SpO2 98%   BMI 29.89 kg/m   Wt Readings from Last 3 Encounters:  10/12/21 232 lb 12.8 oz (105.6 kg)  09/10/21 227 lb 6.4 oz (103.1 kg)  08/31/21 220 lb (99.8 kg)    Physical Exam Vitals and nursing note reviewed.  Constitutional:      General: He is not in acute distress.    Appearance: Normal appearance. He is normal weight. He is not ill-appearing, toxic-appearing or diaphoretic.  HENT:     Head: Normocephalic.     Right Ear: Tympanic membrane, ear canal and external ear normal.     Left Ear: Tympanic membrane, ear canal and external ear normal.     Nose: Nose normal. No congestion or rhinorrhea.     Mouth/Throat:     Mouth: Mucous membranes are moist.  Eyes:     General:        Right eye: No discharge.        Left eye: No discharge.     Extraocular Movements: Extraocular movements intact.     Conjunctiva/sclera: Conjunctivae normal.     Pupils: Pupils are equal, round, and reactive to light.  Cardiovascular:     Rate and Rhythm: Normal rate and regular rhythm.     Heart sounds: No murmur heard. Pulmonary:     Effort: Pulmonary effort is normal. No respiratory distress.     Breath sounds: Normal breath sounds. No wheezing, rhonchi or rales.  Abdominal:     General: Abdomen is flat. Bowel sounds are normal. There is no distension.     Palpations: Abdomen is soft.     Tenderness: There is no abdominal tenderness. There is no guarding.  Musculoskeletal:     Cervical back: Normal range of motion and neck supple.  Skin:    General: Skin is warm and dry.     Capillary Refill: Capillary refill takes less than 2 seconds.  Neurological:     General: No focal deficit present.     Mental Status: He is alert and oriented to person, place, and time.     Cranial Nerves: No cranial nerve deficit.     Motor: No weakness.     Deep Tendon Reflexes: Reflexes normal.  Psychiatric:        Mood and  Affect: Mood normal.        Behavior: Behavior normal.        Thought Content: Thought content normal.  Judgment: Judgment normal.    Results for orders placed or performed in visit on 09/10/21  H. pylori antigen, stool  Result Value Ref Range   H pylori Ag, Stl Negative Negative      Assessment & Plan:   Problem List Items Addressed This Visit       Other   Low back pain radiating down leg    Chronic. Previous imaging reviewed.  Has S1 nerve root impingement and spinal Stenosis at L4-5.  Lyrica makes pain more tolerable.  Does not take the pain away completely.  Will continue with current dose of Lyrica.  Refill sent today.  Follow up in 3 months for reevaluation.      Bipolar 1 disorder, depressed (HCC)    Chronic. Does not follow up with Behavioral Health.  Did not feel like the medication was working or him.  Recommend following up with them for other treatment options.      Other Visit Diagnoses     Annual physical exam    -  Primary   Health maintenance reviewed during visit today. Labs ordered. TDAP up to date.  Declined flu and pneumonia.   Relevant Orders   TSH   CBC with Differential/Platelet   Comprehensive metabolic panel   Urinalysis, Routine w reflex microscopic   Screening for cardiovascular condition       Relevant Orders   Lipid panel   Screening for HIV without presence of risk factors       Relevant Orders   HIV antibody (with reflex)   Encounter for hepatitis C screening test for low risk patient       Relevant Orders   Hepatitis C antibody        Discussed aspirin prophylaxis for myocardial infarction prevention and decision was it was not indicated  LABORATORY TESTING:  Health maintenance labs ordered today as discussed above.    IMMUNIZATIONS:   - Tdap: Tetanus vaccination status reviewed: last tetanus booster within 10 years. - Influenza: Refused - Pneumovax: Not applicable - Prevnar: Not applicable - COVID: Not applicable -  HPV: Not applicable - Shingrix vaccine: Not applicable  SCREENING: - Colonoscopy: Not applicable  Discussed with patient purpose of the colonoscopy is to detect colon cancer at curable precancerous or early stages   - AAA Screening: Not applicable  -Hearing Test: Not applicable  -Spirometry: Not applicable   PATIENT COUNSELING:    Sexuality: Discussed sexually transmitted diseases, partner selection, use of condoms, avoidance of unintended pregnancy  and contraceptive alternatives.   Advised to avoid cigarette smoking.  I discussed with the patient that most people either abstain from alcohol or drink within safe limits (<=14/week and <=4 drinks/occasion for males, <=7/weeks and <= 3 drinks/occasion for females) and that the risk for alcohol disorders and other health effects rises proportionally with the number of drinks per week and how often a drinker exceeds daily limits.  Discussed cessation/primary prevention of drug use and availability of treatment for abuse.   Diet: Encouraged to adjust caloric intake to maintain  or achieve ideal body weight, to reduce intake of dietary saturated fat and total fat, to limit sodium intake by avoiding high sodium foods and not adding table salt, and to maintain adequate dietary potassium and calcium preferably from fresh fruits, vegetables, and low-fat dairy products.    stressed the importance of regular exercise  Injury prevention: Discussed safety belts, safety helmets, smoke detector, smoking near bedding or upholstery.   Dental health: Discussed importance of regular  tooth brushing, flossing, and dental visits.   Follow up plan: NEXT PREVENTATIVE PHYSICAL DUE IN 1 YEAR. Return in about 3 months (around 01/12/2022) for Lyrica.

## 2021-10-13 LAB — COMPREHENSIVE METABOLIC PANEL
ALT: 20 IU/L (ref 0–44)
AST: 21 IU/L (ref 0–40)
Albumin/Globulin Ratio: 2 (ref 1.2–2.2)
Albumin: 4.7 g/dL (ref 4.0–5.0)
Alkaline Phosphatase: 79 IU/L (ref 44–121)
BUN/Creatinine Ratio: 11 (ref 9–20)
BUN: 12 mg/dL (ref 6–24)
Bilirubin Total: 0.2 mg/dL (ref 0.0–1.2)
CO2: 25 mmol/L (ref 20–29)
Calcium: 9.7 mg/dL (ref 8.7–10.2)
Chloride: 101 mmol/L (ref 96–106)
Creatinine, Ser: 1.09 mg/dL (ref 0.76–1.27)
Globulin, Total: 2.4 g/dL (ref 1.5–4.5)
Glucose: 122 mg/dL — ABNORMAL HIGH (ref 70–99)
Potassium: 4.1 mmol/L (ref 3.5–5.2)
Sodium: 142 mmol/L (ref 134–144)
Total Protein: 7.1 g/dL (ref 6.0–8.5)
eGFR: 87 mL/min/{1.73_m2} (ref >59)

## 2021-10-13 LAB — CBC WITH DIFFERENTIAL/PLATELET
Basophils Absolute: 0.1 10*3/uL (ref 0.0–0.2)
Basos: 1 %
EOS (ABSOLUTE): 0.3 10*3/uL (ref 0.0–0.4)
Eos: 4 %
Hematocrit: 46.5 % (ref 37.5–51.0)
Hemoglobin: 16.2 g/dL (ref 13.0–17.7)
Immature Grans (Abs): 0 10*3/uL (ref 0.0–0.1)
Immature Granulocytes: 0 %
Lymphocytes Absolute: 2.5 10*3/uL (ref 0.7–3.1)
Lymphs: 26 %
MCH: 30.1 pg (ref 26.6–33.0)
MCHC: 34.8 g/dL (ref 31.5–35.7)
MCV: 86 fL (ref 79–97)
Monocytes Absolute: 0.7 10*3/uL (ref 0.1–0.9)
Monocytes: 8 %
Neutrophils Absolute: 5.9 10*3/uL (ref 1.4–7.0)
Neutrophils: 61 %
Platelets: 290 10*3/uL (ref 150–450)
RBC: 5.38 x10E6/uL (ref 4.14–5.80)
RDW: 12.5 % (ref 11.6–15.4)
WBC: 9.5 10*3/uL (ref 3.4–10.8)

## 2021-10-13 LAB — HEPATITIS C ANTIBODY: Hep C Virus Ab: NONREACTIVE

## 2021-10-13 LAB — LIPID PANEL
Chol/HDL Ratio: 4.4 ratio (ref 0.0–5.0)
Cholesterol, Total: 173 mg/dL (ref 100–199)
HDL: 39 mg/dL — ABNORMAL LOW (ref >39)
LDL Chol Calc (NIH): 93 mg/dL (ref 0–99)
Triglycerides: 244 mg/dL — ABNORMAL HIGH (ref 0–149)
VLDL Cholesterol Cal: 41 mg/dL — ABNORMAL HIGH (ref 5–40)

## 2021-10-13 LAB — HIV ANTIBODY (ROUTINE TESTING W REFLEX): HIV Screen 4th Generation wRfx: NONREACTIVE

## 2021-10-13 LAB — TSH: TSH: 0.429 u[IU]/mL — ABNORMAL LOW (ref 0.450–4.500)

## 2021-10-13 NOTE — Addendum Note (Signed)
Addended by: Larae Grooms on: 10/13/2021 08:46 AM ? ? Modules accepted: Orders ? ?

## 2021-10-13 NOTE — Progress Notes (Signed)
Please let patient know that overall his lab work looks good.  Cholesterol, specifically the triglycerides are elevated.  I recommend decreasing processed foods and refined sugar intake.  His thyroid level is slightly low.  This is something we will monitor in the future.   ? ?His sugar is elevated. I would like him to come back and have an A1c checked which is the measure of diabetes.  I have placed this order.  Please make him a lab appt.  Other blood work looks good.  Please me know if he has any questions.  ?

## 2021-10-20 ENCOUNTER — Other Ambulatory Visit: Payer: 59

## 2021-10-20 ENCOUNTER — Other Ambulatory Visit: Payer: Self-pay

## 2021-10-20 DIAGNOSIS — R7301 Impaired fasting glucose: Secondary | ICD-10-CM

## 2021-10-21 ENCOUNTER — Ambulatory Visit: Payer: 59 | Admitting: Nurse Practitioner

## 2021-10-21 LAB — HEMOGLOBIN A1C
Est. average glucose Bld gHb Est-mCnc: 128 mg/dL
Hgb A1c MFr Bld: 6.1 % — ABNORMAL HIGH (ref 4.8–5.6)

## 2021-10-21 NOTE — Progress Notes (Signed)
Hi Jose Mitchell. Your A1c came back showing that you are prediabetic.  No medications at this time.  I recommend you decrease your carbohydrate intake and exercise to help control this.  Please let me know if you have any questions.

## 2021-10-26 ENCOUNTER — Telehealth: Payer: Self-pay | Admitting: Specialist

## 2021-10-26 NOTE — Telephone Encounter (Signed)
[facility] called stating that pt already was administrated injection with Dr. Otelia Sergeant and nothing for them to for pt. If any question call CPR Phys Med at [phone]. ?

## 2021-10-30 ENCOUNTER — Other Ambulatory Visit: Payer: Self-pay | Admitting: Nurse Practitioner

## 2021-10-30 NOTE — Telephone Encounter (Signed)
Requested medication (s) are due for refill today: No ? ?Requested medication (s) are on the active medication list: No ? ?Last refill:  10/12/21 ? ?Future visit scheduled: Yes ? ?Notes to clinic:  Unable to refill per protocol, cannot delegate. ? ? ? ?  ?Requested Prescriptions  ?Pending Prescriptions Disp Refills  ? pregabalin (LYRICA) 150 MG capsule 180 capsule 0  ?  Sig: Take 1 capsule (150 mg total) by mouth 2 (two) times daily.  ?  ? Not Delegated - Neurology:  Anticonvulsants - Controlled - pregabalin Failed - 10/30/2021  9:43 AM  ?  ?  Failed - This refill cannot be delegated  ?  ?  Passed - Cr in normal range and within 360 days  ?  Creatinine, Ser  ?Date Value Ref Range Status  ?10/12/2021 1.09 0.76 - 1.27 mg/dL Final  ?  ?  ?  ?  Passed - Completed PHQ-2 or PHQ-9 in the last 360 days  ?  ?  Passed - Valid encounter within last 12 months  ?  Recent Outpatient Visits   ? ?      ? 2 weeks ago Annual physical exam  ? [facility] Larae Grooms, NP  ? 1 month ago Gastroesophageal reflux disease, unspecified whether esophagitis present  ? [facility] Larae Grooms, NP  ? ?  ?  ?Future Appointments   ? ?        ? In 2 months Larae Grooms, NP [facility]  ? ?  ? ?  ?  ?  ? ? ?

## 2021-10-30 NOTE — Telephone Encounter (Signed)
Pt calling to check on the status of this Rx, pt wanted to know if it will be sent in, please advise.  ?

## 2021-10-30 NOTE — Telephone Encounter (Signed)
Pt called back to report that he will be completely out after 2 days. Seeking refill  ?

## 2021-10-30 NOTE — Telephone Encounter (Signed)
Medication Refill - Medication:  ?pregabalin (LYRICA) 150 MG capsule  ? ?Has the patient contacted their pharmacy? Yes.   ?Contact PCP ? ?Preferred Pharmacy (with phone number or street name):  ?[place], [place] - [id number]  ?[address] [place] [address]  ?Phone:  [phone]  Fax:  [phone]  ? ?Has the patient been seen for an appointment in the last year OR does the patient have an upcoming appointment? Yes.   ? ?Agent: Please be advised that RX refills may take up to 3 business days. We ask that you follow-up with your pharmacy. ?

## 2021-10-30 NOTE — Telephone Encounter (Signed)
Pt is calling to let Santiago Glad know that he has found a bottle of his medication. And he no longer needs the refill. ?

## 2021-10-30 NOTE — Telephone Encounter (Signed)
Pt no longer needing refill ?Requested Prescriptions  ?Pending Prescriptions Disp Refills  ?? pregabalin (LYRICA) 150 MG capsule 180 capsule 0  ?  Sig: Take 1 capsule (150 mg total) by mouth 2 (two) times daily.  ?  ? Not Delegated - Neurology:  Anticonvulsants - Controlled - pregabalin Failed - 10/30/2021  5:45 PM  ?  ?  Failed - This refill cannot be delegated  ?  ?  Passed - Cr in normal range and within 360 days  ?  Creatinine, Ser  ?Date Value Ref Range Status  ?10/12/2021 1.09 0.76 - 1.27 mg/dL Final  ?   ?  ?  Passed - Completed PHQ-2 or PHQ-9 in the last 360 days  ?  ?  Passed - Valid encounter within last 12 months  ?  Recent Outpatient Visits   ?      ? 2 weeks ago Annual physical exam  ? [facility] Larae Grooms, NP  ? 1 month ago Gastroesophageal reflux disease, unspecified whether esophagitis present  ? [facility] Larae Grooms, NP  ?  ?  ?Future Appointments   ?        ? In 2 months Larae Grooms, NP [facility], [facility]  ?  ? ?  ?  ?  ? ? ?

## 2021-11-02 ENCOUNTER — Ambulatory Visit: Payer: Self-pay | Admitting: *Deleted

## 2021-11-02 NOTE — Telephone Encounter (Signed)
?  Chief Complaint: request appt  ?Symptoms: left wrist pain , swelling on thumb side . Feels like "toothache". Pain from fingers to elbow. Picked up fry pan and emptied contents and noted pain.  ?Frequency: happened Friday or Saturday  ?Pertinent Negatives: Patient denies fever, redness swelling in arm ?Disposition: [] ED /[] Urgent Care (no appt availability in office) / [x] Appointment(In office/virtual)/ []  [facility]/ [] Home Care/ [] Refused Recommended Disposition /[] [place] [facility]/ []  Follow-up with PCP ?Additional Notes:  ? ?Appt scheduled for 11/03/21 with temporary provider.  ? ? Reason for Disposition ? SEVERE joint swelling (e.g., can barely bend or move wrist joint) ? ?Answer Assessment - Initial Assessment Questions ?1. ONSET: "When did the swelling start?" (e.g., minutes, hours, days, weeks) ?    Friday or Saturday after picking up fry pan and turning wrist to empty contents ?2. LOCATION: "What part of the wrist is swollen?"  "Are both wrists swollen or just one wrist?" ?    Top of arm, wrist on thumb side  ?3. SEVERITY: "How bad is the swelling?"  ?  - BALL OR LUMP: small ball or lump ?  - SKIN ONLY: localized; puffy or swollen area or patch of skin ?  - MILD JOINT SWELLING: joint feels or looks mildly swollen or puffy ?  - MODERATE JOINT SWELLING: moderate joint swelling; looks swollen ?  - SEVERE JOINT SWELLING:  severe joint swelling; can barely bend or move joint ?    Mild  ?4. RECURRENT SYMPTOM: "Have you had wrist swelling before?" If Yes, ask: "When was the last time?" "What happened that time?" ?    Na  ?5. CAUSE: "What do you think is causing the wrist swelling?" (e.g., arthritis, ganglion cyst, insect bite, recent injury) ?    Not sure  ?6. OTHER SYMPTOMS: "Do you have any other symptoms?" (e.g., fever, hand pain) ?    Pain like toothache from fingers to elbow  ?7. PREGNANCY: "Is there any chance you are pregnant?" "When was your last menstrual period?" ?    na ? ?Protocols  used: Wrist Swelling-A-AH ? ?

## 2021-11-02 NOTE — Telephone Encounter (Signed)
Referral faxed to [facility] ?

## 2021-11-03 ENCOUNTER — Ambulatory Visit
Admission: RE | Admit: 2021-11-03 | Discharge: 2021-11-03 | Disposition: A | Discharge status: Home / Self Care | Payer: 59 | Attending: Unknown Physician Specialty | Admitting: Unknown Physician Specialty

## 2021-11-03 ENCOUNTER — Ambulatory Visit (INDEPENDENT_AMBULATORY_CARE_PROVIDER_SITE_OTHER): Payer: 59 | Admitting: Unknown Physician Specialty

## 2021-11-03 ENCOUNTER — Ambulatory Visit
Admission: RE | Admit: 2021-11-03 | Discharge: 2021-11-03 | Disposition: A | Discharge status: Home / Self Care | Payer: 59 | Source: Ambulatory Visit | Attending: Unknown Physician Specialty | Admitting: Unknown Physician Specialty

## 2021-11-03 ENCOUNTER — Telehealth: Payer: Self-pay | Admitting: Nurse Practitioner

## 2021-11-03 ENCOUNTER — Encounter: Payer: Self-pay | Admitting: Unknown Physician Specialty

## 2021-11-03 VITALS — BP 117/78 | HR 55 | Temp 97.8°F | Wt 223.2 lb

## 2021-11-03 DIAGNOSIS — M79602 Pain in left arm: Secondary | ICD-10-CM | POA: Insufficient documentation

## 2021-11-03 DIAGNOSIS — M654 Radial styloid tenosynovitis [de Quervain]: Secondary | ICD-10-CM | POA: Diagnosis not present

## 2021-11-03 MED ORDER — DICLOFENAC SODIUM 1 % EX GEL: 4.0000 g | Freq: Four times a day (QID) | CUTANEOUS | 0 refills | Status: DC

## 2021-11-03 NOTE — Patient Instructions (Signed)
Thumb spica

## 2021-11-03 NOTE — Telephone Encounter (Signed)
Copied from [place] [id number]. Topic: General - Other ?>> Nov 03, 2021  3:18 PM Valere Dross wrote: ?Reason for CRM: Pt called in about his imaging, requesting if he can get a call when they come through, please advise. ?

## 2021-11-03 NOTE — Progress Notes (Signed)
? ?BP 117/78   Pulse (!) 55   Temp 97.8 ?F (36.6 ?C) (Oral)   Wt 223 lb 3.2 oz (101.2 kg)   SpO2 99%   BMI 28.66 kg/m?   ? ?Subjective:  ? ? Patient ID: Jose Mitchell, male    DOB: Dec 07, 1980, 41 y.o.   MRN: [id number] ? ?HPI: ?Jose Mitchell is a 41 y.o. male ? ?Chief Complaint  ?Patient presents with  ? Wrist Pain  ?  L wrist pain onset Friday, states no fall/injury to wrist. States some swelling on wrist yesterday.   ? ?Wrist Pain  ?The pain is present in the left wrist. This is a new problem. The current episode started in the past 7 days. There has been no history of extremity trauma. The problem occurs constantly. The problem has been unchanged. The quality of the pain is described as aching. The pain is severe. Associated symptoms include joint swelling and stiffness. Pertinent negatives include no fever or numbness. The symptoms are aggravated by activity. He has tried acetaminophen for the symptoms. The treatment provided no relief. His past medical history is significant for osteoarthritis. Left shoulder.  ? ? ?Relevant past medical, surgical, family and social history reviewed and updated as indicated. Interim medical history since our last visit reviewed. ?Allergies and medications reviewed and updated. ? ?Review of Systems  ?Constitutional:  Negative for fever.  ?Musculoskeletal:  Positive for stiffness.  ?Neurological:  Negative for numbness.  ? ?Per HPI unless specifically indicated above ? ?   ?Objective:  ?  ?BP 117/78   Pulse (!) 55   Temp 97.8 ?F (36.6 ?C) (Oral)   Wt 223 lb 3.2 oz (101.2 kg)   SpO2 99%   BMI 28.66 kg/m?   ?Wt Readings from Last 3 Encounters:  ?11/03/21 223 lb 3.2 oz (101.2 kg)  ?10/12/21 232 lb 12.8 oz (105.6 kg)  ?09/10/21 227 lb 6.4 oz (103.1 kg)  ?  ?Physical Exam ?Constitutional:   ?   General: He is not in acute distress. ?   Appearance: Normal appearance. He is well-developed.  ?HENT:  ?   Head: Normocephalic and atraumatic.  ?Eyes:  ?   General: Lids are normal.  No scleral icterus.    ?   Right eye: No discharge.     ?   Left eye: No discharge.  ?   Conjunctiva/sclera: Conjunctivae normal.  ?Neck:  ?   Vascular: No carotid bruit or JVD.  ?Cardiovascular:  ?   Heart sounds: Normal heart sounds.  ?Pulmonary:  ?   Effort: Pulmonary effort is normal. No respiratory distress.  ?   Breath sounds: Normal breath sounds.  ?Abdominal:  ?   Palpations: There is no hepatomegaly or splenomegaly.  ?Musculoskeletal:     ?   General: Normal range of motion.  ?   Cervical back: Normal range of motion and neck supple.  ?   Comments: Pt with point tenderness left forearm at Quervain's insertion.    ?Skin: ?   General: Skin is warm and dry.  ?   Coloration: Skin is not pale.  ?   Findings: No rash.  ?Neurological:  ?   Mental Status: He is alert and oriented to person, place, and time.  ?Psychiatric:     ?   Behavior: Behavior normal.     ?   Thought Content: Thought content normal.     ?   Judgment: Judgment normal.  ? ? ?Results for orders placed or performed in visit  on 10/20/21  ?HgB A1c  ?Result Value Ref Range  ? Hgb A1c MFr Bld 6.1 (H) 4.8 - 5.6 %  ? Est. average glucose Bld gHb Est-mCnc 128 mg/dL  ? ?   ?Assessment & Plan:  ? ?Problem List Items Addressed This Visit   ?None ?Visit Diagnoses   ? ? Left arm pain    -  Primary  ? Will x-ray due to point tenderness.    ? Relevant Orders  ? DG Forearm Left  ? Tenosynovitis, de Quervain      ? Recommended thumb spica. 2 days off work.  Wear splint when he returns.  Voltaren gel and Naprosyn he has on hand.    ? ?  ?  ? ?Follow up plan: ?Return if symptoms worsen or fail to improve. ? ? ? ? ? ?

## 2021-11-04 ENCOUNTER — Encounter: Payer: Self-pay | Admitting: Unknown Physician Specialty

## 2021-11-04 NOTE — Telephone Encounter (Signed)
Routing to provider for results when available.  ?

## 2021-11-04 NOTE — Progress Notes (Signed)
Contacted via MyChart ? ? ?Good evening Uriyah, your imaging has returned and no abnormal findings.  Please ensure to maintain follow-up visits.

## 2021-11-05 NOTE — Telephone Encounter (Signed)
Patient notified of results.

## 2021-11-10 ENCOUNTER — Ambulatory Visit: Payer: Self-pay | Admitting: *Deleted

## 2021-11-10 ENCOUNTER — Telehealth: Payer: Self-pay | Admitting: Nurse Practitioner

## 2021-11-10 NOTE — Telephone Encounter (Signed)
Routing to provider who saw the patient.  

## 2021-11-10 NOTE — Telephone Encounter (Signed)
?  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  ?

## 2021-11-10 NOTE — Telephone Encounter (Signed)
Reason for Disposition ? MILD OR MODERATE joint swelling (e.g., feels or looks mildly swollen or puffy) ? ?Answer Assessment - Initial Assessment Questions ?1. ONSET: "When did the swelling start?" (e.g., minutes, hours, days, weeks) ?    2 weeks ?2. LOCATION: "What part of the wrist is swollen?"  "Are both wrists swollen or just one wrist?" ?    Left wrist ?3. SEVERITY: "How bad is the swelling?"  ?  - BALL OR LUMP: small ball or lump ?  - SKIN ONLY: localized; puffy or swollen area or patch of skin ?  - MILD JOINT SWELLING: joint feels or looks mildly swollen or puffy ?  - MODERATE JOINT SWELLING: moderate joint swelling; looks swollen ?  - SEVERE JOINT SWELLING:  severe joint swelling; can barely bend or move joint ?    mild ?4. RECURRENT SYMPTOM: "Have you had wrist swelling before?" If Yes, ask: "When was the last time?" "What happened that time?" ?    no ?5. CAUSE: "What do you think is causing the wrist swelling?" (e.g., arthritis, ganglion cyst, insect bite, recent injury) ?    Injury- pain present Friday night ?6. OTHER SYMPTOMS: "Do you have any other symptoms?" (e.g., fever, hand pain) ?    Pain in wrist ?7. PREGNANCY: "Is there any chance you are pregnant?" "When was your last menstrual period?" ? ?Protocols used: Wrist Swelling-A-AH ? ?

## 2021-11-12 NOTE — Telephone Encounter (Signed)
LVM asking patient to call back to schedule an appointment 

## 2021-11-17 ENCOUNTER — Ambulatory Visit: Payer: 59 | Admitting: Internal Medicine

## 2021-11-24 ENCOUNTER — Encounter: Payer: Self-pay | Admitting: Unknown Physician Specialty

## 2021-11-24 ENCOUNTER — Ambulatory Visit (INDEPENDENT_AMBULATORY_CARE_PROVIDER_SITE_OTHER): Payer: 59 | Admitting: Unknown Physician Specialty

## 2021-11-24 VITALS — BP 133/78 | HR 56 | Temp 98.7°F | Ht 74.02 in | Wt 226.4 lb

## 2021-11-24 DIAGNOSIS — G8929 Other chronic pain: Secondary | ICD-10-CM | POA: Insufficient documentation

## 2021-11-24 DIAGNOSIS — M25511 Pain in right shoulder: Secondary | ICD-10-CM | POA: Diagnosis not present

## 2021-11-24 DIAGNOSIS — M79606 Pain in leg, unspecified: Secondary | ICD-10-CM | POA: Diagnosis not present

## 2021-11-24 DIAGNOSIS — M545 Low back pain, unspecified: Secondary | ICD-10-CM

## 2021-11-24 HISTORY — DX: Other chronic pain: G89.29

## 2021-11-24 MED ORDER — METHYLPREDNISOLONE 4 MG PO TBPK: ORAL_TABLET | ORAL | 0 refills | Status: DC

## 2021-11-24 NOTE — Assessment & Plan Note (Signed)
Hx of herniated disc.  No radiation at this time, but very tight hamstrings and muscle spasm.   ?

## 2021-11-24 NOTE — Assessment & Plan Note (Signed)
This is ongoing and looks to be related to [facility] arthritis.  Will refer to PT.  Medrol dose pack for now.   ?

## 2021-11-24 NOTE — Progress Notes (Signed)
? ?BP 133/78   Pulse (!) 56   Temp 98.7 ?F (37.1 ?C) (Oral)   Ht 6' 2.02" (1.88 m)   Wt 226 lb 6.4 oz (102.7 kg)   SpO2 99%   BMI 29.06 kg/m?   ? ?Subjective:  ? ? Patient ID: Jose Mitchell, male    DOB: [birth year], 41 y.o.   MRN: [id number] ? ?HPI: ?[name] is a 41 y.o. male ? ?Chief Complaint  ?Patient presents with  ? Wrist Pain  ?  Left wrist pain seems to have resolved.  ? Shoulder Pain  ?  Right shoulder pain , Seen doctor in [place], had an injection is still painful  ? Back Pain  ? ?Shoulder Pain  ?The pain is present in the right shoulder. This is a chronic problem. Episode onset: a few months with sudden pain. The problem occurs constantly. The problem has been unchanged. Pertinent negatives include no limited range of motion, numbness or tingling. Exacerbated by: movement. Treatments tried: "all kinds of stuff" The treatment provided no relief.  ?Back Pain ?This is a chronic problem. Episode onset: couple of years. The problem occurs constantly. The problem is unchanged. The pain is present in the lumbar spine. The pain does not radiate. The symptoms are aggravated by bending and sitting. Pertinent negatives include no bowel incontinence, headaches, numbness, paresthesias, tingling or weakness.  ? ?Chart review shows history of herniated disk.  Refused surgery at the time.   ? ?Relevant past medical, surgical, family and social history reviewed and updated as indicated. Interim medical history since our last visit reviewed. ?Allergies and medications reviewed and updated. ? ?Review of Systems  ?Gastrointestinal:  Negative for bowel incontinence.  ?Musculoskeletal:  Positive for back pain.  ?Neurological:  Negative for tingling, weakness, numbness, headaches and paresthesias.  ? ?Per HPI unless specifically indicated above ? ?   ?Objective:  ?  ?BP 133/78   Pulse (!) 56   Temp 98.7 ?F (37.1 ?C) (Oral)   Ht 6' 2.02" (1.88 m)   Wt 226 lb 6.4 oz (102.7 kg)   SpO2 99%   BMI 29.06 kg/m?    ?Wt Readings from Last 3 Encounters:  ?11/24/21 226 lb 6.4 oz (102.7 kg)  ?11/03/21 223 lb 3.2 oz (101.2 kg)  ?10/12/21 232 lb 12.8 oz (105.6 kg)  ?  ?Physical Exam ?Constitutional:   ?   General: He is not in acute distress. ?   Appearance: Normal appearance. He is well-developed.  ?HENT:  ?   Head: Normocephalic and atraumatic.  ?Eyes:  ?   General: Lids are normal. No scleral icterus.    ?   Right eye: No discharge.     ?   Left eye: No discharge.  ?   Conjunctiva/sclera: Conjunctivae normal.  ?Neck:  ?   Vascular: No carotid bruit or JVD.  ?Cardiovascular:  ?   Rate and Rhythm: Normal rate and regular rhythm.  ?   Heart sounds: Normal heart sounds.  ?Pulmonary:  ?   Effort: Pulmonary effort is normal. No respiratory distress.  ?   Breath sounds: Normal breath sounds.  ?Abdominal:  ?   Palpations: There is no hepatomegaly or splenomegaly.  ?Musculoskeletal:     ?   General: Normal range of motion.  ?   Right shoulder: No swelling or deformity. Normal range of motion.  ?   Cervical back: Normal range of motion and neck supple.  ?   Lumbar back: No swelling, deformity, signs of trauma, spasms, tenderness  or bony tenderness. Negative right straight leg raise test and negative left straight leg raise test.  ?   Comments: Pain primarily in the [facility] joint  ?Skin: ?   General: Skin is warm and dry.  ?   Coloration: Skin is not pale.  ?   Findings: No rash.  ?Neurological:  ?   Mental Status: He is alert and oriented to person, place, and time.  ?Psychiatric:     ?   Behavior: Behavior normal.     ?   Thought Content: Thought content normal.     ?   Judgment: Judgment normal.  ? ? ?Results for orders placed or performed in visit on 10/20/21  ?HgB A1c  ?Result Value Ref Range  ? Hgb A1c MFr Bld 6.1 (H) 4.8 - 5.6 %  ? Est. average glucose Bld gHb Est-mCnc 128 mg/dL  ? ?   ?Assessment & Plan:  ? ?Problem List Items Addressed This Visit   ? ?  ? Unprioritized  ? Chronic right shoulder pain  ?  This is ongoing and looks to be  related to [facility] arthritis.  Will refer to PT.  Medrol dose pack for now.   ? ?  ?  ? Relevant Medications  ? methylPREDNISolone (MEDROL DOSEPAK) 4 MG TBPK tablet  ? Other Relevant Orders  ? Ambulatory referral to Physical Therapy  ? Ambulatory referral to Orthopedic Surgery  ? Low back pain radiating down leg - Primary  ?  Hx of herniated disc.  No radiation at this time, but very tight hamstrings and muscle spasm.   ? ?  ?  ? Relevant Medications  ? methylPREDNISolone (MEDROL DOSEPAK) 4 MG TBPK tablet  ? Other Relevant Orders  ? Ambulatory referral to Physical Therapy  ? Ambulatory referral to Orthopedic Surgery  ?  ?Refer to PT for ongoing and chronic orthopedic issues.  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  Will also refer to Orthopedics for ongoing issues for sustainable management.  Pt does not want surgery.  I have told him we do not manage disability and our goal would be to get him to where his orthopedic issues are manageable while doing his job.   ? ?Follow up plan: ?Return if symptoms worsen or fail to improve. ? ? ? ? ? ?

## 2021-11-27 ENCOUNTER — Telehealth: Payer: Self-pay

## 2021-11-27 NOTE — Telephone Encounter (Signed)
Copied from CRM [id number]. Topic: Referral - Question ?>> Nov 26, 2021  3:48 PM Traci Sermon wrote: ?Reason for CRM: Pt called in stating his referral to the ortho didn't accept his insurance, and the other he has seen before and did not want to have to see a specialist and is requesting a call back to talk about that, please advise. ?

## 2021-11-27 NOTE — Telephone Encounter (Signed)
Returned patient phone call, no answer LVM advising patient we will reach out to referral coordinator to get referral sent somewhere else.  ?

## 2021-11-30 ENCOUNTER — Ambulatory Visit: Payer: 59 | Admitting: Specialist

## 2021-11-30 NOTE — Telephone Encounter (Signed)
Pt returned call stating he still has wrist pain, please advise if another referral can be sent in.  ?

## 2021-11-30 NOTE — Telephone Encounter (Signed)
Pt made an additional call , please advise.  

## 2021-12-02 NOTE — Therapy (Incomplete)
?OUTPATIENT PHYSICAL THERAPY EVALUATION ? ? ?Patient Name: Jose Mitchell ?MRN: [id number] ?DOB:May 16, 1981, 41 y.o., male ?Today's Date: 12/02/2021 ? ? ? ?Past Medical History:  ?Diagnosis Date  ? Alcohol abuse   ? Bipolar 1 disorder (HCC)   ? Cocaine abuse (HCC)   ? Schizophrenia (HCC)   ? ?No past surgical history on file. ?Patient Active Problem List  ? Diagnosis Date Noted  ? Chronic right shoulder pain 11/24/2021  ? Restless leg 04/08/2020  ? Bipolar 1 disorder, depressed (HCC) 04/08/2020  ? Bipolar 2 disorder, major depressive episode (HCC) 04/08/2020  ? Low back pain radiating down leg 02/29/2020  ? ? ?PCP: *** ? ?REFERRING PROVIDER: *** ? ?REFERRING DIAG: *** ? ?THERAPY DIAG:  ?No diagnosis found. ? ?ONSET DATE: *** ? ?SUBJECTIVE:                                                                                                                                                                                          ? ?SUBJECTIVE STATEMENT: ?*** ?PERTINENT HISTORY:  ?Patient is a 41 y.o. male who presents to outpatient physical therapy with a referral for medical diagnosis ***. This patient's chief complaints consist of ***, leading to the following functional deficits: ***. Relevant past medical history and comorbidities include alcohol abuse, biplar 1 disorder, cocaine abuse, schizophrenia, chronic right shoulder pain, restless leg, low back pian radiating down leg.  Patient denies hx of {redflags:27294} ? ?PAIN:  ?Are you having pain? Yes: NPRS scale: Current: ***/10,  Best: ***/10, Worst: ***/10. ?Pain location: *** ?Pain description: *** ?Aggravating factors: *** ?Relieving factors: ***  ? ?FUNCTIONAL LIMITATIONS: *** ? ? ?PRECAUTIONS: {Therapy precautions:24002} ? ?WEIGHT BEARING RESTRICTIONS {Yes ***/No:24003} ? ?FALLS:  ?Has patient fallen in last 6 months? {fallsyesno:27318} ? ?LIVING ENVIRONMENT: ?Lives with: {[facility] lives with:25569::"lives with their family"} ?Lives in: {Lives in:25570} ?Stairs:  {opstairs:27293} ?Has following equipment at home: {Assistive devices:23999} ? ?OCCUPATION: *** ? ?LEISURE: *** ? ?PLOF: {PLOF:24004} ? ?PATIENT GOALS *** ? ? ?OBJECTIVE ? ?DIAGNOSTIC FINDINGS:  ?Lumbar MRI report from 10/05/2020: ?MRI LUMBAR SPINE WITHOUT CONTRAST ?  ?TECHNIQUE: ?Multiplanar, multisequence MR imaging of the lumbar spine was ?performed. No intravenous contrast was administered. ?  ?COMPARISON:  Lumbar radiographs September 29, 2020. ?  ?FINDINGS: ?Segmentation:  Standard. ?  ?Alignment: No substantial sagittal subluxation. Broad ?dextrocurvature. ?  ?Vertebrae: Vertebral body heights are maintained. No specific ?evidence of acute fracture, discitis/osteomyelitis, or suspicious ?bone lesion. ?  ?Conus medullaris and cauda equina: Conus extends to the L1-L2 level. ?Conus appears normal. ?  ?Paraspinal and other soft tissues: Unremarkable ?  ?Disc levels: ?  ?T12-L1: No significant disc protrusion, foraminal stenosis,  or canal ?stenosis. ?  ?L1-L2: No significant disc protrusion, foraminal stenosis, or canal ?stenosis. ?  ?L2-L3: No significant disc protrusion, foraminal stenosis, or canal ?stenosis. ?  ?L3-L4: Small disc bulge and mild bilateral facet hypertrophy with ?ligamentum flavum thickening. Prominent dorsal epidural fat. Mild ?bilateral subarticular recess stenosis without significant central ?canal or foraminal stenosis. ?  ?L4-L5: Broad disc bulge with mild bilateral facet hypertrophy and ?ligamentum flavum thickening. Prominent dorsal epidural fat. ?Resulting mild canal and bilateral subarticular recess stenosis. ?Mild left foraminal stenosis. No significant right foraminal ?stenosis. ?  ?L5-S1: Large right subarticular disc protrusion with resulting ?severe right subarticular recess stenosis and suspected impingement ?of the descending right S1 nerve roots (series 5 and 6, image 40). ?No significant central canal stenosis or foraminal stenosis. ?  ?IMPRESSION: ?1. At L5-S1 a large right  subarticular disc protrusion results in ?severe right subarticular recess and suspected impingement of the ?descending right S1 nerve roots. ?2. Prominent dorsal epidural fat and degenerative change results in ?mild canal stenosis at L4-L5. Also, mild left foraminal stenosis at ?this level. ?  ? ?SELF- REPORTED FUNCTION ?FOTO score: ***/100 (lumbar spine questionnaire) ? ? ?OBSERVATION/INSPECTION ?Posture ?Posture (seated): forward head, rounded shoulders, slumped in sitting.  ?Posture (standing): *** ?Posture correction: *** ?Anthropometrics ?Tremor: none ?Body composition: *** ?Muscle bulk: *** ?Skin: The incision sites appear to be healing well with no excessive redness, warmth, drainage or signs of infection present.  *** ?Edema: *** ?Functional Mobility ?Bed mobility: *** ?Transfers: *** ?Gait: grossly WFL for household and short community ambulation. More detailed gait analysis deferred to later date as needed. *** ?Stairs: *** ? ?SPINE MOTION ? ?LUMBAR SPINE AROM ?*Indicates pain ?Flexion: *** ?Extension: *** ?Side Flexion:  ? R *** ? L *** ?Rotation:  ?R *** ?L *** ?Side glide:  ?R *** ?L *** ? ? ?NEUROLOGICAL ? ?Upper Motor Neuron Screen ?Babinski, Hoffman's and Clonus (ankle) negative bilaterally.  ?Dermatomes ?C2-T1 appears equal and intact to light touch except the following: *** ?L2-S2 appears equal and intact to light touch except the following: *** ?Deep Tendon Reflexes ?R/L  ?***+/***+ Biceps brachii reflex (C5, C6) ?***+/***+ Brachioradialis reflex (C6) ?***+/***+ Triceps brachii reflex (C7) ?***+/***+ Quadriceps reflex (L4) ?***+/***+ Achilles reflex (S1) ? ?SPINE MOTION ? ?CERVICAL SPINE AROM ?*Indicates pain ?Flexion: *** ?Extension: *** ?Side Flexion:  ? R *** ? L *** ?Rotation:  ?R *** ?L *** ? ? ?PERIPHERAL JOINT MOTION (in degrees) ? ?ACTIVE RANGE OF MOTION (AROM) ?*Indicates pain Date Date Date  ?Joint/Motion R/L R/L R/L  ?Shoulder     ?Flexion / / /  ?Extension / / /  ?Abduction  / / /   ?External rotation / / /  ?Internal rotation / / /  ?Elbow     ?Flexion  / / /  ?Extension  / / /  ?Wrist     ?Flexion / / /  ?Extension  / / /  ?Radial deviation / / /  ?Ulnar deviation / / /  ?Pronation / / /  ?Supination / / /  ?Hip     ?Flexion / / /  ?Extension  / / /  ?Abduction / / /  ?Adduction / / /  ?External rotation / / /  ?Internal rotation  / / /  ?Knee     ?Extension / / /  ?Flexoin / / /  ?Ankle/Foot     ?Dorsiflexion (knee ext) / / /  ?Dorsiflexion (knee flex) / / /  ?Plantarflexion / / /  ?  Everison / / /  ?Inversion / / /  ?Great toe extension / / /  ?Great toe flexion / / /  ?Comments:  ? ?PASSIVE RANGE OF MOTION (PROM) ?*Indicates pain Date Date Date  ?Joint/Motion R/L R/L R/L  ?Shoulder     ?Flexion / / /  ?Extension / / /  ?Abduction  / / /  ?External rotation / / /  ?Internal rotation / / /  ?Elbow     ?Flexion  / / /  ?Extension  / / /  ?Wrist     ?Flexion / / /  ?Extension  / / /  ?Radial deviation / / /  ?Ulnar deviation / / /  ?Pronation / / /  ?Supination / / /  ?Hip     ?Flexion  / / /  ?Extension  / / /  ?Abduction / / /  ?Adduction / / /  ?External rotation / / /  ?Internal rotation  / / /  ?Knee     ?Extension / / /  ?Flexoin / / /  ?Ankle/Foot     ?Dorsiflexion (knee ext) / / /  ?Dorsiflexion (knee flex) / / /  ?Plantarflexion / / /  ?Everison / / /  ?Inversion / / /  ?Great toe extension / / /  ?Great toe flexion / / /  ?Comments:  ? ?MUSCLE PERFORMANCE (MMT):  ?*Indicates pain Date Date Date  ?Joint/Motion R/L R/L R/L  ?Shoulder     ?Flexion / / /  ?Abduction (C5) / / /  ?External rotation / / /  ?Internal rotation / / /  ?Extension / / /  ?Elbow     ?Flexion (C6) / / /  ?Extension (C7) / / /  ?Wrist     ?Flexion (C7) / / /  ?Extension (C6) / / /  ?Radial deviation / / /  ?Ulnar deviation (C8) / / /  ?Pronation / / /  ?Supination / / /  ?Hand     ?Thumb extension (C8) / / /  ?Finger abduction (T1) / / /  ?Grip (C8) / / /  ?Hip     ?Flexion (L1, L2) / / /  ?Extension (knee ext) / /  /  ?Extension (knee flex) / / /  ?Abduction / / /  ?Adduction / / /  ?External rotation / / /  ?Internal rotation  / / /  ?Knee     ?Extension (L3) / / /  ?Flexion (S2) / / /  ?Ankle/Foot     ?Dorsiflexion (L4) / / /  ?G

## 2021-12-07 NOTE — Telephone Encounter (Signed)
error 

## 2021-12-08 ENCOUNTER — Ambulatory Visit: Payer: 59 | Admitting: Physical Therapy

## 2021-12-10 ENCOUNTER — Encounter: Payer: 59 | Admitting: Physical Therapy

## 2021-12-14 ENCOUNTER — Ambulatory Visit (INDEPENDENT_AMBULATORY_CARE_PROVIDER_SITE_OTHER): Payer: 59

## 2021-12-14 ENCOUNTER — Encounter: Payer: Self-pay | Admitting: Specialist

## 2021-12-14 ENCOUNTER — Ambulatory Visit (INDEPENDENT_AMBULATORY_CARE_PROVIDER_SITE_OTHER): Payer: 59 | Admitting: Specialist

## 2021-12-14 VITALS — BP 111/75 | HR 84 | Ht 74.0 in | Wt 227.0 lb

## 2021-12-14 DIAGNOSIS — S43431A Superior glenoid labrum lesion of right shoulder, initial encounter: Secondary | ICD-10-CM | POA: Diagnosis not present

## 2021-12-14 DIAGNOSIS — M25511 Pain in right shoulder: Secondary | ICD-10-CM

## 2021-12-14 DIAGNOSIS — M5126 Other intervertebral disc displacement, lumbar region: Secondary | ICD-10-CM

## 2021-12-14 MED ORDER — HYDROCODONE-ACETAMINOPHEN 10-325 MG PO TABS
1.0000 | ORAL_TABLET | Freq: Four times a day (QID) | ORAL | 0 refills | Status: DC | PRN
Start: 2021-12-14 — End: 2022-01-07

## 2021-12-14 MED ORDER — CYCLOBENZAPRINE HCL 10 MG PO TABS: 10.0000 mg | ORAL_TABLET | Freq: Three times a day (TID) | ORAL | 0 refills | Status: DC | PRN
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
## 2021-12-14 NOTE — Patient Instructions (Signed)
Avoid overhead lifting and overhead use of the arms. Pillows to keep from sleeping directly on the shoulders Limited lifting to less than 10 lbs. Ice or heat for relief. NSAIDs are helpful, such as alleve or motrin, be careful not to use in excess as they place burdens on the kidney. Stretching exercise help and strengthening is helpful to build endurance.  Avoid frequent bending and stooping  No lifting greater than 10 lbs. May use ice or moist heat for pain. Weight loss is of benefit. Best medication for lumbar disc disease is arthritis medications like motrin, celebrex and naprosyn. Exercise is important to improve your indurance and does allow people to function better inspite of back pain.  Will order right shoulder MRI with enhancement, MR arthrogram to determine if there is a labral tear of slap injury the right shoulder.

## 2021-12-15 ENCOUNTER — Ambulatory Visit: Payer: Self-pay | Admitting: *Deleted

## 2021-12-15 ENCOUNTER — Ambulatory Visit: Payer: 59 | Admitting: Physical Therapy

## 2021-12-15 ENCOUNTER — Encounter: Payer: 59 | Admitting: Physical Therapy

## 2021-12-15 NOTE — Telephone Encounter (Signed)
  Chief Complaint: request appt tick bite red swollen  Symptoms: redness , swelling itching behind knee on left leg. Tick tiny with white dot, removed yesterday  Frequency: yesterday  Pertinent Negatives: Patient denies fever, no rash  Disposition: [] ED /[] Urgent Care (no appt availability in office) / [x] Appointment(In office/virtual)/ []  [facility]/ [] Home Care/ [] Refused Recommended Disposition /[] [facility]/ []  Follow-up with PCP Additional Notes:   Appt tomorrow    Reason for Disposition  Red ring or bull's-eye rash occurs at tick bite  Answer Assessment - Initial Assessment Questions 1. TYPE of TICK: "Is it a wood tick or a deer tick?" (e.g., deer tick, wood tick; unsure)     Not sure  2. SIZE of TICK: "How big is the tick?" (e.g., size of poppy seed, apple seed, watermelon seed; unsure) Note: Deer ticks can be the size of a poppy seed (nymph) or an apple seed (adult).       Tiny with white dot  3. ENGORGED: "Did the tick look flat or engorged (full, swollen)?" (e.g., flat, engorged; unsure)     Flat  4. LOCATION: "Where is the tick bite located?"      Left leg behind knee 5. ONSET: "How long do you think the tick was attached before you removed it?" (e.g., 5 hours, 2 days)      "No idea " 6. APPEARANCE of BITE or RASH: "What does the site look like?"     Red swollen , itching  7. PREGNANCY: "Is there any chance you are pregnant?" "When was your last menstrual period?"     na  Protocols used: Tick Bite-A-AH

## 2021-12-16 ENCOUNTER — Ambulatory Visit: Payer: 59 | Admitting: Family Medicine

## 2021-12-17 ENCOUNTER — Encounter: Payer: 59 | Admitting: Physical Therapy

## 2021-12-17 ENCOUNTER — Ambulatory Visit: Payer: Self-pay
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
## 2021-12-18 ENCOUNTER — Telehealth (INDEPENDENT_AMBULATORY_CARE_PROVIDER_SITE_OTHER): Payer: 59 | Admitting: Physician Assistant

## 2021-12-18 ENCOUNTER — Other Ambulatory Visit: Payer: Self-pay | Admitting: Physician Assistant

## 2021-12-18 ENCOUNTER — Encounter: Payer: Self-pay | Admitting: Physician Assistant

## 2021-12-18 ENCOUNTER — Ambulatory Visit: Payer: Self-pay

## 2021-12-18 DIAGNOSIS — F319 Bipolar disorder, unspecified: Secondary | ICD-10-CM

## 2021-12-18 DIAGNOSIS — F411 Generalized anxiety disorder: Secondary | ICD-10-CM | POA: Insufficient documentation

## 2021-12-18 HISTORY — DX: Generalized anxiety disorder: F41.1

## 2021-12-18 MED ORDER — VALPROIC ACID 250 MG PO CAPS: 250.0000 mg | ORAL_CAPSULE | Freq: Two times a day (BID) | ORAL | 1 refills | Status: DC
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
## 2021-12-18 NOTE — Patient Instructions (Addendum)
I am starting you on a medication called Valproic acid to help with your mood and anxiety.   The Valproic acid will need to be taken by mouth twice per day. Please let us know if you have any questions or concerns while taking it.   This will need to be monitored to make sure you are taking the correct dose and it is not causing harm to your body.   Please return to the office for labs in one month  Here are some mental health resources that may be able to assist with therapy services You can also try using an online service such as Talkspace.com, Springhealth.com, betterhelp.com    [facility]  [phone] [email or web address] [facility]  [address] [place], [place] [phone] Crisis [phone]  [facility]) can see uninsured folks for outpatient therapy [email or web address] [address] [place], [place] [address] [phone]  [facility]    Walk-in Mon-Fri, 8am-3pm [email or web address] Scull [address], [place], [place]  [phone]   [address]  [place] [phone] [facility] for psych med management, there may be a wait- if [facility] is working with clients for OPT, they will coordinate with [facility] for psych  [facility]   Walk-in-Clinic: Monday- Friday 9:00 AM - 4:00 PM [address]   [place], [place] [phone]  [facility] [place] ([employer]) walk in M-F 8am-12pm and  1pm-3pm [place]- [address]     [phone]  [employer] [address]  Phone: [phone]  [employer] (Mental Health and substance challenges) [address]   [place] [phone]    [email or web address]    [address]  [employer] [facility]  [facility] -[address], [place]     Phone:  [phone] [facility]-   [address]  [phone]    [facility] ( virtual or zoom therapy) [email or web address]  [address] [place]  [phone]    [facility] [phone]  grief counseling, dementia and caregiver support    [facility] Walk-in MWF 12:30 to 3:00     [address] [place] [address]  [phone]  [email or web address].[email or web address] call to schedule an appointment     [facility] on Mental Illness (NAMI) [place]- Wellness classes, Support groups        [address], [place], [place] [address] [phone]   [email or web address]   [facility]  (Psycho-social Rehabilitation clubhouse, Individual and group therapy) [address] [place], [place] [address]   336- [phone]  24- Hour Availability:  [facility] [phone] or [phone] * [facility] [employer] (Domestic Violence, Rape, etc. )[phone] V[facility]er [phone] or [phone] * [facility] [phone] only) [phone] (after hours) *[facility] [phone] *[place] National Suicide Hotline [phone] Len Childs)

## 2021-12-18 NOTE — Telephone Encounter (Signed)
Routing to provider  
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
## 2021-12-18 NOTE — Telephone Encounter (Signed)
  Chief Complaint: Medication needed Symptoms:  Frequency:  Pertinent Negatives: Patient denies  Disposition: [] ED /[] Urgent Care (no appt availability in office) / [] Appointment(In office/virtual)/ []  [place] [facility]/ [] Home Care/ [] Refused Recommended Disposition /[] [facility]/ [x]  Follow-up with PCP Additional Notes: Pt needs a medication for restless legs as he mentioned at OV today. Please call in a medication. Pt states that he gets restless legs from other medications he is taking.   Summary: Restless leg medication   Patient had a virtual visit today with , PA. He was checking to see if a restless leg medication was called in because he stated that the medication he was prescribed today causes restless leg. Patient said he did mention it at today's appointment. Patient stated his preferred pharmacy is [facility] pharmacy Located at [address],  . The phone # is [phone]      Reason for Disposition  Prescription request for new medicine (not a refill)  Answer Assessment - Initial Assessment Questions 1. DRUG NAME: "What medicine do you need to have refilled?"     Restless leg medication 2. REFILLS REMAINING: "How many refills are remaining?" (Note: The label on the medicine or pill bottle will show how many refills are remaining. If there are no refills remaining, then a renewal may be needed.)     na 3. EXPIRATION DATE: "What is the expiration date?" (Note: The label states when the prescription will expire, and thus can no longer be refilled.)     na 4. PRESCRIBING HCP: "Who prescribed it?" Reason: If prescribed by specialist, call should be referred to that group.     na 5. SYMPTOMS: "Do you have any symptoms?"     na 6. PREGNANCY: "Is there any chance that you are pregnant?" "When was your last menstrual period?"     na  Protocols used: Medication Refill and Renewal Call-A-AH

## 2021-12-22 ENCOUNTER — Ambulatory Visit: Payer: 59 | Admitting: Nurse Practitioner

## 2021-12-23 ENCOUNTER — Ambulatory Visit: Payer: 59 | Admitting: Physical Therapy

## 2021-12-23 ENCOUNTER — Encounter: Payer: 59 | Admitting: Physical Therapy

## 2021-12-23 NOTE — Telephone Encounter (Signed)
Patient is aware of provider advise, okay with holding off medication. Will keep follow up appointment. Patient states his right knee is now hurting and has notified his PT and orthopedist, patient will call if PT does not help knee pain or worsens.

## 2021-12-29 ENCOUNTER — Encounter: Payer: 59 | Admitting: Physical Therapy

## 2022-01-01 ENCOUNTER — Telehealth: Payer: Self-pay
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
## 2022-01-01 NOTE — Telephone Encounter (Signed)
I am not able to provide a refill.  He will need to get it from Ortho.

## 2022-01-04 NOTE — Telephone Encounter (Signed)
Patient was made aware of Karen's recommendations via MyChart. Advised patient to reach back out to our office if any questions or concerns arise.

## 2022-01-06 ENCOUNTER — Encounter: Payer: 59 | Admitting: Physical Therapy

## 2022-01-07 ENCOUNTER — Other Ambulatory Visit: Payer: Self-pay | Admitting: Specialist

## 2022-01-07 DIAGNOSIS — M5126 Other intervertebral disc displacement, lumbar region: Secondary | ICD-10-CM

## 2022-01-07 NOTE — Telephone Encounter (Signed)
Patient called needing Rx refilled Hydrocodone. The number to contact patient is [phone]

## 2022-01-08 ENCOUNTER — Other Ambulatory Visit: Payer: Self-pay | Admitting: Specialist

## 2022-01-08 DIAGNOSIS — M25511 Pain in right shoulder: Secondary | ICD-10-CM

## 2022-01-08 MED ORDER — HYDROCODONE-ACETAMINOPHEN 10-325 MG PO TABS: 0.5000 | ORAL_TABLET | Freq: Four times a day (QID) | ORAL | 0 refills | Status: DC | PRN

## 2022-01-11 ENCOUNTER — Encounter: Payer: 59 | Admitting: Physical Therapy

## 2022-01-12 ENCOUNTER — Ambulatory Visit
Admission: EM | Admit: 2022-01-12 | Discharge: 2022-01-12 | Disposition: A | Discharge status: Home / Self Care | Payer: 59 | Attending: Nurse Practitioner | Admitting: Nurse Practitioner

## 2022-01-12 ENCOUNTER — Other Ambulatory Visit: Payer: Self-pay

## 2022-01-12 DIAGNOSIS — R140 Abdominal distension (gaseous): Secondary | ICD-10-CM

## 2022-01-12 DIAGNOSIS — K219 Gastro-esophageal reflux disease without esophagitis: Secondary | ICD-10-CM

## 2022-01-12 MED ORDER — ALUM & MAG HYDROXIDE-SIMETH 200-200-20 MG/5ML PO SUSP: 30.0000 mL | Freq: Once | ORAL | Status: DC

## 2022-01-12 MED ORDER — SIMETHICONE 80 MG PO CHEW: 80.0000 mg | CHEWABLE_TABLET | Freq: Four times a day (QID) | ORAL | 0 refills | Status: DC | PRN

## 2022-01-12 NOTE — ED Provider Notes (Signed)
[facility]    CSN: [id number] Arrival date & time: 01/12/22  1900      History   Chief Complaint Chief Complaint  Patient presents with   Abdominal Pain    HPI Jose Mitchell is a 41 y.o. male.   The history is provided by the patient.   Patient presents for epigastric pain that started this morning.  Patient describes the pain as bloating.  He states that he has a history of reflux disease.  He denies chest pain, shortness of breath, radiation of pain into the jaw and down the left arm, back pain, nausea, vomiting, or diarrhea.  Patient states that he had barbecue for dinner.  States that he normally takes omeprazole for his reflux disease.  Reports that his medication was recently doubled in the dose, he is currently taking 40 mg daily.  Past Medical History:  Diagnosis Date   Alcohol abuse    Bipolar 1 disorder (HCC)    Cocaine abuse (HCC)    Schizophrenia [facility])     Patient Active Problem List   Diagnosis Date Noted   Anxiety reaction 12/18/2021   Chronic right shoulder pain 11/24/2021   Restless leg 04/08/2020   Bipolar 1 disorder, depressed (HCC) 04/08/2020   Bipolar 2 disorder, major depressive episode (HCC) 04/08/2020   Low back pain radiating down leg 02/29/2020    History reviewed. No pertinent surgical history.     Home Medications    Prior to Admission medications   Medication Sig Start Date End Date Taking? Authorizing Provider  simethicone (GAS-X) 80 MG chewable tablet Chew 1 tablet (80 mg total) by mouth every 6 (six) hours as needed for flatulence. 01/12/22  Yes Florene Brill-Warren, Sadie Haber, NP  cyclobenzaprine (FLEXERIL) 10 MG tablet Take 1 tablet (10 mg total) by mouth 3 (three) times daily as needed for muscle spasms. 12/14/21   Kerrin Champagne, MD  HYDROcodone-acetaminophen (NORCO) 10-325 MG tablet Take 0.5 tablets by mouth every 6 (six) hours as needed for severe pain. 01/08/22   Kerrin Champagne, MD  omeprazole (PRILOSEC) 40 MG capsule Take  1 capsule (40 mg total) by mouth daily. 09/10/21   Larae Grooms, NP  pregabalin (LYRICA) 150 MG capsule Take 1 capsule (150 mg total) by mouth 2 (two) times daily. 10/12/21   Larae Grooms, NP  valproic acid (DEPAKENE) 250 MG capsule Take 1 capsule (250 mg total) by mouth 2 (two) times daily. 12/18/21 02/16/22  Mecum, Oswaldo Conroy, PA-C    Family History Family History  Family history unknown: Yes    Social History Social History   Tobacco Use   Smoking status: Former    Packs/day: 1.00    Types: Cigarettes    Quit date: 2021    Years since quitting: 2.4   Smokeless tobacco: Current    Types: Chew  Vaping Use   Vaping Use: Never used  Substance Use Topics   Alcohol use: Not Currently   Drug use: Not Currently    Types: Cocaine     Allergies   Septra [sulfamethoxazole-trimethoprim]   Review of Systems Review of Systems Per HPI  Physical Exam Triage Vital Signs ED Triage Vitals [01/12/22 1910]  Enc Vitals Group     BP 132/90     Pulse Rate 67     Resp 18     Temp 99.3 F (37.4 C)     Temp Source Oral     SpO2 97 %     Weight  Height      Head Circumference      Peak Flow      Pain Score 5     Pain Loc      Pain Edu?      Excl. in GC?    No data found.  Updated Vital Signs BP 132/90 (BP Location: Right Arm)   Pulse 67   Temp 99.3 F (37.4 C) (Oral)   Resp 18   SpO2 97%   Visual Acuity Right Eye Distance:   Left Eye Distance:   Bilateral Distance:    Right Eye Near:   Left Eye Near:    Bilateral Near:     Physical Exam Vitals and nursing note reviewed.  Constitutional:      General: He is not in acute distress.    Appearance: He is well-developed.  HENT:     Head: Normocephalic.     Mouth/Throat:     Mouth: Mucous membranes are moist.  Eyes:     Extraocular Movements: Extraocular movements intact.     Pupils: Pupils are equal, round, and reactive to light.  Cardiovascular:     Rate and Rhythm: Regular rhythm.     Heart sounds:  Normal heart sounds.  Pulmonary:     Effort: Pulmonary effort is normal.     Breath sounds: Normal breath sounds.  Abdominal:     General: Bowel sounds are normal. There is no distension.     Palpations: Abdomen is soft.     Tenderness: There is no abdominal tenderness.     Comments: Maalox 30 mL was provided to the patient in clinic.  Patient reports some relief.  Skin:    General: Skin is warm and dry.  Neurological:     General: No focal deficit present.     Mental Status: He is alert and oriented to person, place, and time.  Psychiatric:        Mood and Affect: Mood normal.        Behavior: Behavior normal.      UC Treatments / Results  Labs (all labs ordered are listed, but only abnormal results are displayed) Labs Reviewed - No data to display  EKG: NSR   Radiology No results found.  Procedures Procedures (including critical care time)  Medications Ordered in UC Medications  alum & mag hydroxide-simeth (MAALOX/MYLANTA) 200-200-20 MG/5ML suspension 30 mL (has no administration in time range)    Initial Impression / Assessment and Plan / UC Course  I have reviewed the triage vital signs and the nursing notes.  Pertinent labs & imaging results that were available during my care of the patient were reviewed by me and considered in my medical decision making (see chart for details).  Patient presents for complaints of chest pain that started today.  On exam, patient's vital signs are stable, he is in no acute distress.  EKG showed normal sinus rhythm.  EKG does not show any sign of a STEMI at this time.  Patient's symptoms are consistent with bloating that is most likely associated with his reflux disease.  Patient was given a GI cocktail in the clinic in which he reports some relief.  He was advised to continue this current medication.  Patient was advised to follow-up with his primary care physician if the increased dose of omeprazole is not working for him.  Patient  was given strict return precautions of when to go to the emergency department.  Patient advised to follow-up as needed. Final Clinical  Impressions(s) / UC Diagnoses   Final diagnoses:  Gastroesophageal reflux disease, unspecified whether esophagitis present  Bloating     Discharge Instructions      Take medication as prescribed to help with bloating. Continue taking the omeprazole as prescribed. Increase fluids and allow for plenty of rest. Avoid food that trigger your reflux symptoms such as dairy, spicy foods, tomato based foods, etc. Eat smaller meals throughout the day. Eat 2-3 hours before bedtime. Go to the emergency department if you experience chest pain, shortness of breath, difficulty breathing or other concerns.  Follow up with PCP as scheduled.       ED Prescriptions     Medication Sig Dispense Auth. Provider   simethicone (GAS-X) 80 MG chewable tablet Chew 1 tablet (80 mg total) by mouth every 6 (six) hours as needed for flatulence. 30 tablet Izik Bingman-Warren, Sadie Haber, NP      PDMP not reviewed this encounter.   Abran Cantor, NP 01/12/22 2000

## 2022-01-12 NOTE — Discharge Instructions (Addendum)
Take medication as prescribed to help with bloating. Continue taking the omeprazole as prescribed. Increase fluids and allow for plenty of rest. Avoid food that trigger your reflux symptoms such as dairy, spicy foods, tomato based foods, etc. Eat smaller meals throughout the day. Eat 2-3 hours before bedtime. Go to the emergency department if you experience chest pain, shortness of breath, difficulty breathing or other concerns.  Follow up with PCP as scheduled.

## 2022-01-12 NOTE — ED Triage Notes (Signed)
Pt reports epigastric pain and feels bloated since this morning. Denies chest pain, shortness of breath, jaw pain, arm pain, back pain, vision changes.

## 2022-01-13 ENCOUNTER — Telehealth: Payer: Self-pay | Admitting: Specialist

## 2022-01-13 ENCOUNTER — Encounter: Payer: 59 | Admitting: Physical Therapy

## 2022-01-13 NOTE — Telephone Encounter (Signed)
Patient called advised he is having his MRI 01/28/2022 and need an appointment for MRI review with Dr. Otelia Sergeant.   The number to contact patient is [phone].

## 2022-01-13 NOTE — Telephone Encounter (Signed)
Scheduled for 02/18/22 @ 830am, I did put him in the cancellation list to call if someone cancels sooner.

## 2022-01-17 ENCOUNTER — Ambulatory Visit (HOSPITAL_COMMUNITY)
Admission: EM | Admit: 2022-01-17 | Discharge: 2022-01-18 | Disposition: A | Discharge status: Home / Self Care | Payer: 59 | Attending: Urology | Admitting: Urology

## 2022-01-17 DIAGNOSIS — F250 Schizoaffective disorder, bipolar type: Secondary | ICD-10-CM | POA: Diagnosis not present

## 2022-01-17 DIAGNOSIS — F1410 Cocaine abuse, uncomplicated: Secondary | ICD-10-CM

## 2022-01-17 MED ORDER — OLANZAPINE 5 MG PO TABS: 5.0000 mg | ORAL_TABLET | Freq: Every day | ORAL | 1 refills | Status: DC

## 2022-01-17 MED ORDER — OLANZAPINE 10 MG PO TBDP
10.0000 mg | ORAL_TABLET | Freq: Once | ORAL | Status: AC
  Administered 2022-01-17: 10 mg via ORAL
  Filled 2022-01-17: qty 1

## 2022-01-17 MED ORDER — OLANZAPINE 5 MG PO TABS: 5.0000 mg | ORAL_TABLET | Freq: Every day | ORAL | 0 refills | Status: DC

## 2022-01-17 MED ORDER — PANTOPRAZOLE SODIUM 40 MG PO TBEC
40.0000 mg | DELAYED_RELEASE_TABLET | Freq: Every day | ORAL | Status: DC
Start: 2022-01-18 — End: 2022-01-18
  Administered 2022-01-17: 40 mg via ORAL
  Filled 2022-01-17: qty 1

## 2022-01-18 ENCOUNTER — Ambulatory Visit: Payer: 59 | Admitting: Family Medicine

## 2022-01-18 ENCOUNTER — Ambulatory Visit: Payer: 59 | Admitting: Nurse Practitioner

## 2022-01-18 MED ORDER — ACETAMINOPHEN 325 MG PO TABS
650.0000 mg | ORAL_TABLET | Freq: Once | ORAL | Status: AC
Start: 2022-01-18 — End: 2022-01-18
  Administered 2022-01-18: 650 mg via ORAL
  Filled 2022-01-18: qty 2

## 2022-01-18 NOTE — BH Assessment (Addendum)
Clinician contact pt's mother Elease [place], [phone]) to inform her the pt is on his way home. Pt's mother to stay overnight with the pt. Pt's mothers thanked Facilities manager.    Redmond Pulling, MS, [facility], [facility] Triage Specialist [phone]

## 2022-01-21 ENCOUNTER — Ambulatory Visit: Payer: 59 | Admitting: Specialist

## 2022-01-25 ENCOUNTER — Ambulatory Visit: Payer: 59 | Admitting: Nurse Practitioner

## 2022-01-27 ENCOUNTER — Encounter: Payer: Self-pay | Admitting: Family Medicine

## 2022-01-27 ENCOUNTER — Ambulatory Visit (INDEPENDENT_AMBULATORY_CARE_PROVIDER_SITE_OTHER): Payer: 59 | Admitting: Family Medicine

## 2022-01-27 VITALS — BP 124/82 | HR 81 | Ht 74.0 in | Wt 225.8 lb

## 2022-01-27 DIAGNOSIS — R7301 Impaired fasting glucose: Secondary | ICD-10-CM

## 2022-01-27 DIAGNOSIS — E559 Vitamin D deficiency, unspecified: Secondary | ICD-10-CM | POA: Diagnosis not present

## 2022-01-27 DIAGNOSIS — Z0001 Encounter for general adult medical examination with abnormal findings: Secondary | ICD-10-CM

## 2022-01-27 DIAGNOSIS — R1013 Epigastric pain: Secondary | ICD-10-CM

## 2022-01-27 HISTORY — DX: Epigastric pain: R10.13

## 2022-01-27 MED ORDER — PANTOPRAZOLE SODIUM 40 MG PO TBEC: 40.0000 mg | DELAYED_RELEASE_TABLET | Freq: Every day | ORAL | 3 refills | Status: DC
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
## 2022-01-27 NOTE — Assessment & Plan Note (Signed)
-  Symptoms likely due to GERD -Will change omeprazole to pantoprazole  -Advised to avoid certain foods and drinks, such as coffee, chocolate, onions, peppermint, spicy foods, carbonated beverages, citrus fruits, tomatoes, onions, Garlic, alcohol, and Fatty foods (bacon, burgers, sausages, steak, fried foods, dairy food)

## 2022-01-27 NOTE — Patient Instructions (Addendum)
I appreciate the opportunity to provide care to you today!    Follow up:  3 months  Labs: please stop by the lab during the week to get your blood drawn (CBC, CMP, TSH, Lipid profile, HgA1c, Vit D)  - please pick up your medications at the pharmacy   Avoid certain foods and drinks, such as coffee, chocolate, onions, peppermint, spicy foods, carbonated beverages, citrus fruits, tomatoes, onions, Garlic, alcohol, Fatty foods (bacon, burgers, sausages, steak, fried foods, dairy food)    Recommended: High fiber foods, whole grain cereal, oatmeal, brown rice, root vegetables, non- citrus fruits, High protein foods, Health fats (avocados, olive oil, nuts and seeds)    Please continue to a heart-healthy diet and increase your physical activities. Try to exercise for at least three times a week.      It was a pleasure to see you and I look forward to continuing to work together on your health and well-being. Please do not hesitate to call the office if you need care or have questions about your care.   Have a wonderful day and week. With Gratitude, [name] MSN, FNP-BC

## 2022-01-28 ENCOUNTER — Ambulatory Visit (HOSPITAL_COMMUNITY)
Admission: RE | Admit: 2022-01-28 | Discharge: 2022-01-28 | Disposition: A | Discharge status: Home / Self Care | Payer: 59 | Source: Ambulatory Visit | Attending: Specialist | Admitting: Specialist

## 2022-01-28 ENCOUNTER — Encounter (HOSPITAL_COMMUNITY): Payer: Self-pay

## 2022-01-28 DIAGNOSIS — M19011 Primary osteoarthritis, right shoulder: Secondary | ICD-10-CM | POA: Diagnosis not present

## 2022-01-28 DIAGNOSIS — R609 Edema, unspecified: Secondary | ICD-10-CM | POA: Insufficient documentation

## 2022-01-28 DIAGNOSIS — M25511 Pain in right shoulder: Secondary | ICD-10-CM

## 2022-01-28 MED ORDER — IOHEXOL 180 MG/ML  SOLN
15.0000 mL | Freq: Once | INTRAMUSCULAR | Status: AC
  Administered 2022-01-28: 15 mL

## 2022-01-28 MED ORDER — LIDOCAINE HCL (PF) 2 % IJ SOLN
INTRAMUSCULAR | Status: AC
  Administered 2022-01-28: 10 mL via INTRAMUSCULAR
  Filled 2022-01-28: qty 10

## 2022-01-28 MED ORDER — SODIUM CHLORIDE (PF) 0.9 % IJ SOLN
INTRAMUSCULAR | Status: AC
  Administered 2022-01-28: 20 mL
  Filled 2022-01-28: qty 50

## 2022-01-28 MED ORDER — GADOBUTROL 1 MMOL/ML IV SOLN: 0.5000 mL | Freq: Once | INTRAVENOUS | Status: DC | PRN

## 2022-01-28 MED ORDER — LIDOCAINE HCL (PF) 2 % IJ SOLN
10.0000 mL | Freq: Once | INTRAMUSCULAR | Status: AC
  Administered 2022-01-28: 10 mL

## 2022-01-28 MED ORDER — IOHEXOL 180 MG/ML  SOLN: 15.0000 mL | Freq: Once | INTRAMUSCULAR | Status: DC | PRN

## 2022-01-28 MED ORDER — GADOBUTROL 1 MMOL/ML IV SOLN
0.5000 mL | Freq: Once | INTRAVENOUS | Status: AC | PRN
  Administered 2022-01-28: 0.5 mL

## 2022-01-28 MED ORDER — POVIDONE-IODINE 10 % EX SOLN
CUTANEOUS | Status: AC
  Filled 2022-01-28: qty 14.8

## 2022-01-29 LAB — CBC WITH DIFFERENTIAL/PLATELET
Basophils Absolute: 0.1 10*3/uL (ref 0.0–0.2)
Basos: 1 %
EOS (ABSOLUTE): 0.4 10*3/uL (ref 0.0–0.4)
Eos: 4 %
Hematocrit: 46.5 % (ref 37.5–51.0)
Hemoglobin: 15.7 g/dL (ref 13.0–17.7)
Immature Grans (Abs): 0 10*3/uL (ref 0.0–0.1)
Immature Granulocytes: 0 %
Lymphocytes Absolute: 2.7 10*3/uL (ref 0.7–3.1)
Lymphs: 30 %
MCH: 30 pg (ref 26.6–33.0)
MCHC: 33.8 g/dL (ref 31.5–35.7)
MCV: 89 fL (ref 79–97)
Monocytes Absolute: 0.8 10*3/uL (ref 0.1–0.9)
Monocytes: 9 %
Neutrophils Absolute: 5.1 10*3/uL (ref 1.4–7.0)
Neutrophils: 56 %
Platelets: 283 10*3/uL (ref 150–450)
RBC: 5.23 x10E6/uL (ref 4.14–5.80)
RDW: 12.3 % (ref 11.6–15.4)
WBC: 9.1 10*3/uL (ref 3.4–10.8)

## 2022-01-29 LAB — CMP14+EGFR
ALT: 33 IU/L (ref 0–44)
AST: 24 IU/L (ref 0–40)
Albumin/Globulin Ratio: 2.1 (ref 1.2–2.2)
Albumin: 4.6 g/dL (ref 4.0–5.0)
Alkaline Phosphatase: 88 IU/L (ref 44–121)
BUN/Creatinine Ratio: 11 (ref 9–20)
BUN: 12 mg/dL (ref 6–24)
Bilirubin Total: <0.2 mg/dL (ref 0.0–1.2)
CO2: 23 mmol/L (ref 20–29)
Calcium: 9.7 mg/dL (ref 8.7–10.2)
Chloride: 102 mmol/L (ref 96–106)
Creatinine, Ser: 1.06 mg/dL (ref 0.76–1.27)
Globulin, Total: 2.2 g/dL (ref 1.5–4.5)
Glucose: 108 mg/dL — ABNORMAL HIGH (ref 70–99)
Potassium: 4.7 mmol/L (ref 3.5–5.2)
Sodium: 141 mmol/L (ref 134–144)
Total Protein: 6.8 g/dL (ref 6.0–8.5)
eGFR: 90 mL/min/{1.73_m2} (ref >59)

## 2022-01-29 LAB — LIPID PANEL
Chol/HDL Ratio: 4.3 ratio (ref 0.0–5.0)
Cholesterol, Total: 181 mg/dL (ref 100–199)
HDL: 42 mg/dL (ref >39)
LDL Chol Calc (NIH): 110 mg/dL — ABNORMAL HIGH (ref 0–99)
Triglycerides: 167 mg/dL — ABNORMAL HIGH (ref 0–149)
VLDL Cholesterol Cal: 29 mg/dL (ref 5–40)

## 2022-01-29 LAB — VITAMIN D 25 HYDROXY (VIT D DEFICIENCY, FRACTURES): Vit D, 25-Hydroxy: 24.3 ng/mL — ABNORMAL LOW (ref 30.0–100.0)

## 2022-01-29 LAB — TSH+FREE T4
Free T4: 0.77 ng/dL — ABNORMAL LOW (ref 0.82–1.77)
TSH: 0.461 u[IU]/mL (ref 0.450–4.500)

## 2022-01-29 LAB — HEMOGLOBIN A1C
Est. average glucose Bld gHb Est-mCnc: 131 mg/dL
Hgb A1c MFr Bld: 6.2 % — ABNORMAL HIGH (ref 4.8–5.6)

## 2022-01-29 NOTE — Progress Notes (Signed)
Please inform the patient that he has prediabetes. His cholesterol levels are elevated, and I recommend low carbs and fat diet. Please advise him to take OTC vit D 5000iu daily for his low Vit D levels

## 2022-02-03 ENCOUNTER — Other Ambulatory Visit: Payer: Self-pay | Admitting: Specialist

## 2022-02-03 ENCOUNTER — Other Ambulatory Visit: Payer: Self-pay | Admitting: Surgery

## 2022-02-03 DIAGNOSIS — M5126 Other intervertebral disc displacement, lumbar region: Secondary | ICD-10-CM

## 2022-02-03 NOTE — Telephone Encounter (Signed)
Would like refill on hydrocodone.  ?

## 2022-02-04 ENCOUNTER — Other Ambulatory Visit: Payer: Self-pay | Admitting: Surgery

## 2022-02-04 MED ORDER — TRAMADOL HCL 50 MG PO TABS: 50.0000 mg | ORAL_TABLET | Freq: Three times a day (TID) | ORAL | 0 refills | Status: DC | PRN

## 2022-02-05 ENCOUNTER — Ambulatory Visit (INDEPENDENT_AMBULATORY_CARE_PROVIDER_SITE_OTHER): Payer: 59 | Admitting: Family Medicine

## 2022-02-05 ENCOUNTER — Encounter: Payer: Self-pay | Admitting: Family Medicine

## 2022-02-05 DIAGNOSIS — G4709 Other insomnia: Secondary | ICD-10-CM | POA: Diagnosis not present

## 2022-02-05 MED ORDER — TRAZODONE HCL 50 MG PO TABS: 25.0000 mg | ORAL_TABLET | Freq: Every evening | ORAL | 3 refills | Status: DC | PRN

## 2022-02-05 NOTE — Progress Notes (Signed)
Virtual Visit via Telephone Note   This visit type was conducted due to national recommendations for restrictions regarding the COVID-19 Pandemic (e.g. social distancing) in an effort to limit this patient's exposure and mitigate transmission in our community.  Due to his co-morbid illnesses, this patient is at least at moderate risk for complications without adequate follow up.  This format is felt to be most appropriate for this patient at this time.  The patient did not have access to video technology/had technical difficulties with video requiring transitioning to audio format only (telephone).  All issues noted in this document were discussed and addressed.  No physical exam could be performed with this format.  Please refer to the patient's chart for his  consent to telehealth for [facility].   Evaluation Performed:  Follow-up visit  Date:  02/05/2022   ID:  Jose Mitchell, DOB 1980/09/15, MRN [id number]  Patient Location: Home Provider Location: Office/Clinic  Participants: Patient Location of Patient: Home Location of Provider: Telehealth Consent was obtain for visit to be over via telehealth. I verified that I am speaking with the correct person using two identifiers.  PCP:  [name], FNP   Chief Complaint:  insomina  History of Present Illness:    Jose Mitchell is a 41 y.o. male with c/o of insomnia. The onset of symptoms was May 2023. He reports trying melatonin with minimum relief of symptoms.   The patient does not have symptoms concerning for COVID-19 infection (fever, chills, cough, or new shortness of breath).   Past Medical, Surgical, Social History, Allergies, and Medications have been Reviewed.  Past Medical History:  Diagnosis Date   Alcohol abuse    Bipolar 1 disorder (HCC)    Cocaine abuse (HCC)    Schizophrenia (HCC)    History reviewed. No pertinent surgical history.   Current Meds  Medication Sig   cyclobenzaprine (FLEXERIL) 10 MG  tablet Take 1 tablet (10 mg total) by mouth 3 (three) times daily as needed for muscle spasms.   OLANZapine (ZYPREXA) 5 MG tablet Take 1 tablet (5 mg total) by mouth at bedtime.   pantoprazole (PROTONIX) 40 MG tablet Take 1 tablet (40 mg total) by mouth daily.   pregabalin (LYRICA) 150 MG capsule Take 1 capsule (150 mg total) by mouth 2 (two) times daily.   simethicone (GAS-X) 80 MG chewable tablet Chew 1 tablet (80 mg total) by mouth every 6 (six) hours as needed for flatulence.   traMADol (ULTRAM) 50 MG tablet Take 1 tablet (50 mg total) by mouth every 8 (eight) hours as needed.   valproic acid (DEPAKENE) 250 MG capsule Take 1 capsule (250 mg total) by mouth 2 (two) times daily.     Allergies:   Septra [sulfamethoxazole-trimethoprim]   ROS:   Please see the history of present illness.     All other systems reviewed and are negative.   Labs/Other Tests and Data Reviewed:    Recent Labs: 01/28/2022: ALT 33; BUN 12; Creatinine, Ser 1.06; Hemoglobin 15.7; Platelets 283; Potassium 4.7; Sodium 141; TSH 0.461   Recent Lipid Panel Lab Results  Component Value Date/Time   CHOL 181 01/28/2022 08:07 AM   TRIG 167 (H) 01/28/2022 08:07 AM   HDL 42 01/28/2022 08:07 AM   CHOLHDL 4.3 01/28/2022 08:07 AM   LDLCALC 110 (H) 01/28/2022 08:07 AM    Wt Readings from Last 3 Encounters:  01/27/22 225 lb 12.8 oz (102.4 kg)  12/14/21 227 lb (103 kg)  11/24/21 226  lb 6.4 oz (102.7 kg)     Objective:    Vital Signs:  There were no vitals taken for this visit.     ASSESSMENT & PLAN:      Insomnia Sleep hygiene reviewed Will start a trial of trazodone for sleep   Time:   Today, I have spent 8 minutes reviewing the chart, including problem list, medications, and with the patient with telehealth technology discussing the above problems.   Medication Adjustments/Labs and Tests Ordered: Current medicines are reviewed at length with the patient today.  Concerns regarding medicines are outlined  above.   Tests Ordered: No orders of the defined types were placed in this encounter.   Medication Changes: No orders of the defined types were placed in this encounter.    Note: This dictation was prepared with Dragon dictation along with smaller phrase technology. Similar sounding words can be transcribed inadequately or may not be corrected upon review. Any transcriptional errors that result from this process are unintentional.      Disposition:  Follow up  Signed, [name], FNP  02/05/2022 12:32 PM     [facility]

## 2022-02-06 ENCOUNTER — Other Ambulatory Visit: Payer: Self-pay | Admitting: Physician Assistant

## 2022-02-06 DIAGNOSIS — F319 Bipolar disorder, unspecified: Secondary | ICD-10-CM

## 2022-02-06 DIAGNOSIS — F411 Generalized anxiety disorder: Secondary | ICD-10-CM

## 2022-02-08 NOTE — Telephone Encounter (Signed)
Requested medication (s) are due for refill today: yes  Requested medication (s) are on the active medication list: yes  Last refill:  12/18/21  Future visit scheduled: no  Notes to clinic:  Unable to refill per protocol, pt has different PCP listed, routing for approval.     Requested Prescriptions  Pending Prescriptions Disp Refills   valproic acid (DEPAKENE) 250 MG capsule [Pharmacy Med Name: Valproic Acid 250 MG Oral Capsule] 60 capsule 0    Sig: Take 1 capsule by mouth twice daily     Neurology:  Anticonvulsants - Valproates Failed - 02/06/2022  9:42 AM      Failed - Valproic Acid (serum) in normal range and within 360 days    No results found for: "VALPROATE", "VPAT"       Passed - AST in normal range and within 360 days    AST  Date Value Ref Range Status  01/28/2022 24 0 - 40 IU/L Final         Passed - ALT in normal range and within 360 days    ALT  Date Value Ref Range Status  01/28/2022 33 0 - 44 IU/L Final         Passed - HGB in normal range and within 360 days    Hemoglobin  Date Value Ref Range Status  01/28/2022 15.7 13.0 - 17.7 g/dL Final         Passed - PLT in normal range and within 360 days    Platelets  Date Value Ref Range Status  01/28/2022 283 150 - 450 x10E3/uL Final         Passed - WBC in normal range and within 360 days    WBC  Date Value Ref Range Status  01/28/2022 9.1 3.4 - 10.8 x10E3/uL Final  02/22/2018 10.0 4.0 - 10.5 K/uL Final         Passed - HCT in normal range and within 360 days    Hematocrit  Date Value Ref Range Status  01/28/2022 46.5 37.5 - 51.0 % Final         Passed - Completed PHQ-2 or PHQ-9 in the last 360 days      Passed - Patient is not pregnant      Passed - Valid encounter within last 12 months    Recent Outpatient Visits           1 month ago Anxiety reaction   [facility] Mecum, Erin E, PA-C   2 months ago Low back pain radiating down leg   [facility] Gabriel Cirri,  NP   3 months ago Left arm pain   [facility] Gabriel Cirri, NP   3 months ago Annual physical exam   [facility] Larae Grooms, NP   5 months ago Gastroesophageal reflux disease, unspecified whether esophagitis present   [facility] Larae Grooms, NP       Future Appointments             In 1 week Otelia Sergeant, Guy Sandifer, MD [facility] [place]   In 2 months [name], FNP [facility]

## 2022-02-09 ENCOUNTER — Other Ambulatory Visit: Payer: Self-pay | Admitting: Specialist

## 2022-02-09 MED ORDER — CYCLOBENZAPRINE HCL 10 MG PO TABS
10.0000 mg | ORAL_TABLET | Freq: Three times a day (TID) | ORAL | 0 refills | Status: DC | PRN
Start: 2022-02-09 — End: 2022-10-04

## 2022-02-09 NOTE — Telephone Encounter (Signed)
Patient called needing Rx refilled Flexeril. Patient said he now uses [facility] in [place] [address]    Patient asked if the pharmacy information can be updated in the system. The number to contact patient is [phone]

## 2022-02-11 ENCOUNTER — Telehealth: Payer: Self-pay | Admitting: Surgery

## 2022-02-11 ENCOUNTER — Other Ambulatory Visit: Payer: Self-pay | Admitting: Specialist

## 2022-02-11 MED ORDER — TRAMADOL HCL 50 MG PO TABS: 50.0000 mg | ORAL_TABLET | Freq: Three times a day (TID) | ORAL | 0 refills | Status: DC | PRN

## 2022-02-11 NOTE — Telephone Encounter (Signed)
Patient called needing Rx refilled Hydrocodone. Patient said he is schedule to go to the pain clinic March 10, 2022 at 10:00am.    The number to contact patient is [phone]

## 2022-02-11 NOTE — Telephone Encounter (Signed)
Pt called and stated wrong medication was sent in for him. He needs hydrocodone Tramadol does nothing for his pain. Please resend hydrocodone to pharmacy. Pt phone number is [phone].

## 2022-02-12 ENCOUNTER — Other Ambulatory Visit: Payer: Self-pay | Admitting: Specialist

## 2022-02-12 NOTE — Telephone Encounter (Signed)
Pt called for an update about correct medication being sent to pharmacy. He called yesterday and asked for correct medication. Please see last note. Please send correct meds to pharmacy and call pt when sent. Pt phone number is [phone].

## 2022-02-13 ENCOUNTER — Ambulatory Visit (HOSPITAL_BASED_OUTPATIENT_CLINIC_OR_DEPARTMENT_OTHER): Payer: 59 | Admitting: Psychiatry

## 2022-02-13 ENCOUNTER — Encounter (HOSPITAL_COMMUNITY): Payer: Self-pay | Admitting: Psychiatry

## 2022-02-13 DIAGNOSIS — F319 Bipolar disorder, unspecified: Secondary | ICD-10-CM | POA: Diagnosis not present

## 2022-02-13 DIAGNOSIS — F411 Generalized anxiety disorder: Secondary | ICD-10-CM

## 2022-02-13 DIAGNOSIS — F1490 Cocaine use, unspecified, uncomplicated: Secondary | ICD-10-CM

## 2022-02-13 DIAGNOSIS — F3181 Bipolar II disorder: Secondary | ICD-10-CM

## 2022-02-13 MED ORDER — OLANZAPINE 5 MG PO TABS: 5.0000 mg | ORAL_TABLET | Freq: Every day | ORAL | 1 refills | Status: DC

## 2022-02-13 MED ORDER — VALPROIC ACID 250 MG PO CAPS: 250.0000 mg | ORAL_CAPSULE | Freq: Two times a day (BID) | ORAL | 1 refills | Status: DC
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
## 2022-02-15 ENCOUNTER — Other Ambulatory Visit: Payer: Self-pay

## 2022-02-15 ENCOUNTER — Emergency Department (HOSPITAL_COMMUNITY)
Admission: EM | Admit: 2022-02-15 | Discharge: 2022-02-15 | Disposition: A | Discharge status: Home / Self Care | Payer: 59 | Attending: Emergency Medicine | Admitting: Emergency Medicine

## 2022-02-15 ENCOUNTER — Encounter (HOSPITAL_COMMUNITY): Payer: Self-pay | Admitting: Emergency Medicine

## 2022-02-15 ENCOUNTER — Other Ambulatory Visit: Payer: Self-pay | Admitting: Specialist

## 2022-02-15 ENCOUNTER — Telehealth (HOSPITAL_COMMUNITY): Payer: Self-pay | Admitting: Psychiatry

## 2022-02-15 DIAGNOSIS — X58XXXA Exposure to other specified factors, initial encounter: Secondary | ICD-10-CM | POA: Insufficient documentation

## 2022-02-15 DIAGNOSIS — Z5321 Procedure and treatment not carried out due to patient leaving prior to being seen by health care provider: Secondary | ICD-10-CM | POA: Insufficient documentation

## 2022-02-15 DIAGNOSIS — S0990XA Unspecified injury of head, initial encounter: Secondary | ICD-10-CM | POA: Insufficient documentation

## 2022-02-15 MED ORDER — TRAMADOL HCL 50 MG PO TABS: 50.0000 mg | ORAL_TABLET | Freq: Three times a day (TID) | ORAL | 0 refills | Status: DC | PRN
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
## 2022-02-18 ENCOUNTER — Ambulatory Visit (INDEPENDENT_AMBULATORY_CARE_PROVIDER_SITE_OTHER): Payer: 59

## 2022-02-18 ENCOUNTER — Encounter: Payer: Self-pay | Admitting: Specialist

## 2022-02-18 ENCOUNTER — Ambulatory Visit (INDEPENDENT_AMBULATORY_CARE_PROVIDER_SITE_OTHER): Payer: 59 | Admitting: Specialist

## 2022-02-18 VITALS — BP 119/80 | HR 87 | Ht 74.0 in | Wt 225.0 lb

## 2022-02-18 DIAGNOSIS — S53442S Ulnar collateral ligament sprain of left elbow, sequela: Secondary | ICD-10-CM | POA: Diagnosis not present

## 2022-02-18 DIAGNOSIS — M19011 Primary osteoarthritis, right shoulder: Secondary | ICD-10-CM

## 2022-02-18 DIAGNOSIS — M7541 Impingement syndrome of right shoulder: Secondary | ICD-10-CM | POA: Diagnosis not present

## 2022-02-18 DIAGNOSIS — M25522 Pain in left elbow: Secondary | ICD-10-CM | POA: Diagnosis not present

## 2022-02-18 MED ORDER — HYDROCODONE-ACETAMINOPHEN 7.5-325 MG PO TABS: 1.0000 | ORAL_TABLET | Freq: Four times a day (QID) | ORAL | 0 refills | Status: DC | PRN
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
## 2022-02-24 ENCOUNTER — Other Ambulatory Visit: Payer: Self-pay | Admitting: Nurse Practitioner

## 2022-02-24 DIAGNOSIS — K219 Gastro-esophageal reflux disease without esophagitis: Secondary | ICD-10-CM

## 2022-03-03 ENCOUNTER — Telehealth (HOSPITAL_COMMUNITY): Payer: Self-pay | Admitting: Psychiatry

## 2022-03-03 NOTE — Telephone Encounter (Signed)
Patient called to cancel both psychiatrist and therapist appointments. Stated he is terminating services with this clinic.

## 2022-03-09 NOTE — Telephone Encounter (Signed)
Medication refill communication via MyChart.

## 2022-03-10 ENCOUNTER — Ambulatory Visit: Payer: 59 | Admitting: Pain Medicine

## 2022-03-22 ENCOUNTER — Telehealth (HOSPITAL_COMMUNITY): Payer: 59 | Admitting: Psychiatry

## 2022-04-12 ENCOUNTER — Ambulatory Visit (HOSPITAL_COMMUNITY): Payer: 59 | Admitting: Licensed Clinical Social Worker

## 2022-04-19 ENCOUNTER — Encounter: Payer: Self-pay | Admitting: Family Medicine

## 2022-04-19 ENCOUNTER — Other Ambulatory Visit: Payer: Self-pay

## 2022-04-19 ENCOUNTER — Telehealth: Payer: Self-pay | Admitting: Family Medicine

## 2022-04-19 DIAGNOSIS — R1013 Epigastric pain: Secondary | ICD-10-CM

## 2022-04-19 MED ORDER — PANTOPRAZOLE SODIUM 40 MG PO TBEC: 40.0000 mg | DELAYED_RELEASE_TABLET | Freq: Every day | ORAL | 0 refills | Status: DC

## 2022-04-19 NOTE — Telephone Encounter (Signed)
Pt scheduled appt on 04/27/22, medication sent to pharmacy with no refills.

## 2022-04-19 NOTE — Telephone Encounter (Signed)
Pt called stating he is needing a refill,can you please refill?    pantoprazole (PROTONIX) 40 MG tablet    [facility] [place] [place]

## 2022-04-27 ENCOUNTER — Encounter: Payer: Self-pay | Admitting: Family Medicine

## 2022-04-27 ENCOUNTER — Ambulatory Visit (INDEPENDENT_AMBULATORY_CARE_PROVIDER_SITE_OTHER): Payer: Commercial Managed Care - HMO | Admitting: Family Medicine

## 2022-04-27 VITALS — BP 122/83 | HR 63 | Ht 74.0 in | Wt 215.0 lb

## 2022-04-27 DIAGNOSIS — Z2821 Immunization not carried out because of patient refusal: Secondary | ICD-10-CM

## 2022-04-27 DIAGNOSIS — E559 Vitamin D deficiency, unspecified: Secondary | ICD-10-CM

## 2022-04-27 DIAGNOSIS — R7301 Impaired fasting glucose: Secondary | ICD-10-CM | POA: Diagnosis not present

## 2022-04-27 DIAGNOSIS — R1013 Epigastric pain: Secondary | ICD-10-CM

## 2022-04-27 MED ORDER — OMEPRAZOLE 40 MG PO CPDR: 40.0000 mg | DELAYED_RELEASE_CAPSULE | Freq: Every day | ORAL | 3 refills | Status: DC

## 2022-04-27 NOTE — Assessment & Plan Note (Addendum)
Will discontinue Protonix 40 mg and start patient on omeprazole 40 mg We will get an ultrasound of the abdomen to assess for abdominal mass or structural abnormalities Encourage patient to adhere to GERD diet plan We will get labs today and encouraged patient to follow-up in 4 months

## 2022-04-27 NOTE — Progress Notes (Signed)
Established Patient Office Visit  Subjective:  Patient ID: Jose Mitchell, male    DOB: 04-10-1981  Age: 41 y.o. MRN: [id number]  CC:  Chief Complaint  Patient presents with   Follow-up   Back Pain   Abdominal Pain    Pain in back and abdominal pain going on for a yr now.   Nausea    HPI Jose Mitchell is a 41 y.o. male with past medical history of epigastric pain presents for f/u of  chronic medical conditions.  Epigastric pain: He complains of a sharp pain in the epigastric region.  He rates the pain 10 out of 10,  reporting that the pain occurs early in the morning upon waking up.  He also reports that the pain radiates to his right and left flank.  He complains of heartburn and gastric reflux.  He takes Protonix 40 mg with minimal relief of his symptoms.  He reports that he has not completely changed his diet, noting that he still consumes acidic foods and beverages.  Past Medical History:  Diagnosis Date   Alcohol abuse    Bipolar 1 disorder ([place])    Cocaine abuse ([place])    Schizophrenia ([place])     History reviewed. No pertinent surgical history.  Family History  Family history unknown: Yes    Social History   Socioeconomic History   Marital status: Single    Spouse name: Not on file   Number of children: Not on file   Years of education: Not on file   Highest education level: Not on file  Occupational History   Not on file  Tobacco Use   Smoking status: Former    Packs/day: 1.00    Types: Cigarettes    Quit date: 2021    Years since quitting: 2.7   Smokeless tobacco: Current    Types: Chew  Vaping Use   Vaping Use: Never used  Substance and Sexual Activity   Alcohol use: Not Currently   Drug use: Not Currently    Types: Cocaine   Sexual activity: Not Currently  Other Topics Concern   Not on file  Social History Narrative   Not on file   Social Determinants of Health   Financial Resource Strain: Not on file  Food Insecurity: Not on file   Transportation Needs: Not on file  Physical Activity: Not on file  Stress: Not on file  Social Connections: Not on file  Intimate Partner Violence: Not on file    Outpatient Medications Prior to Visit  Medication Sig Dispense Refill   cyclobenzaprine (FLEXERIL) 10 MG tablet Take 1 tablet (10 mg total) by mouth 3 (three) times daily as needed for muscle spasms. (Patient not taking: Reported on 04/27/2022) 30 tablet 0   HYDROcodone-acetaminophen (NORCO) 7.5-325 MG tablet Take 1 tablet by mouth every 6 (six) hours as needed for moderate pain. (Patient not taking: Reported on 04/27/2022) 30 tablet 0   OLANZapine (ZYPREXA) 5 MG tablet Take 1 tablet (5 mg total) by mouth at bedtime. (Patient not taking: Reported on 04/27/2022) 30 tablet 1   pregabalin (LYRICA) 150 MG capsule Take 1 capsule (150 mg total) by mouth 2 (two) times daily. (Patient not taking: Reported on 04/27/2022) 180 capsule 0   simethicone (GAS-X) 80 MG chewable tablet Chew 1 tablet (80 mg total) by mouth every 6 (six) hours as needed for flatulence. (Patient not taking: Reported on 04/27/2022) 30 tablet 0   traMADol (ULTRAM) 50 MG tablet Take 1 tablet (50 mg  total) by mouth every 8 (eight) hours as needed. (Patient not taking: Reported on 04/27/2022) 30 tablet 0   traZODone (DESYREL) 50 MG tablet Take 0.5-1 tablets (25-50 mg total) by mouth at bedtime as needed for sleep. (Patient not taking: Reported on 04/27/2022) 30 tablet 3   valproic acid (DEPAKENE) 250 MG capsule Take 1 capsule (250 mg total) by mouth 2 (two) times daily. 60 capsule 1   pantoprazole (PROTONIX) 40 MG tablet Take 1 tablet (40 mg total) by mouth daily. (Patient not taking: Reported on 04/27/2022) 30 tablet 0   No facility-administered medications prior to visit.    Allergies  Allergen Reactions   Septra [Sulfamethoxazole-Trimethoprim]     Childhood allergy. Does not know reaction    ROS Review of Systems  Constitutional:  Negative for fatigue and fever.   Respiratory:  Negative for chest tightness and shortness of breath.   Gastrointestinal:  Positive for abdominal pain. Negative for nausea, rectal pain and vomiting.  Genitourinary:  Negative for dysuria.  Neurological:  Negative for dizziness and headaches.      Objective:    Physical Exam HENT:     Head: Normocephalic.  Eyes:     Extraocular Movements: Extraocular movements intact.     Pupils: Pupils are equal, round, and reactive to light.  Cardiovascular:     Rate and Rhythm: Normal rate and regular rhythm.  Abdominal:     General: Bowel sounds are normal. There is no distension or abdominal bruit.     Palpations: Abdomen is soft.     Tenderness: There is no abdominal tenderness. There is no right CVA tenderness or left CVA tenderness.  Neurological:     Mental Status: He is alert.     BP 122/83 (BP Location: Right Arm, Patient Position: Sitting)   Pulse 63   Ht 6' 2"  (1.88 m)   Wt 215 lb (97.5 kg)   SpO2 97%   BMI 27.60 kg/m  Wt Readings from Last 3 Encounters:  04/27/22 215 lb (97.5 kg)  02/18/22 225 lb (102.1 kg)  02/15/22 225 lb (102.1 kg)    Lab Results  Component Value Date   TSH 0.461 01/28/2022   Lab Results  Component Value Date   WBC 9.1 01/28/2022   HGB 15.7 01/28/2022   HCT 46.5 01/28/2022   MCV 89 01/28/2022   PLT 283 01/28/2022   Lab Results  Component Value Date   NA 141 01/28/2022   K 4.7 01/28/2022   CO2 23 01/28/2022   GLUCOSE 108 (H) 01/28/2022   BUN 12 01/28/2022   CREATININE 1.06 01/28/2022   BILITOT <0.2 01/28/2022   ALKPHOS 88 01/28/2022   AST 24 01/28/2022   ALT 33 01/28/2022   PROT 6.8 01/28/2022   ALBUMIN 4.6 01/28/2022   CALCIUM 9.7 01/28/2022   ANIONGAP 9 02/22/2018   EGFR 90 01/28/2022   Lab Results  Component Value Date   CHOL 181 01/28/2022   Lab Results  Component Value Date   HDL 42 01/28/2022   Lab Results  Component Value Date   LDLCALC 110 (H) 01/28/2022   Lab Results  Component Value Date    TRIG 167 (H) 01/28/2022   Lab Results  Component Value Date   CHOLHDL 4.3 01/28/2022   Lab Results  Component Value Date   HGBA1C 6.2 (H) 01/28/2022      Assessment & Plan:   Problem List Items Addressed This Visit       Other   Epigastric pain -  Primary    Will discontinue Protonix 40 mg and start patient on omeprazole 40 mg We will get an ultrasound of the abdomen to assess for abdominal mass or structural abnormalities Encourage patient to adhere to GERD diet plan We will get labs today and encouraged patient to follow-up in 4 months      Relevant Orders   US Abdomen Complete   Other Visit Diagnoses     Refused influenza vaccine       Vitamin D deficiency       Relevant Orders   Vitamin D (25 hydroxy)   IFG (impaired fasting glucose)       Relevant Orders   CBC with Differential/Platelet   CMP14+EGFR   TSH + free T4   Hemoglobin A1C   Lipid Profile       Meds ordered this encounter  Medications   omeprazole (PRILOSEC) 40 MG capsule    Sig: Take 1 capsule (40 mg total) by mouth daily.    Dispense:  30 capsule    Refill:  3    Follow-up: Return in about 4 months (around 08/28/2022).    Alvira Monday, FNP

## 2022-04-27 NOTE — Patient Instructions (Addendum)
I appreciate the opportunity to provide care to you today!    Follow up:  4 months  Fasting labs: please stop by the lab during the week to get your blood drawn (CBC, CMP, TSH, Lipid profile, HgA1c, Vit D)    Please pick up your medication at the pharmacy  Please stop by [facility] anytime to get an u/s of your abdomen     Please continue to a heart-healthy diet and increase your physical activities. Try to exercise for 40mins at least three times a week.      It was a pleasure to see you and I look forward to continuing to work together on your health and well-being. Please do not hesitate to call the office if you need care or have questions about your care.   Have a wonderful day and week. With Gratitude, Alvira Monday MSN, FNP-BC

## 2022-04-29 ENCOUNTER — Telehealth: Payer: Self-pay

## 2022-04-29 ENCOUNTER — Other Ambulatory Visit: Payer: Self-pay | Admitting: Family Medicine

## 2022-04-29 DIAGNOSIS — E559 Vitamin D deficiency, unspecified: Secondary | ICD-10-CM

## 2022-04-29 LAB — CMP14+EGFR
ALT: 14 IU/L (ref 0–44)
AST: 13 IU/L (ref 0–40)
Albumin/Globulin Ratio: 2 (ref 1.2–2.2)
Albumin: 4.7 g/dL (ref 4.1–5.1)
Alkaline Phosphatase: 74 IU/L (ref 44–121)
BUN/Creatinine Ratio: 9 (ref 9–20)
BUN: 10 mg/dL (ref 6–24)
Bilirubin Total: 0.3 mg/dL (ref 0.0–1.2)
CO2: 26 mmol/L (ref 20–29)
Calcium: 9.7 mg/dL (ref 8.7–10.2)
Chloride: 101 mmol/L (ref 96–106)
Creatinine, Ser: 1.14 mg/dL (ref 0.76–1.27)
Globulin, Total: 2.3 g/dL (ref 1.5–4.5)
Glucose: 109 mg/dL — ABNORMAL HIGH (ref 70–99)
Potassium: 4.9 mmol/L (ref 3.5–5.2)
Sodium: 140 mmol/L (ref 134–144)
Total Protein: 7 g/dL (ref 6.0–8.5)
eGFR: 82 mL/min/{1.73_m2} (ref >59)

## 2022-04-29 LAB — LIPID PANEL
Chol/HDL Ratio: 4.1 ratio (ref 0.0–5.0)
Cholesterol, Total: 160 mg/dL (ref 100–199)
HDL: 39 mg/dL — ABNORMAL LOW (ref >39)
LDL Chol Calc (NIH): 87 mg/dL (ref 0–99)
Triglycerides: 200 mg/dL — ABNORMAL HIGH (ref 0–149)
VLDL Cholesterol Cal: 34 mg/dL (ref 5–40)

## 2022-04-29 LAB — CBC WITH DIFFERENTIAL/PLATELET
Basophils Absolute: 0.1 10*3/uL (ref 0.0–0.2)
Basos: 1 %
EOS (ABSOLUTE): 0.6 10*3/uL — ABNORMAL HIGH (ref 0.0–0.4)
Eos: 8 %
Hematocrit: 46.6 % (ref 37.5–51.0)
Hemoglobin: 16 g/dL (ref 13.0–17.7)
Immature Grans (Abs): 0 10*3/uL (ref 0.0–0.1)
Immature Granulocytes: 0 %
Lymphocytes Absolute: 2.2 10*3/uL (ref 0.7–3.1)
Lymphs: 30 %
MCH: 30.2 pg (ref 26.6–33.0)
MCHC: 34.3 g/dL (ref 31.5–35.7)
MCV: 88 fL (ref 79–97)
Monocytes Absolute: 0.6 10*3/uL (ref 0.1–0.9)
Monocytes: 8 %
Neutrophils Absolute: 3.9 10*3/uL (ref 1.4–7.0)
Neutrophils: 53 %
Platelets: 275 10*3/uL (ref 150–450)
RBC: 5.29 x10E6/uL (ref 4.14–5.80)
RDW: 11.9 % (ref 11.6–15.4)
WBC: 7.4 10*3/uL (ref 3.4–10.8)

## 2022-04-29 LAB — VITAMIN D 25 HYDROXY (VIT D DEFICIENCY, FRACTURES): Vit D, 25-Hydroxy: 25.5 ng/mL — ABNORMAL LOW (ref 30.0–100.0)

## 2022-04-29 LAB — TSH+FREE T4
Free T4: 1 ng/dL (ref 0.82–1.77)
TSH: 0.411 u[IU]/mL — ABNORMAL LOW (ref 0.450–4.500)

## 2022-04-29 LAB — HEMOGLOBIN A1C
Est. average glucose Bld gHb Est-mCnc: 120 mg/dL
Hgb A1c MFr Bld: 5.8 % — ABNORMAL HIGH (ref 4.8–5.6)

## 2022-04-29 MED ORDER — VITAMIN D (ERGOCALCIFEROL) 1.25 MG (50000 UNIT) PO CAPS: 50000.0000 [IU] | ORAL_CAPSULE | ORAL | 2 refills | Status: DC

## 2022-04-29 NOTE — Telephone Encounter (Signed)
Patient called back and said never mind the fish oil is OTC.

## 2022-04-29 NOTE — Progress Notes (Signed)
The 10-year ASCVD risk score (Arnett DK, et al., 2019) is: 1.2%   Values used to calculate the score:     Age: 41 years     Sex: Male     Is Non-Hispanic African American: No     Diabetic: No     Tobacco smoker: No     Systolic Blood Pressure: 808 mmHg     Is BP treated: No     HDL Cholesterol: 39 mg/dL     Total Cholesterol: 160 mg/dL

## 2022-04-29 NOTE — Telephone Encounter (Signed)
Patient called need fish oil to pharmacy and called regarding lab results.  Please contact patient back at [phone]

## 2022-04-29 NOTE — Progress Notes (Signed)
Please inform the patient that his hemoglobin A1c decreased from 6.2-5.8.  Keep up the good work!  His triglyceride is elevated, I recommend him taking over-the-counter fish oil 2000 mg twice daily to decrease his triglyceride levels.  I also recommend increasing his intake of fresh fruits and vegetables to increase his HDL levels.  His vitamin D is deficient therefore I have sent a prescription of weekly vitamin D supplement to his pharmacy.  I recommend that he starts therapy as soon as possible.  His TSH is slightly low, we will reassess those levels at his next appointment.

## 2022-04-30 ENCOUNTER — Ambulatory Visit: Payer: 59 | Admitting: Family Medicine

## 2022-04-30 NOTE — Telephone Encounter (Signed)
Noted  

## 2022-05-04 ENCOUNTER — Ambulatory Visit (HOSPITAL_COMMUNITY)
Admission: RE | Admit: 2022-05-04 | Discharge: 2022-05-04 | Disposition: A | Discharge status: Home / Self Care | Payer: Commercial Managed Care - HMO | Source: Ambulatory Visit | Attending: Family Medicine | Admitting: Family Medicine

## 2022-05-04 DIAGNOSIS — R1013 Epigastric pain: Secondary | ICD-10-CM | POA: Insufficient documentation

## 2022-05-04 NOTE — Progress Notes (Signed)
Please inform the patient that ultrasound of his abdomen showed no abnormal findings of his organs.  There was no evidence of gallstones, inflammation of the liver, or the pancreas.  He kidneys are within normal limits, and his plain size and appearance are within normal limits.

## 2022-05-14 ENCOUNTER — Telehealth: Payer: Self-pay | Admitting: Family Medicine

## 2022-05-14 NOTE — Telephone Encounter (Signed)
Pt called stating with his insu he has changed pharmacies. Wants to know if you can please transfer all his medication to his new pharmacy?  All meds    [facility] home [facility]

## 2022-05-17 ENCOUNTER — Other Ambulatory Visit: Payer: Self-pay | Admitting: Family Medicine

## 2022-05-17 DIAGNOSIS — K219 Gastro-esophageal reflux disease without esophagitis: Secondary | ICD-10-CM

## 2022-05-17 DIAGNOSIS — G4709 Other insomnia: Secondary | ICD-10-CM

## 2022-05-17 MED ORDER — TRAZODONE HCL 50 MG PO TABS: 25.0000 mg | ORAL_TABLET | Freq: Every evening | ORAL | 1 refills | Status: DC | PRN

## 2022-05-17 MED ORDER — OMEPRAZOLE 40 MG PO CPDR: 40.0000 mg | DELAYED_RELEASE_CAPSULE | Freq: Every day | ORAL | 1 refills | Status: DC

## 2022-05-17 NOTE — Telephone Encounter (Signed)
Spoke to pt he is good on medications, will call when he is running low on meds for refills.

## 2022-05-19 ENCOUNTER — Other Ambulatory Visit: Payer: Self-pay | Admitting: Family Medicine

## 2022-05-25 ENCOUNTER — Other Ambulatory Visit: Payer: Self-pay

## 2022-05-25 ENCOUNTER — Telehealth: Payer: Self-pay

## 2022-05-25 DIAGNOSIS — E559 Vitamin D deficiency, unspecified: Secondary | ICD-10-CM

## 2022-05-25 MED ORDER — VITAMIN D (ERGOCALCIFEROL) 1.25 MG (50000 UNIT) PO CAPS: 50000.0000 [IU] | ORAL_CAPSULE | ORAL | 0 refills | Status: AC

## 2022-05-25 NOTE — Telephone Encounter (Signed)
Refill sent.

## 2022-05-25 NOTE — Telephone Encounter (Signed)
Called from [facility] for a request refill meds for patient and he has requested a 90 supply up to 3 refills.  Vitamin D, Ergocalciferol, (DRISDOL) 1.25 MG (50000 UNIT) CAPS capsule [[id number]]   Pharmacy  [facility], [place]  [address] [place] [address]  Phone:  [phone]  Fax:  [phone]

## 2022-06-21 ENCOUNTER — Ambulatory Visit (INDEPENDENT_AMBULATORY_CARE_PROVIDER_SITE_OTHER): Payer: Commercial Managed Care - HMO | Admitting: Family Medicine

## 2022-06-21 ENCOUNTER — Encounter: Payer: Self-pay | Admitting: Family Medicine

## 2022-06-21 DIAGNOSIS — M5126 Other intervertebral disc displacement, lumbar region: Secondary | ICD-10-CM

## 2022-06-21 MED ORDER — OXYCODONE-ACETAMINOPHEN 10-325 MG PO TABS: 1.0000 | ORAL_TABLET | Freq: Three times a day (TID) | ORAL | 0 refills | Status: AC | PRN

## 2022-06-21 MED ORDER — CYCLOBENZAPRINE HCL 5 MG PO TABS: 5.0000 mg | ORAL_TABLET | Freq: Three times a day (TID) | ORAL | 1 refills | Status: DC | PRN

## 2022-06-21 NOTE — Progress Notes (Signed)
Virtual Visit via Telephone Note   This visit type was conducted via telephone. This format is felt to be most appropriate for this patient at this time.  The patient did not have access to video technology/had technical difficulties with video requiring transitioning to audio format only (telephone).  All issues noted in this document were discussed and addressed.  No physical exam could be performed with this format.  Evaluation Performed:  Follow-up visit  Date:  06/21/2022   ID:  Jose Mitchell, DOB 1980-08-11, MRN [id number]  Patient Location: Home Provider Location: Clinic  Participants: Patient Location of Patient: Home Location of Provider: Clinic Consent was obtain for visit to be over via telehealth. I verified that I am speaking with the correct person using two identifiers.  PCP:  [name], FNP   Chief Complaint: Lumbar pain  History of Present Illness:    Jose Mitchell is a 41 y.o. male with past medical history of herniated disks of the lumbar spine with complaints of lumbar pain.  Onset of symptoms on 06/19/2022.  He reports twisting his back and waking up with lower back pain that he rates 10 out of 10 that radiates down his right lower legs.  He describes the pain as sharp.  Pain is aggravated with bending and walking and is relieved with rest.  He reports the application of lidocaine patches, and notes that he has not taken any medications for pain relief.  He denies urinary and bowel incontinence, lower extremity weakness and recent injury or trauma to the lower back.  He reports that he followed-up with neurosurgery for his herniated disks with plans for surgery, but the patient could not afford the procedure.  He would like to see neurosurgery concerning his chronic lower back pain.    The patient does not have symptoms concerning for COVID-19 infection (fever, chills, cough, or new shortness of breath).   Past Medical, Surgical, Social History,  Allergies, and Medications have been Reviewed.  Past Medical History:  Diagnosis Date   Alcohol abuse    Bipolar 1 disorder ([facility])    Cocaine abuse ([facility])    Schizophrenia ([facility])    History reviewed. No pertinent surgical history.   Current Meds  Medication Sig   cyclobenzaprine (FLEXERIL) 5 MG tablet Take 1 tablet (5 mg total) by mouth 3 (three) times daily as needed for muscle spasms.   omeprazole (PRILOSEC) 40 MG capsule Take 1 capsule (40 mg total) by mouth daily.   oxyCODONE-acetaminophen (PERCOCET) 10-325 MG tablet Take 1 tablet by mouth every 8 (eight) hours as needed for up to 5 days for pain.   Vitamin D, Ergocalciferol, (DRISDOL) 1.25 MG (50000 UNIT) CAPS capsule Take 1 capsule (50,000 Units total) by mouth every 7 (seven) days.     Allergies:   Septra [sulfamethoxazole-trimethoprim]   ROS:   Please see the history of present illness.     All other systems reviewed and are negative.   Labs/Other Tests and Data Reviewed:    Recent Labs: 04/28/2022: ALT 14; BUN 10; Creatinine, Ser 1.14; Hemoglobin 16.0; Platelets 275; Potassium 4.9; Sodium 140; TSH 0.411   Recent Lipid Panel Lab Results  Component Value Date/Time   CHOL 160 04/28/2022 08:12 AM   TRIG 200 (H) 04/28/2022 08:12 AM   HDL 39 (L) 04/28/2022 08:12 AM   CHOLHDL 4.1 04/28/2022 08:12 AM   LDLCALC 87 04/28/2022 08:12 AM    Wt Readings from Last 3 Encounters:  04/27/22 215 lb (97.5 kg)  02/18/22 225 lb (102.1 kg)  02/15/22 225 lb (102.1 kg)     Objective:    Vital Signs:  There were no vitals taken for this visit.     ASSESSMENT & PLAN:   Lumbar Herniated Disc We will provide a short course of narcotics for pain relief Flexeril 5 mg 3 times daily ordered Encourage patient to take Flexeril at bedtime Encourage heat applications to the lower back Referral placed to neurosurgery  Time:   Today, I have spent 15 minutes reviewing the chart, including problem list, medications, and with the patient  with telehealth technology discussing the above problems.   Medication Adjustments/Labs and Tests Ordered: Current medicines are reviewed at length with the patient today.  Concerns regarding medicines are outlined above.   Tests Ordered: Orders Placed This Encounter  Procedures   Ambulatory referral to Neurosurgery    Medication Changes: Meds ordered this encounter  Medications   cyclobenzaprine (FLEXERIL) 5 MG tablet    Sig: Take 1 tablet (5 mg total) by mouth 3 (three) times daily as needed for muscle spasms.    Dispense:  30 tablet    Refill:  1   oxyCODONE-acetaminophen (PERCOCET) 10-325 MG tablet    Sig: Take 1 tablet by mouth every 8 (eight) hours as needed for up to 5 days for pain.    Dispense:  15 tablet    Refill:  0     Note: This dictation was prepared with Dragon dictation along with smaller phrase technology. Similar sounding words can be transcribed inadequately or may not be corrected upon review. Any transcriptional errors that result from this process are unintentional.      Disposition:  Follow up  Signed, [name], FNP  06/21/2022 4:25 PM     [facility]

## 2022-07-01 ENCOUNTER — Ambulatory Visit (INDEPENDENT_AMBULATORY_CARE_PROVIDER_SITE_OTHER): Payer: Commercial Managed Care - HMO | Admitting: Family Medicine

## 2022-07-01 ENCOUNTER — Encounter: Payer: Self-pay | Admitting: Family Medicine

## 2022-07-01 VITALS — BP 133/85 | HR 71 | Ht 74.0 in | Wt 222.1 lb

## 2022-07-01 DIAGNOSIS — M545 Low back pain, unspecified: Secondary | ICD-10-CM | POA: Diagnosis not present

## 2022-07-01 DIAGNOSIS — M79606 Pain in leg, unspecified: Secondary | ICD-10-CM | POA: Diagnosis not present

## 2022-07-01 MED ORDER — KETOROLAC TROMETHAMINE 60 MG/2ML IM SOLN
60.0000 mg | Freq: Once | INTRAMUSCULAR | Status: AC
  Administered 2022-07-01: 60 mg via INTRAMUSCULAR

## 2022-07-01 MED ORDER — METHYLPREDNISOLONE ACETATE 80 MG/ML IJ SUSP
80.0000 mg | Freq: Once | INTRAMUSCULAR | Status: AC
  Administered 2022-07-01: 80 mg via INTRAMUSCULAR

## 2022-07-01 NOTE — Assessment & Plan Note (Signed)
Will treat in the clinic today with Toradol 60 mg and Depo-Medrol 80 mg IM Encouraged patient to follow-up with her neurosurgeon tomorrow on 07/02/2022

## 2022-07-01 NOTE — Patient Instructions (Addendum)
I appreciate the opportunity to provide care to you today!    Follow up: 08/31/22  Please follow-up with the neurosurgeon tomorrow concerning your chronic low back pain  I recommend heat application to the lower back as needed for pain relief  Please avoid activities that aggravate your back pain   Please continue to a heart-healthy diet and increase your physical activities. Try to exercise for at least three times a week.      It was a pleasure to see you and I look forward to continuing to work together on your health and well-being. Please do not hesitate to call the office if you need care or have questions about your care.   Have a wonderful day and week. With Gratitude, [name] MSN, FNP-BC

## 2022-07-01 NOTE — Progress Notes (Signed)
Established Patient Office Visit  Subjective:  Patient ID: Jose Mitchell, male    DOB: 1981-04-21  Age: 41 y.o. MRN: [id number]  CC:  Chief Complaint  Patient presents with   Follow-up    1 week f/u from back pain, pt reports back pain today, states he feels like he hurt his back this morning. Pain is a 8/10    HPI Jose Mitchell is a 41 y.o. male with past medical history of chronic lumbar pain and GERD presents for f/u of  chronic medical conditions.  Lumbar pain: He reports recently aggravating his lumbar spine while reaching for his dog.  Pain is rated 8 out of 10 and radiates down his right lower extremity.  He complains of occasional numbness and tingling in his lower extremities.  The pain is described as sharp and is aggravated with bending and walking, but is relieved with rest.  He reports taking half a tablet of Percocet, but stopped treatment due to unusual dreams.  He will be following up with a neurosurgeon tomorrow on 07/02/2022.  He denies urinary and bladder incontinence, muscle weakness and dysuria.  No fever or chills reported.  No red flag symptoms   Past Medical History:  Diagnosis Date   Alcohol abuse    Bipolar 1 disorder ([place])    Cocaine abuse ([place])    Schizophrenia ([place])     History reviewed. No pertinent surgical history.  Family History  Family history unknown: Yes    Social History   Socioeconomic History   Marital status: Single    Spouse name: Not on file   Number of children: Not on file   Years of education: Not on file   Highest education level: Not on file  Occupational History   Not on file  Tobacco Use   Smoking status: Former    Packs/day: 1.00    Types: Cigarettes    Quit date: 2021    Years since quitting: 2.9   Smokeless tobacco: Current    Types: Chew  Vaping Use   Vaping Use: Never used  Substance and Sexual Activity   Alcohol use: Not Currently   Drug use: Not Currently    Types: Cocaine   Sexual activity: Not  Currently  Other Topics Concern   Not on file  Social History Narrative   Not on file   Social Determinants of Health   Financial Resource Strain: Not on file  Food Insecurity: Not on file  Transportation Needs: Not on file  Physical Activity: Not on file  Stress: Not on file  Social Connections: Not on file  Intimate Partner Violence: Not on file    Outpatient Medications Prior to Visit  Medication Sig Dispense Refill   cyclobenzaprine (FLEXERIL) 5 MG tablet Take 1 tablet (5 mg total) by mouth 3 (three) times daily as needed for muscle spasms. 30 tablet 1   omeprazole (PRILOSEC) 40 MG capsule Take 1 capsule (40 mg total) by mouth daily. 90 capsule 1   Vitamin D, Ergocalciferol, (DRISDOL) 1.25 MG (50000 UNIT) CAPS capsule Take 1 capsule (50,000 Units total) by mouth every 7 (seven) days. 12 capsule 0   cyclobenzaprine (FLEXERIL) 10 MG tablet Take 1 tablet (10 mg total) by mouth 3 (three) times daily as needed for muscle spasms. (Patient not taking: Reported on 04/27/2022) 30 tablet 0   OLANZapine (ZYPREXA) 5 MG tablet Take 1 tablet (5 mg total) by mouth at bedtime. (Patient not taking: Reported on 04/27/2022) 30 tablet 1  pregabalin (LYRICA) 150 MG capsule Take 1 capsule (150 mg total) by mouth 2 (two) times daily. (Patient not taking: Reported on 04/27/2022) 180 capsule 0   simethicone (GAS-X) 80 MG chewable tablet Chew 1 tablet (80 mg total) by mouth every 6 (six) hours as needed for flatulence. (Patient not taking: Reported on 04/27/2022) 30 tablet 0   traZODone (DESYREL) 50 MG tablet Take 0.5-1 tablets (25-50 mg total) by mouth at bedtime as needed for sleep. 90 tablet 1   valproic acid (DEPAKENE) 250 MG capsule Take 1 capsule (250 mg total) by mouth 2 (two) times daily. 60 capsule 1   No facility-administered medications prior to visit.    Allergies  Allergen Reactions   Septra [Sulfamethoxazole-Trimethoprim]     Childhood allergy. Does not know reaction    ROS Review of  Systems  Constitutional:  Negative for fatigue and fever.  Respiratory:  Negative for chest tightness and shortness of breath.   Musculoskeletal:  Positive for back pain.  Neurological:  Negative for dizziness and headaches.      Objective:    Physical Exam HENT:     Head: Normocephalic.  Cardiovascular:     Rate and Rhythm: Normal rate and regular rhythm.     Pulses: Normal pulses.     Heart sounds: Normal heart sounds.  Pulmonary:     Effort: Pulmonary effort is normal.     Breath sounds: Normal breath sounds.  Musculoskeletal:     Comments: Pain with palpation of the right lumbar musculature Right CVA tenderness No overlying skin changes   Neurological:     Mental Status: He is alert.     BP 133/85   Pulse 71   Ht _0  (1.88 m)   Wt 222 lb 1.3 oz (100.7 kg)   SpO2 96%   BMI 28.51 kg/m  Wt Readings from Last 3 Encounters:  07/01/22 222 lb 1.3 oz (100.7 kg)  04/27/22 215 lb (97.5 kg)  02/18/22 225 lb (102.1 kg)    Lab Results  Component Value Date   TSH 0.411 (L) 04/28/2022   Lab Results  Component Value Date   WBC 7.4 04/28/2022   HGB 16.0 04/28/2022   HCT 46.6 04/28/2022   MCV 88 04/28/2022   PLT 275 04/28/2022   Lab Results  Component Value Date   NA 140 04/28/2022   K 4.9 04/28/2022   CO2 26 04/28/2022   GLUCOSE 109 (H) 04/28/2022   BUN 10 04/28/2022   CREATININE 1.14 04/28/2022   BILITOT 0.3 04/28/2022   ALKPHOS 74 04/28/2022   AST 13 04/28/2022   ALT 14 04/28/2022   PROT 7.0 04/28/2022   ALBUMIN 4.7 04/28/2022   CALCIUM 9.7 04/28/2022   ANIONGAP 9 02/22/2018   EGFR 82 04/28/2022   Lab Results  Component Value Date   CHOL 160 04/28/2022   Lab Results  Component Value Date   HDL 39 (L) 04/28/2022   Lab Results  Component Value Date   LDLCALC 87 04/28/2022   Lab Results  Component Value Date   TRIG 200 (H) 04/28/2022   Lab Results  Component Value Date   CHOLHDL 4.1 04/28/2022   Lab Results  Component Value Date    HGBA1C 5.8 (H) 04/28/2022      Assessment & Plan:  Low back pain radiating down leg Assessment & Plan: Will treat in the clinic today with Toradol 60 mg and Depo-Medrol 80 mg IM Encouraged patient to follow-up with her neurosurgeon tomorrow on 07/02/2022  Orders: -     methylPREDNISolone Acetate -     Ketorolac Tromethamine    Follow-up: Return if symptoms worsen or fail to improve.   Alvira Monday, FNP

## 2022-08-30 ENCOUNTER — Telehealth: Payer: Self-pay | Admitting: Family Medicine

## 2022-08-30 ENCOUNTER — Other Ambulatory Visit: Payer: Self-pay

## 2022-08-30 DIAGNOSIS — K219 Gastro-esophageal reflux disease without esophagitis: Secondary | ICD-10-CM

## 2022-08-30 MED ORDER — OMEPRAZOLE 40 MG PO CPDR: 40.0000 mg | DELAYED_RELEASE_CAPSULE | Freq: Every day | ORAL | 1 refills | Status: DC

## 2022-08-30 NOTE — Telephone Encounter (Signed)
Prescription Request  08/30/2022  Is this a "Controlled Substance" medicine? No  LOV: 07/01/2022  What is the name of the medication or equipment?  omeprazole (PRILOSEC) 40 MG capsule    Have you contacted your pharmacy to request a refill? No   Which pharmacy would you like this sent to? pharmacy  [facility] - [place], [place] [address], [place] [address] Phone: [phone]  Fax: [phone] DEA #: --     Patient notified that their request is being sent to the clinical staff for review and that they should receive a response within 2 business days.   Please advise at Mobile [phone] (mobile)

## 2022-08-30 NOTE — Telephone Encounter (Signed)
Refill sent.

## 2022-08-30 NOTE — Telephone Encounter (Signed)
error 

## 2022-08-31 ENCOUNTER — Ambulatory Visit: Payer: Commercial Managed Care - HMO | Admitting: Family Medicine

## 2022-09-03 ENCOUNTER — Telehealth: Payer: Self-pay | Admitting: Family Medicine

## 2022-09-03 ENCOUNTER — Other Ambulatory Visit: Payer: Self-pay

## 2022-09-03 DIAGNOSIS — K219 Gastro-esophageal reflux disease without esophagitis: Secondary | ICD-10-CM

## 2022-09-03 MED ORDER — OMEPRAZOLE 40 MG PO CPDR: 40.0000 mg | DELAYED_RELEASE_CAPSULE | Freq: Every day | ORAL | 1 refills | Status: DC

## 2022-09-03 NOTE — Telephone Encounter (Signed)
Filled

## 2022-09-03 NOTE — Telephone Encounter (Signed)
Prescription Request  09/03/2022  Is this a "Controlled Substance" medicine? Yes  LOV: 07/01/2022  What is the name of the medication or equipment? omeprazole (PRILOSEC) 40 MG capsule   Have you contacted your pharmacy to request a refill? Yes   Which pharmacy would you like this sent to?  [place], [place] [address] Phone: [phone] Fax: [phone]    Patient notified that their request is being sent to the clinical staff for review and that they should receive a response within 2 business days.   Please advise at [place]     Pt states phar texted him & told him that they were unable to refill his medication. Please contact patient if there are any questions.

## 2022-09-07 ENCOUNTER — Telehealth: Payer: Self-pay | Admitting: Family Medicine

## 2022-09-07 NOTE — Telephone Encounter (Signed)
Called pharmacy to ask if PA is needed since I'm not showing anything on cover my meds, pharmacy stating it shows pt has no coverage, asked for pt to call them to give them information needed, tried to call pt to let him know unable to reach him left a message for him to return call.

## 2022-09-07 NOTE — Telephone Encounter (Signed)
Patient called said he has tried from [facility] patient has received 2 text message says can't be refilled possible prior authorization  Patient refills omeprazole (PRILOSEC) 40 MG capsule   Pharmacy  [facility], [place] [address] [place] [address] Phone: [phone]  Fax: [phone]

## 2022-09-13 NOTE — Telephone Encounter (Signed)
Spoke to pt let him know I talked to the pharmacy last week and they are saying his insurance isnt going through they need him to call to sort that out with them, gave him the phone number to call. Will call back if he needs anything else from [place].

## 2022-09-13 NOTE — Telephone Encounter (Signed)
Pt states that med was denied again *  Check previous tele

## 2022-10-01 ENCOUNTER — Ambulatory Visit (INDEPENDENT_AMBULATORY_CARE_PROVIDER_SITE_OTHER): Payer: BLUE CROSS/BLUE SHIELD

## 2022-10-01 ENCOUNTER — Ambulatory Visit
Admission: EM | Admit: 2022-10-01 | Discharge: 2022-10-01 | Disposition: A | Discharge status: Home / Self Care | Payer: Self-pay | Attending: Nurse Practitioner | Admitting: Nurse Practitioner

## 2022-10-01 DIAGNOSIS — K5900 Constipation, unspecified: Secondary | ICD-10-CM

## 2022-10-01 DIAGNOSIS — R1013 Epigastric pain: Secondary | ICD-10-CM

## 2022-10-01 NOTE — Discharge Instructions (Addendum)
Overall your x-ray is negative for any acute abnormalities. No constipation visible. Mild thoracolumbar scoliosis and mild lower thoracic spine degenerative changes.   Recommend a stool softener to help with constipation type symptoms.   We do encourage you to follow up with your primary care provider if your symptoms persist for further evaluation with additional imaging.  Recommendation is to be seen by GI for evaluation of possible hiatal hernia due to the epigastric pain  We do recommend that you use over the counter Tylenol or Ibuprofen as directed (do not exceed daily limits).

## 2022-10-01 NOTE — ED Triage Notes (Signed)
Pt reports he has mid to upper abdominal pain, feels like he is bloated, and low back pain x 6 months. Has been seen for this in the past but has gotten worse. Worsens at night. Has trouble moving his bowels. Took gas X, Prilosec, and ibuprofen but no relief.

## 2022-10-01 NOTE — ED Provider Notes (Signed)
[facility]    CSN: [id number] Arrival date & time: 10/01/22  1220      History   Chief Complaint No chief complaint on file.   HPI Jose Mitchell is a 42 y.o. male.   HPI  He is in today for chronic abdominal and back pain over the past 6 months. He reports a flare of his abdominal pain on yesterday. He is having epigastric pain with nausea and dry heaves. He denies vomiting. Denies fever, chills, headache, dizziness, visual changes,  shortness of breath, dyspnea on exertion, chest pain, diarrhea. He has low back pain with right leg involvement.  He has started a new job.  Past Medical History:  Diagnosis Date   Alcohol abuse    Bipolar 1 disorder ([place])    Cocaine abuse ([place])    Schizophrenia [facility])     Patient Active Problem List   Diagnosis Date Noted   Epigastric pain 01/27/2022   Anxiety reaction 12/18/2021   Chronic right shoulder pain 11/24/2021   Restless leg 04/08/2020   Bipolar 1 disorder, depressed ([place]) 04/08/2020   Bipolar 2 disorder, major depressive episode ([place]) 04/08/2020   Low back pain radiating down leg 02/29/2020    History reviewed. No pertinent surgical history.     Home Medications    Prior to Admission medications   Medication Sig Start Date End Date Taking? Authorizing Provider  cyclobenzaprine (FLEXERIL) 10 MG tablet Take 1 tablet (10 mg total) by mouth 3 (three) times daily as needed for muscle spasms. Patient not taking: Reported on 04/27/2022 02/09/22   Jessy Oto, MD  cyclobenzaprine (FLEXERIL) 5 MG tablet Take 1 tablet (5 mg total) by mouth 3 (three) times daily as needed for muscle spasms. 06/21/22   Alvira Monday, FNP  OLANZapine (ZYPREXA) 5 MG tablet Take 1 tablet (5 mg total) by mouth at bedtime. Patient not taking: Reported on 04/27/2022 02/13/22   Merian Capron, MD  omeprazole (PRILOSEC) 40 MG capsule Take 1 capsule (40 mg total) by mouth daily. 09/03/22   Alvira Monday, FNP  pregabalin (LYRICA) 150 MG capsule  Take 1 capsule (150 mg total) by mouth 2 (two) times daily. Patient not taking: Reported on 04/27/2022 10/12/21   Jon Billings, NP  simethicone (GAS-X) 80 MG chewable tablet Chew 1 tablet (80 mg total) by mouth every 6 (six) hours as needed for flatulence. Patient not taking: Reported on 04/27/2022 01/12/22   Leath-Warren, Alda Lea, NP  traZODone (DESYREL) 50 MG tablet Take 0.5-1 tablets (25-50 mg total) by mouth at bedtime as needed for sleep. 05/17/22   Alvira Monday, FNP  valproic acid (DEPAKENE) 250 MG capsule Take 1 capsule (250 mg total) by mouth 2 (two) times daily. 02/13/22 04/14/22  Merian Capron, MD    Family History Family History  Family history unknown: Yes    Social History Social History   Tobacco Use   Smoking status: Former    Packs/day: 1.00    Types: Cigarettes    Quit date: 2021    Years since quitting: 3.1   Smokeless tobacco: Current    Types: Chew  Vaping Use   Vaping Use: Never used  Substance Use Topics   Alcohol use: Not Currently   Drug use: Not Currently    Types: Cocaine     Allergies   Septra [sulfamethoxazole-trimethoprim]   Review of Systems Review of Systems   Physical Exam Triage Vital Signs ED Triage Vitals  Enc Vitals Group     BP 10/01/22 1226  120/77     Pulse Rate 10/01/22 1226 76     Resp 10/01/22 1226 18     Temp 10/01/22 1226 98 F (36.7 C)     Temp Source 10/01/22 1226 Oral     SpO2 10/01/22 1226 96 %     Weight --      Height --      Head Circumference --      Peak Flow --      Pain Score 10/01/22 1231 5     Pain Loc --      Pain Edu? --      Excl. in [place]? --    No data found.  Updated Vital Signs BP 120/77 (BP Location: Right Arm)   Pulse 76   Temp 98 F (36.7 C) (Oral)   Resp 18   SpO2 96%   Visual Acuity Right Eye Distance:   Left Eye Distance:   Bilateral Distance:    Right Eye Near:   Left Eye Near:    Bilateral Near:     Physical Exam Constitutional:      General: He is not in acute  distress.    Appearance: He is normal weight.  HENT:     Head: Normocephalic and atraumatic.  Cardiovascular:     Rate and Rhythm: Normal rate.     Pulses: Normal pulses.  Pulmonary:     Effort: Pulmonary effort is normal.  Abdominal:     General: There is no distension.     Palpations: There is no mass.     Tenderness: There is no abdominal tenderness.     Comments: Hypoactive bowel sounds   Musculoskeletal:     Cervical back: Normal range of motion.  Skin:    General: Skin is warm and dry.     Capillary Refill: Capillary refill takes less than 2 seconds.  Neurological:     General: No focal deficit present.     Mental Status: He is alert.  Psychiatric:        Mood and Affect: Mood normal.        Behavior: Behavior normal.      UC Treatments / Results  Labs (all labs ordered are listed, but only abnormal results are displayed) Labs Reviewed - No data to display  EKG   Radiology DG Abd 2 Views  Result Date: 10/01/2022 CLINICAL DATA:  Epigastric abdominal pain and constipation with abdominal distention. Low back pain for the past 6 months. EXAM: ABDOMEN - 2 VIEW COMPARISON:  None Available. FINDINGS: Normal bowel-gas pattern without free peritoneal air. Clear lung bases. Mild thoracolumbar scoliosis and mild lower thoracic spine degenerative changes. IMPRESSION: No acute abnormality. Electronically Signed   By: Claudie Revering M.D.   On: 10/01/2022 13:23    Procedures Procedures (including critical care time)  Medications Ordered in UC Medications - No data to display  Initial Impression / Assessment and Plan / UC Course  I have reviewed the triage vital signs and the nursing notes.  Pertinent labs & imaging results that were available during my care of the patient were reviewed by me and considered in my medical decision making (see chart for details).     Pain Final Clinical Impressions(s) / UC Diagnoses   Final diagnoses:  Epigastric pain     Discharge  Instructions      Overall your x-ray is negative for any acute abnormalities. No constipation visible. Mild thoracolumbar scoliosis and mild lower thoracic spine degenerative changes.   Recommend  a stool softener to help with constipation type symptoms.   We do encourage you to follow up with your primary care provider if your symptoms persist for further evaluation with additional imaging.  Recommendation is to be seen by GI for evaluation of possible hiatal hernia due to the epigastric pain  We do recommend that you use over the counter Tylenol or Ibuprofen as directed (do not exceed daily limits).      ED Prescriptions   None    PDMP not reviewed this encounter.   Dionisio David Buckingham, NP 10/01/22 1340

## 2022-10-04 ENCOUNTER — Encounter: Payer: Self-pay | Admitting: Internal Medicine

## 2022-10-04 ENCOUNTER — Telehealth: Payer: Self-pay | Admitting: Family Medicine

## 2022-10-04 ENCOUNTER — Ambulatory Visit (INDEPENDENT_AMBULATORY_CARE_PROVIDER_SITE_OTHER): Payer: BLUE CROSS/BLUE SHIELD | Admitting: Internal Medicine

## 2022-10-04 ENCOUNTER — Other Ambulatory Visit: Payer: Self-pay

## 2022-10-04 VITALS — BP 119/77 | HR 67 | Ht 74.0 in | Wt 215.6 lb

## 2022-10-04 DIAGNOSIS — K219 Gastro-esophageal reflux disease without esophagitis: Secondary | ICD-10-CM

## 2022-10-04 DIAGNOSIS — M25511 Pain in right shoulder: Secondary | ICD-10-CM

## 2022-10-04 DIAGNOSIS — G8929 Other chronic pain: Secondary | ICD-10-CM | POA: Diagnosis not present

## 2022-10-04 DIAGNOSIS — M5126 Other intervertebral disc displacement, lumbar region: Secondary | ICD-10-CM

## 2022-10-04 DIAGNOSIS — F3181 Bipolar II disorder: Secondary | ICD-10-CM

## 2022-10-04 DIAGNOSIS — F319 Bipolar disorder, unspecified: Secondary | ICD-10-CM

## 2022-10-04 HISTORY — DX: Gastro-esophageal reflux disease without esophagitis: K21.9

## 2022-10-04 HISTORY — DX: Other intervertebral disc displacement, lumbar region: M51.26

## 2022-10-04 MED ORDER — PREDNISONE 20 MG PO TABS: 40.0000 mg | ORAL_TABLET | Freq: Every day | ORAL | 0 refills | Status: DC

## 2022-10-04 MED ORDER — OMEPRAZOLE 40 MG PO CPDR: 40.0000 mg | DELAYED_RELEASE_CAPSULE | Freq: Two times a day (BID) | ORAL | 1 refills | Status: DC

## 2022-10-04 MED ORDER — CYCLOBENZAPRINE HCL 10 MG PO TABS: 10.0000 mg | ORAL_TABLET | Freq: Three times a day (TID) | ORAL | 0 refills | Status: DC | PRN

## 2022-10-04 NOTE — Assessment & Plan Note (Signed)
Chronic low back pain, suggest history of HNP, lumbar Needs spine surgery - has appt with Orthopedic surgeon in this week Due to acute pain, started Prednisone 40 mg X 5 days Flexeril 10 mg BID Needs to avoid heavy lifting and frequent bending-work note provided

## 2022-10-04 NOTE — Progress Notes (Signed)
Acute Office Visit  Subjective:    Patient ID: Jose Mitchell, male    DOB: [birth year], 42 y.o.   MRN: [id number]  Chief Complaint  Patient presents with   Abdominal Pain    Has been going on for a while now due to pain getting worse and not being able to sleep, work, or do anything.   Back Problem    Disc issues in back and also sciatic nerve issues. 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  He went to urgent care, and had chest x-ray done (?).  He was told that he could have hiatal hernia.  Of note, he has history of GERD and had run out of omeprazole.  He also reports chronic nausea, but denies any recent vomiting.  Denies any hematemesis, melena or hematochezia.  He also reports chronic low back pain, for which he has seen Dr. Louanne Skye  And [facility]R clinic.  He has had epidural injection without much relief.  He was advised to get spine surgery, but could not get it due to financial constraints.  Reports remote back injury, falling from a helicopter.  He also reports right shoulder pain, which is also chronic.  He has seen orthopedic surgeon and also needs surgery for it.  Past Medical History:  Diagnosis Date   Alcohol abuse    Bipolar 1 disorder ([place])    Cocaine abuse ([place])    Schizophrenia ([place])     History reviewed. No pertinent surgical history.  Family History  Family history unknown: Yes    Social History   Socioeconomic History   Marital status: Single    Spouse name: Not on file   Number of children: Not on file   Years of education: Not on file   Highest education level: Not on file  Occupational History   Not on file  Tobacco Use   Smoking status: Former    Packs/day: 1.00    Types: Cigarettes    Quit date: 2021    Years since quitting: 3.1   Smokeless tobacco: Current    Types: Chew  Vaping Use   Vaping Use: Never used  Substance and  Sexual Activity   Alcohol use: Not Currently   Drug use: Not Currently    Types: Cocaine   Sexual activity: Not Currently  Other Topics Concern   Not on file  Social History Narrative   Not on file   Social Determinants of Health   Financial Resource Strain: Not on file  Food Insecurity: Not on file  Transportation Needs: Not on file  Physical Activity: Not on file  Stress: Not on file  Social Connections: Not on file  Intimate Partner Violence: Not on file    Outpatient Medications Prior to Visit  Medication Sig Dispense Refill   omeprazole (PRILOSEC) 40 MG capsule Take 1 capsule (40 mg total) by mouth daily. 90 capsule 1   cyclobenzaprine (FLEXERIL) 10 MG tablet Take 1 tablet (10 mg total) by mouth 3 (three) times daily as needed for muscle spasms. (Patient not taking: Reported on 04/27/2022) 30 tablet 0   cyclobenzaprine (FLEXERIL) 5 MG tablet Take 1 tablet (5 mg total) by mouth 3 (three) times daily as needed for muscle spasms. 30 tablet 1   OLANZapine (ZYPREXA) 5 MG tablet Take 1 tablet (5 mg total) by mouth  at bedtime. (Patient not taking: Reported on 04/27/2022) 30 tablet 1   pregabalin (LYRICA) 150 MG capsule Take 1 capsule (150 mg total) by mouth 2 (two) times daily. (Patient not taking: Reported on 04/27/2022) 180 capsule 0   simethicone (GAS-X) 80 MG chewable tablet Chew 1 tablet (80 mg total) by mouth every 6 (six) hours as needed for flatulence. (Patient not taking: Reported on 04/27/2022) 30 tablet 0   traZODone (DESYREL) 50 MG tablet Take 0.5-1 tablets (25-50 mg total) by mouth at bedtime as needed for sleep. 90 tablet 1   valproic acid (DEPAKENE) 250 MG capsule Take 1 capsule (250 mg total) by mouth 2 (two) times daily. 60 capsule 1   No facility-administered medications prior to visit.    Allergies  Allergen Reactions   Septra [Sulfamethoxazole-Trimethoprim]     Childhood allergy. Does not know reaction    Review of Systems  Constitutional:  Negative for chills  and fever.  HENT:  Negative for congestion and sore throat.   Eyes:  Negative for pain and discharge.  Respiratory:  Negative for cough and shortness of breath.   Cardiovascular:  Negative for chest pain and palpitations.  Gastrointestinal:  Positive for abdominal pain and nausea. Negative for constipation, diarrhea and vomiting.  Endocrine: Negative for polydipsia and polyuria.  Genitourinary:  Negative for dysuria and hematuria.  Musculoskeletal:  Positive for arthralgias and back pain. Negative for neck pain and neck stiffness.  Skin:  Negative for rash.  Neurological:  Negative for dizziness and weakness.  Psychiatric/Behavioral:  Negative for agitation and behavioral problems.        Objective:    Physical Exam Vitals reviewed.  Constitutional:      General: He is not in acute distress.    Appearance: He is obese. He is not diaphoretic.  HENT:     Nose: Nose normal.     Mouth/Throat:     Mouth: Mucous membranes are moist.     Pharynx: No oropharyngeal exudate.  Eyes:     General: No scleral icterus.    Extraocular Movements: Extraocular movements intact.  Cardiovascular:     Rate and Rhythm: Normal rate and regular rhythm.     Pulses: Normal pulses.     Heart sounds: No murmur heard. Pulmonary:     Breath sounds: Normal breath sounds. No wheezing or rales.  Abdominal:     Palpations: Abdomen is soft.     Tenderness: There is abdominal tenderness (Epigastric).  Musculoskeletal:     Cervical back: Neck supple. No tenderness.     Lumbar back: Tenderness present. Decreased range of motion.     Right lower leg: No edema.     Left lower leg: No edema.  Skin:    General: Skin is warm.     Findings: No rash.  Neurological:     General: No focal deficit present.     Mental Status: He is alert and oriented to person, place, and time.  Psychiatric:        Mood and Affect: Mood normal.        Behavior: Behavior normal.     BP 119/77   Pulse 67   Ht '6\' 2"'$  (1.88 m)    Wt 215 lb 9.6 oz (97.8 kg)   SpO2 96%   BMI 27.68 kg/m  Wt Readings from Last 3 Encounters:  10/04/22 215 lb 9.6 oz (97.8 kg)  07/01/22 222 lb 1.3 oz (100.7 kg)  04/27/22 215 lb (97.5 kg)  Assessment & Plan:   Problem List Items Addressed This Visit       Digestive   Gastroesophageal reflux disease - Primary    Epigastric pain likely due to GERD/gastritis - PUD Referred to GI for possible EGD Started omeprazole 40 mg twice daily Avoid oral NSAIDs Avoid hot and spicy food, caffeinated products       Relevant Medications   omeprazole (PRILOSEC) 40 MG capsule   Other Relevant Orders   Ambulatory referral to Gastroenterology     Musculoskeletal and Integument   HNP (herniated nucleus pulposus), lumbar    Chronic low back pain, suggest history of HNP, lumbar Needs spine surgery - has appt with Orthopedic surgeon in this week Due to acute pain, started Prednisone 40 mg X 5 days Flexeril 10 mg BID Needs to avoid heavy lifting and frequent bending-work note provided      Relevant Medications   predniSONE (DELTASONE) 20 MG tablet   cyclobenzaprine (FLEXERIL) 10 MG tablet     Other   Chronic right shoulder pain    Likely shoulder OA and/or rotator cuff injury Needs surgery - has appt with Orthopedic surgeon in this week      Relevant Medications   predniSONE (DELTASONE) 20 MG tablet   cyclobenzaprine (FLEXERIL) 10 MG tablet     Meds ordered this encounter  Medications   predniSONE (DELTASONE) 20 MG tablet    Sig: Take 2 tablets (40 mg total) by mouth daily with breakfast.    Dispense:  10 tablet    Refill:  0   omeprazole (PRILOSEC) 40 MG capsule    Sig: Take 1 capsule (40 mg total) by mouth 2 (two) times daily.    Dispense:  180 capsule    Refill:  1   cyclobenzaprine (FLEXERIL) 10 MG tablet    Sig: Take 1 tablet (10 mg total) by mouth 3 (three) times daily as needed for muscle spasms.    Dispense:  30 tablet    Refill:  0     Jose Mitchell Keith Rake,  MD

## 2022-10-04 NOTE — Assessment & Plan Note (Signed)
Likely shoulder OA and/or rotator cuff injury Needs surgery - has appt with Orthopedic surgeon in this week

## 2022-10-04 NOTE — Patient Instructions (Signed)
Please start taking Omeprazole twice daily.  Please take Flexeril as needed for back muscle spasms.  Please avoid heavy lifting and frequent bending.

## 2022-10-04 NOTE — Telephone Encounter (Signed)
Pt called back in requesting referral to [facility].  Wants a call back.

## 2022-10-04 NOTE — Assessment & Plan Note (Signed)
Epigastric pain likely due to GERD/gastritis - PUD Referred to GI for possible EGD Started omeprazole 40 mg twice daily Avoid oral NSAIDs Avoid hot and spicy food, caffeinated products

## 2022-10-04 NOTE — Telephone Encounter (Signed)
Pt informed

## 2022-10-04 NOTE — Telephone Encounter (Signed)
You can place a referral in

## 2022-10-06 ENCOUNTER — Encounter: Payer: Self-pay | Admitting: Internal Medicine

## 2022-10-07 ENCOUNTER — Ambulatory Visit (INDEPENDENT_AMBULATORY_CARE_PROVIDER_SITE_OTHER): Payer: BLUE CROSS/BLUE SHIELD | Admitting: Sports Medicine

## 2022-10-07 DIAGNOSIS — M5126 Other intervertebral disc displacement, lumbar region: Secondary | ICD-10-CM

## 2022-10-07 DIAGNOSIS — M19019 Primary osteoarthritis, unspecified shoulder: Secondary | ICD-10-CM | POA: Diagnosis not present

## 2022-10-07 DIAGNOSIS — M5441 Lumbago with sciatica, right side: Secondary | ICD-10-CM

## 2022-10-07 DIAGNOSIS — G8929 Other chronic pain: Secondary | ICD-10-CM

## 2022-10-07 DIAGNOSIS — M25511 Pain in right shoulder: Secondary | ICD-10-CM | POA: Diagnosis not present

## 2022-10-07 DIAGNOSIS — M25311 Other instability, right shoulder: Secondary | ICD-10-CM

## 2022-10-07 NOTE — Progress Notes (Signed)
Ongoing low back and right shoulder pain  No surgery; just injection  No relief for either  Limited motion in shoulder due to pain Felt a pop this morning  Tylenol/ibuprofen for pain  Does state he has radicular pain in right arm Also bilateral leg tingling

## 2022-10-07 NOTE — Progress Notes (Signed)
Jose Mitchell - 42 y.o. male MRN [id number]  Date of birth: Apr 08, 1981  Office Visit Note: Visit Date: 10/07/2022 PCP: Alvira Monday, FNP Referred by: Alvira Monday, FNP  Subjective: Chief Complaint  Patient presents with   Lower Back - Pain   Right Shoulder - Pain   HPI: Jose Mitchell is a pleasant 42 y.o. male who presents today for chronic low back pain and right shoulder pain.  Low back pain -this has been chronic in nature. Per Dr. Otho Ket previous notes (08/31/21), did have a herniated nucleus pulposus of lumbar spine. Review of MRI report shows L5-S1 a large right subarticular disc protrusion results in severe right subarticular recess and suspected impingement of the descending right S1 nerve roots.  Has been seen by former partner, Dr. Louanne Skye, and was planning on vasectomy, however this did not go through because of insurance and cost.  His pain continues over the low back as well as radiating down the posterior leg.  This interferes with him working and doing activities of daily living.  Has taken Flexeril for this and will take this when there is some pain at night.  Right shoulder -he has had right shoulder pain for a year or more as well.  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  Pain is worse with overhead activity.  He denies any numbness or tingling going down the arm.  Pain is localized over the [facility] joint as well as over the posterior shoulder.  Does feel some instability of the shoulder and or feel like it may give way with certain lifting motions.  Pertinent ROS were reviewed with the patient and found to be negative unless otherwise specified above in HPI.   Assessment & Plan: Visit Diagnoses:  1. HNP (herniated nucleus pulposus), lumbar   2. Chronic right shoulder pain   3. Chronic midline low back pain with right-sided sciatica   4. AC joint arthropathy   5. Shoulder instability, right    Plan:  Discussed with Kemon the ongoing nature of his chronic low back pain with sciatica which shows a herniated nucleus pulposus with nerve impingement on the descending S1 nerve root.  He has failed conservative measures including epidural spinal injection.  He is interested in surgery, did send a referral to my partner, Dr. Ileene Rubens for evaluation and possible surgical treatment.  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  He also has rather significant marrow edema and arthropathy of the right AC joint.  I did offer him an ultrasound-guided [facility] joint injection today, however he is having some issues whether our practice is covered under the patient's parents today, so we will hold off on this for now.  Will send a referral to Dr. Marlou Sa for evaluation of his type III anterior capsular insertion and shoulder instability - discuss surgical options for him.  He can continue his Flexeril 10 mg nightly for any associated spasming, may take this at nighttime for the low back pain.  If he does have coverage from his insurance, he may present to my clinic at any point for ultrasound-guided [facility] joint injection.  Follow-up: Return for Make appt with Dr. Laurance Flatten (for back) and Dr. Marlou Sa for Right shoulder.   Meds & Orders: No orders of the defined types were placed in this encounter.   Orders Placed This Encounter  Procedures  Ambulatory referral to Orthopedic Surgery   Ambulatory referral to Orthopedic Surgery     Procedures: No procedures performed      Clinical History: No specialty comments available.  He reports that he quit smoking about 3 years ago. His smoking use included cigarettes. He smoked an average of 1 pack per day. His smokeless tobacco use includes chew.  Recent Labs    10/20/21 0838 01/28/22 0807 04/28/22 0812  HGBA1C 6.1* 6.2* 5.8*    Objective:    Physical Exam  Gen: Well-appearing, in no acute  distress; non-toxic CV:  Well-perfused. Warm.  Resp: Breathing unlabored on room air; no wheezing. Psych: Fluid speech in conversation; appropriate affect; normal thought process Neuro: Sensation intact throughout. No gross coordination deficits.   Ortho Exam - Right shoulder: There is positive TTP over the right AC joint.  There is full active range of motion of the right shoulder both active and passively.  There is some pain with endrange external rotation.  Associated pain with labral clunk test, positive pain with O'Brien's testing.  There is 5/5 strength testing of the rotator cuff. NVI.  - Lumbar spine: There is generalized pain surrounding the L5-S1 region of the back, there is a mild reciprocal hypertonicity of the right-sided lumbar paraspinal musculature.  There is pain with axial loading.  Positive straight leg raise on the right.  2/4 bilateral patellar DTRs, 1/4 Achilles DTR.  Imaging:  *Independent review and interpretation of the right shoulder MRI from 01/28/2022 was performed by myself.  There is significant subcortical marrow edema at the distal clavicle and acromion and AC joint, at least moderate to severe AC joint arthropathy.  There is a downsloping acromion.  There is a type III anterior capsular attachment with some fraying of the labrum.  There is proximal biceps tendinopathy.   Narrative & Impression  CLINICAL DATA:  Right shoulder pain and clicking.   EXAM: MRI OF THE RIGHT SHOULDER WITH INTRA-ARTICULAR CONTRAST   TECHNIQUE: Multiplanar, multisequence MR imaging of the right shoulder was performed following the administration of intra-articular contrast.   CONTRAST:  See Injection Documentation.   COMPARISON:  Radiographs 12/14/2021   FINDINGS: The patient was unable to assume the ABER position.   Incidental note is made of low T2 signal in the injected contrast in the joint, typically caused by high concentration of Gadavist. This is not felt to  substantially interfere with exam sensitivity/specificity.   Rotator cuff: Unremarkable   Muscles: Unremarkable. Incidental local anesthetic noted in the anterior deltoid.   Biceps long head: Mild tendinopathy of the intra-articular segment for example on image 18 series 6.   Acromioclavicular Joint: Substantial subcortical marrow edema with mild cortical irregularity at the articulation, compatible with at least moderate AC joint arthropathy. There is little in the way of spurring. Subacromial morphology is type 2 (curved). No significant regional bursitis.   Glenohumeral Joint: Mild degenerative chondral thinning. No substantial glenohumeral spurring. No free fragment observed. MGL intact. The anterior capsular attachment is about 1.3 cm from the anterior labrum, compatible with a type 3 capsular attachment.   Labrum: No abnormal contrast extension into the labrum is identified to indicate the presence of a labral tear. Stress imaging of the anterior inferior labrum could not be performed as the patient was not able to adopt the ABER position.   Bones: No significant extra-articular osseous abnormalities identified.   IMPRESSION: 1. AC joint arthropathy with moderate articular cortical irregularity especially along the distal clavicle, as well as substantial  subcortical marrow edema. There is little in the way of degenerative spurring. Correlate with palpation or tenderness over the [facility] joint. 2. Mild tendinopathy of the long head of the biceps. 3. Mild degenerative chondral thinning in the glenohumeral joint. 4. Type 3 anterior capsular insertion, which can predispose to instability. 5. No labral tear is identified.     Electronically Signed   By: Van Clines M.D.   On: 01/28/2022 16:15   *Lumbar X-ray 12/14/21: AP and lateral lumbar spine with flexion and extension views show minimal  disc narrowing of the L4-5 and L5-S1 discs. Right scoliosis L1 to S1 10   degrees   CLINICAL DATA: Lumbar radiculopathy. Low back pain.   EXAM: MRI LUMBAR SPINE WITHOUT CONTRAST   TECHNIQUE: Multiplanar, multisequence MR imaging of the lumbar spine was performed. No intravenous contrast was administered.   COMPARISON: Lumbar radiographs September 29, 2020.   FINDINGS: Segmentation: Standard.   Alignment: No substantial sagittal subluxation. Broad dextrocurvature.   Vertebrae: Vertebral body heights are maintained. No specific evidence of acute fracture, discitis/osteomyelitis, or suspicious bone lesion.   Conus medullaris and cauda equina: Conus extends to the L1-L2 level. Conus appears normal.   Paraspinal and other soft tissues: Unremarkable   Disc levels:   T12-L1: No significant disc protrusion, foraminal stenosis, or canal stenosis.   L1-L2: No significant disc protrusion, foraminal stenosis, or canal stenosis.   L2-L3: No significant disc protrusion, foraminal stenosis, or canal stenosis.   L3-L4: Small disc bulge and mild bilateral facet hypertrophy with ligamentum flavum thickening. Prominent dorsal epidural fat. Mild bilateral subarticular recess stenosis without significant central canal or foraminal stenosis.   L4-L5: Broad disc bulge with mild bilateral facet hypertrophy and ligamentum flavum thickening. Prominent dorsal epidural fat. Resulting mild canal and bilateral subarticular recess stenosis. Mild left foraminal stenosis. No significant right foraminal stenosis.   L5-S1: Large right subarticular disc protrusion with resulting severe right subarticular recess stenosis and suspected impingement of the descending right S1 nerve roots (series 5 and 6, image 40). No significant central canal stenosis or foraminal stenosis.   IMPRESSION: 1. At L5-S1 a large right subarticular disc protrusion results in severe right subarticular recess and suspected impingement of the descending right S1 nerve roots. 2. Prominent dorsal  epidural fat and degenerative change results in mild canal stenosis at L4-L5. Also, mild left foraminal stenosis at this level.  Past Medical/Family/Surgical/Social History: Medications & Allergies reviewed per EMR, new medications updated. Patient Active Problem List   Diagnosis Date Noted   Gastroesophageal reflux disease 10/04/2022   HNP (herniated nucleus pulposus), lumbar 10/04/2022   Epigastric pain 01/27/2022   Anxiety reaction 12/18/2021   Chronic right shoulder pain 11/24/2021   Restless leg 04/08/2020   Bipolar 1 disorder, depressed ([place]) 04/08/2020   Bipolar 2 disorder, major depressive episode ([place]) 04/08/2020   Low back pain radiating down leg 02/29/2020   Past Medical History:  Diagnosis Date   Alcohol abuse    Bipolar 1 disorder ([place])    Cocaine abuse ([place])    Schizophrenia ([place])    Family History  Family history unknown: Yes   History reviewed. No pertinent surgical history. Social History   Occupational History   Not on file  Tobacco Use   Smoking status: Former    Packs/day: 1    Types: Cigarettes    Quit date: 2021    Years since quitting: 3.2   Smokeless tobacco: Current    Types: Chew  Vaping Use   Vaping Use:  Never used  Substance and Sexual Activity   Alcohol use: Not Currently   Drug use: Not Currently    Types: Cocaine   Sexual activity: Not Currently   Addendum (10/08/22): The patient called back to our office and unfortunately his insurance does not cover Ortho care measures.  He has found a back surgeon who is in his network.  I did send him a message for other shoulder orthopedic specialist to address his concerns.

## 2022-10-08 ENCOUNTER — Encounter: Payer: Self-pay | Admitting: Sports Medicine

## 2022-10-08 ENCOUNTER — Ambulatory Visit
Admission: EM | Admit: 2022-10-08 | Discharge: 2022-10-08 | Disposition: A | Discharge status: Home / Self Care | Payer: BLUE CROSS/BLUE SHIELD | Attending: Family Medicine | Admitting: Family Medicine

## 2022-10-08 DIAGNOSIS — S39012A Strain of muscle, fascia and tendon of lower back, initial encounter: Secondary | ICD-10-CM | POA: Diagnosis not present

## 2022-10-08 MED ORDER — KETOROLAC TROMETHAMINE 30 MG/ML IJ SOLN
30.0000 mg | Freq: Once | INTRAMUSCULAR | Status: AC
  Administered 2022-10-08: 30 mg via INTRAMUSCULAR

## 2022-10-08 NOTE — ED Triage Notes (Signed)
Pt reports left sided middle back pain since this morning. Pain is worse when moving.

## 2022-10-08 NOTE — ED Provider Notes (Signed)
[facility]    CSN: [id number] Arrival date & time: 10/08/22  1045      History   Chief Complaint Chief Complaint  Patient presents with   Back Pain    HPI Jose Mitchell is a 42 y.o. male.   Patient presenting today with new onset left lateral back pain that started this morning.  States the pain is sharp, pulling in nature and worse with movement.  He does have a history of multiple herniated disks awaiting specialist consult on Monday but states this is different and new since this morning.  No known injury but does do a lot of heavy lifting both at work and at home.  Was placed on a course of prednisone and Flexeril 4 days ago by PCP for his herniated disks and states these are not helping much with his ongoing back pain or his new back pain.  Has been applying heat this morning which she states does provide very short-term relief of his pain.  Denies worsening of his radiation of pain down legs, bowel or bladder incontinence, saddle anesthesias, fevers, chills, urinary symptoms.    Past Medical History:  Diagnosis Date   Alcohol abuse    Bipolar 1 disorder ([place])    Cocaine abuse ([place])    Schizophrenia ([place])     Patient Active Problem List   Diagnosis Date Noted   Gastroesophageal reflux disease 10/04/2022   HNP (herniated nucleus pulposus), lumbar 10/04/2022   Epigastric pain 01/27/2022   Anxiety reaction 12/18/2021   Chronic right shoulder pain 11/24/2021   Restless leg 04/08/2020   Bipolar 1 disorder, depressed ([place]) 04/08/2020   Bipolar 2 disorder, major depressive episode ([place]) 04/08/2020   Low back pain radiating down leg 02/29/2020    History reviewed. No pertinent surgical history.     Home Medications    Prior to Admission medications   Medication Sig Start Date End Date Taking? Authorizing Provider  cyclobenzaprine (FLEXERIL) 10 MG tablet Take 1 tablet (10 mg total) by mouth 3 (three) times daily as needed for muscle spasms. 10/04/22    Lindell Spar, MD  omeprazole (PRILOSEC) 40 MG capsule Take 1 capsule (40 mg total) by mouth 2 (two) times daily. 10/04/22   Lindell Spar, MD  predniSONE (DELTASONE) 20 MG tablet Take 2 tablets (40 mg total) by mouth daily with breakfast. 10/04/22   Lindell Spar, MD    Family History Family History  Family history unknown: Yes    Social History Social History   Tobacco Use   Smoking status: Former    Packs/day: 1    Types: Cigarettes    Quit date: 2021    Years since quitting: 3.2   Smokeless tobacco: Current    Types: Chew  Vaping Use   Vaping Use: Never used  Substance Use Topics   Alcohol use: Not Currently   Drug use: Not Currently    Types: Cocaine     Allergies   Septra [sulfamethoxazole-trimethoprim]   Review of Systems Review of Systems PER HPI  Physical Exam Triage Vital Signs ED Triage Vitals  Enc Vitals Group     BP 10/08/22 1321 126/86     Pulse Rate 10/08/22 1321 64     Resp 10/08/22 1321 18     Temp 10/08/22 1321 98.1 F (36.7 C)     Temp Source 10/08/22 1321 Oral     SpO2 10/08/22 1321 98 %     Weight --  Height --      Head Circumference --      Peak Flow --      Pain Score 10/08/22 1324 9     Pain Loc --      Pain Edu? --      Excl. in [place]? --    No data found.  Updated Vital Signs BP 126/86 (BP Location: Right Arm)   Pulse 64   Temp 98.1 F (36.7 C) (Oral)   Resp 18   SpO2 98%   Visual Acuity Right Eye Distance:   Left Eye Distance:   Bilateral Distance:    Right Eye Near:   Left Eye Near:    Bilateral Near:     Physical Exam Vitals and nursing note reviewed.  Constitutional:      Appearance: Normal appearance.  HENT:     Head: Atraumatic.  Eyes:     Extraocular Movements: Extraocular movements intact.     Conjunctiva/sclera: Conjunctivae normal.  Cardiovascular:     Rate and Rhythm: Normal rate and regular rhythm.  Pulmonary:     Effort: Pulmonary effort is normal.     Breath sounds: Normal breath  sounds.  Musculoskeletal:        General: Tenderness present. No swelling or deformity. Normal range of motion.     Cervical back: Normal range of motion and neck supple.     Comments: No midline spinal tenderness to palpation diffusely.  Negative straight leg raise bilateral lower extremities.  Left lateral mid and lumbar tenderness to palpation.  Normal gait and range of motion  Skin:    General: Skin is warm and dry.  Neurological:     General: No focal deficit present.     Mental Status: He is oriented to person, place, and time.     Comments: Bilateral lower extremities neurovascularly intact  Psychiatric:        Mood and Affect: Mood normal.        Thought Content: Thought content normal.        Judgment: Judgment normal.      UC Treatments / Results  Labs (all labs ordered are listed, but only abnormal results are displayed) Labs Reviewed - No data to display  EKG   Radiology No results found.  Procedures Procedures (including critical care time)  Medications Ordered in UC Medications  ketorolac (TORADOL) 30 MG/ML injection 30 mg (30 mg Intramuscular Given 10/08/22 1349)    Initial Impression / Assessment and Plan / UC Course  I have reviewed the triage vital signs and the nursing notes.  Pertinent labs & imaging results that were available during my care of the patient were reviewed by me and considered in my medical decision making (see chart for details).     Already on prednisone and Flexeril, discussed to complete these courses, heat, massage, rest, stretches and will add IM Toradol additionally as he is still having minimal relief.  Follow-up as scheduled Monday with specialist.  Work note given.  Return for worsening symptoms.  No red flag findings today.  Final Clinical Impressions(s) / UC Diagnoses   Final diagnoses:  Strain of lumbar region, initial encounter   Discharge Instructions   None    ED Prescriptions   None    PDMP not reviewed  this encounter.   Volney American, [place] 10/08/22 1358

## 2022-10-11 DIAGNOSIS — M89511 Osteolysis, right shoulder: Secondary | ICD-10-CM | POA: Diagnosis not present

## 2022-10-11 DIAGNOSIS — M25511 Pain in right shoulder: Secondary | ICD-10-CM | POA: Diagnosis not present

## 2022-10-15 ENCOUNTER — Ambulatory Visit (INDEPENDENT_AMBULATORY_CARE_PROVIDER_SITE_OTHER): Payer: BLUE CROSS/BLUE SHIELD | Admitting: Family Medicine

## 2022-10-15 ENCOUNTER — Encounter: Payer: Self-pay | Admitting: Family Medicine

## 2022-10-15 VITALS — BP 112/69 | HR 63 | Ht 74.0 in | Wt 205.0 lb

## 2022-10-15 DIAGNOSIS — K219 Gastro-esophageal reflux disease without esophagitis: Secondary | ICD-10-CM | POA: Diagnosis not present

## 2022-10-15 DIAGNOSIS — R12 Heartburn: Secondary | ICD-10-CM | POA: Diagnosis not present

## 2022-10-15 MED ORDER — SUCRALFATE 1 G PO TABS: 1.0000 g | ORAL_TABLET | Freq: Two times a day (BID) | ORAL | 0 refills | Status: DC

## 2022-10-15 NOTE — Assessment & Plan Note (Addendum)
Explained to patient lifestyle modifications, weight loss, smoking cessation, loose clothing, and avoiding caffeine, fatty and trigger foods.  Patient has an appointment with GI on 3/28 encouraged to follow up  Started on Carafate 1g 2 times daily before meal Continue omeprazole 40 mg twice daily

## 2022-10-15 NOTE — Progress Notes (Signed)
New Patient Office Visit   Subjective   Patient ID: Jose Mitchell, male    DOB: Nov 11, 1980  Age: 42 y.o. MRN: [id number]  CC:  Chief Complaint  Patient presents with   Abdominal Pain    Patient complains of epigastric pain, states at urgent care they said he had a hiatal hernia on 3/8.     HPI Alessio Mcquillin 42 year old male presents to the clinic for epigastric pain. He  has a past medical history of Alcohol abuse, Bipolar 1 disorder ([place]), Cocaine abuse ([place]), and Schizophrenia ([place]).  Abdominal Pain The current episode started more than 1 month ago. Patient report epigastric pain occurs intermittently and has been gradually worsening. The pain is located in the epigastric region only and does not radiate.The pain is at a severity of 10/10. The quality of the pain is burning, sharp and aching. Associated symptoms include constipation. Pertinent negatives include no hematochezia, hematuria, melena or vomiting. The pain is aggravated by movement, eating and certain positions. Relieved by: slowing down on daily activites. Treatments tried: omeprazole 40 mg. The treatment provided mild relief.     Outpatient Encounter Medications as of 10/15/2022  Medication Sig   cyclobenzaprine (FLEXERIL) 10 MG tablet Take 1 tablet (10 mg total) by mouth 3 (three) times daily as needed for muscle spasms.   omeprazole (PRILOSEC) 40 MG capsule Take 1 capsule (40 mg total) by mouth 2 (two) times daily.   predniSONE (DELTASONE) 20 MG tablet Take 2 tablets (40 mg total) by mouth daily with breakfast.   [DISCONTINUED] sucralfate (CARAFATE) 1 g tablet Take 1 tablet (1 g total) by mouth 2 (two) times daily after a meal.   sucralfate (CARAFATE) 1 g tablet Take 1 tablet (1 g total) by mouth 2 (two) times daily. 2 times daily before meals and at bedtime   No facility-administered encounter medications on file as of 10/15/2022.    No past surgical history on file.  Review of Systems  Constitutional:  Negative  for chills and fever.  Eyes:  Negative for blurred vision.  Respiratory:  Negative for shortness of breath.   Cardiovascular:  Negative for chest pain.  Gastrointestinal:  Positive for abdominal pain, constipation and heartburn. Negative for blood in stool, diarrhea, melena, nausea and vomiting.  Genitourinary:  Negative for dysuria.  Neurological:  Negative for dizziness and headaches.      Objective    BP 112/69   Pulse 63   Ht 6\' 2"  (1.88 m)   Wt 205 lb (93 kg)   SpO2 96%   BMI 26.32 kg/m   Physical Exam Cardiovascular:     Rate and Rhythm: Regular rhythm.     Pulses: Normal pulses.     Heart sounds: Normal heart sounds.  Pulmonary:     Effort: Pulmonary effort is normal.     Breath sounds: Normal breath sounds.  Abdominal:     General: There is no distension.     Palpations: Abdomen is soft. There is no mass.     Tenderness: There is no abdominal tenderness. There is no right CVA tenderness, left CVA tenderness or guarding.  Musculoskeletal:        General: Normal range of motion.  Skin:    General: Skin is warm and dry.     Capillary Refill: Capillary refill takes less than 2 seconds.  Neurological:     General: No focal deficit present.     Mental Status: He is alert.     Coordination:  Coordination normal.     Gait: Gait normal.       Assessment & Plan:  Heartburn -     Sucralfate; Take 1 tablet (1 g total) by mouth 2 (two) times daily. 2 times daily before meals and at bedtime  Dispense: 60 tablet; Refill: 0  Gastroesophageal reflux disease, unspecified whether esophagitis present Assessment & Plan: Explained to patient lifestyle modifications, weight loss, smoking cessation, loose clothing, and avoiding caffeine, fatty and trigger foods.  Patient has an appointment with GI on 3/28 encouraged to follow up  Started on Carafate 1g 2 times daily before meal Continue omeprazole 40 mg twice daily      No follow-ups on file.   Renard Hamper Ria Comment,  FNP

## 2022-10-15 NOTE — Assessment & Plan Note (Deleted)
Explained to patient lifestyle modifications, weight loss, smoking cessation, loose clothing, and avoiding caffeine and trigger foods.  Patient has an appointment with GI on 3/28 encouraged to follow up  Started on Carafate 1g 2 times daily before meals

## 2022-10-15 NOTE — Patient Instructions (Addendum)
It was pleasure meeting with you today. Please take medications as prescribed. Follow up with your GI appointment on Thursday

## 2022-10-19 DIAGNOSIS — M5416 Radiculopathy, lumbar region: Secondary | ICD-10-CM | POA: Diagnosis not present

## 2022-10-20 ENCOUNTER — Other Ambulatory Visit: Payer: Self-pay | Admitting: Physician Assistant

## 2022-10-20 ENCOUNTER — Ambulatory Visit: Payer: BLUE CROSS/BLUE SHIELD | Admitting: Internal Medicine

## 2022-10-20 DIAGNOSIS — M5416 Radiculopathy, lumbar region: Secondary | ICD-10-CM

## 2022-10-21 DIAGNOSIS — K219 Gastro-esophageal reflux disease without esophagitis: Secondary | ICD-10-CM | POA: Diagnosis not present

## 2022-10-21 DIAGNOSIS — K625 Hemorrhage of anus and rectum: Secondary | ICD-10-CM | POA: Diagnosis not present

## 2022-10-27 DIAGNOSIS — R252 Cramp and spasm: Secondary | ICD-10-CM | POA: Diagnosis not present

## 2022-10-27 DIAGNOSIS — M545 Low back pain, unspecified: Secondary | ICD-10-CM | POA: Diagnosis not present

## 2022-10-27 DIAGNOSIS — M79671 Pain in right foot: Secondary | ICD-10-CM | POA: Diagnosis not present

## 2022-10-27 DIAGNOSIS — J019 Acute sinusitis, unspecified: Secondary | ICD-10-CM | POA: Diagnosis not present

## 2022-10-28 ENCOUNTER — Ambulatory Visit (INDEPENDENT_AMBULATORY_CARE_PROVIDER_SITE_OTHER): Payer: BLUE CROSS/BLUE SHIELD | Admitting: Family Medicine

## 2022-10-28 ENCOUNTER — Encounter: Payer: Self-pay | Admitting: Family Medicine

## 2022-10-28 VITALS — BP 138/82 | HR 79 | Ht 74.0 in | Wt 205.0 lb

## 2022-10-28 DIAGNOSIS — M5126 Other intervertebral disc displacement, lumbar region: Secondary | ICD-10-CM | POA: Diagnosis not present

## 2022-10-28 DIAGNOSIS — M79606 Pain in leg, unspecified: Secondary | ICD-10-CM

## 2022-10-28 DIAGNOSIS — J0110 Acute frontal sinusitis, unspecified: Secondary | ICD-10-CM | POA: Diagnosis not present

## 2022-10-28 DIAGNOSIS — E559 Vitamin D deficiency, unspecified: Secondary | ICD-10-CM | POA: Diagnosis not present

## 2022-10-28 DIAGNOSIS — E038 Other specified hypothyroidism: Secondary | ICD-10-CM | POA: Diagnosis not present

## 2022-10-28 DIAGNOSIS — R7301 Impaired fasting glucose: Secondary | ICD-10-CM

## 2022-10-28 DIAGNOSIS — E7849 Other hyperlipidemia: Secondary | ICD-10-CM | POA: Diagnosis not present

## 2022-10-28 DIAGNOSIS — J329 Chronic sinusitis, unspecified: Secondary | ICD-10-CM | POA: Insufficient documentation

## 2022-10-28 DIAGNOSIS — M545 Low back pain, unspecified: Secondary | ICD-10-CM

## 2022-10-28 HISTORY — DX: Chronic sinusitis, unspecified: J32.9

## 2022-10-28 MED ORDER — PROMETHAZINE-DM 6.25-15 MG/5ML PO SYRP: 5.0000 mL | ORAL_SOLUTION | Freq: Four times a day (QID) | ORAL | 0 refills | Status: DC | PRN

## 2022-10-28 MED ORDER — AZITHROMYCIN 250 MG PO TABS: ORAL_TABLET | ORAL | 0 refills | Status: AC

## 2022-10-28 MED ORDER — KETOROLAC TROMETHAMINE 60 MG/2ML IM SOLN
30.0000 mg | Freq: Once | INTRAMUSCULAR | Status: AC
  Administered 2022-10-28: 30 mg via INTRAMUSCULAR

## 2022-10-28 MED ORDER — KETOROLAC TROMETHAMINE 30 MG/ML IJ SOLN: 30.0000 mg | Freq: Once | INTRAMUSCULAR | Status: DC

## 2022-10-28 NOTE — Assessment & Plan Note (Signed)
Chronic condition We will treat with Toradol 30 mg injection in clinic today Encouraged to continue taking Tylenol as needed for back pain

## 2022-10-28 NOTE — Patient Instructions (Addendum)
I appreciate the opportunity to provide care to you today!    Follow up:  4 months  Labs: please stop by the lab during the week to get your blood drawn (CBC, CMP, TSH, Lipid profile, HgA1c, Vit D)   Please pick up your medications at the pharmacy and start therapy  Take medication as prescribed. Increase fluids and allow for plenty of rest. Recommend Tylenol or ibuprofen as needed for pain, fever, or general discomfort. Warm salt water gargles 3-4 times daily to help with throat pain or discomfort. Recommend using a humidifier at bedtime during sleep to help with cough and nasal congestion. Follow-up if your symptoms do not improve     Please continue to a heart-healthy diet and increase your physical activities. Try to exercise for 75mins at least five days a week.      It was a pleasure to see you and I look forward to continuing to work together on your health and well-being. Please do not hesitate to call the office if you need care or have questions about your care.   Have a wonderful day and week. With Gratitude, Alvira Monday MSN, FNP-BC

## 2022-10-28 NOTE — Assessment & Plan Note (Signed)
Promethazine DM ordered for cough and cold symptoms Azithromycin the patient was encouraged Take medication as prescribed. Increase fluids and allow for plenty of rest. Recommend Tylenol or ibuprofen as needed for pain, fever, or general discomfort. Warm salt water gargles 3-4 times daily to help with throat pain or discomfort. Recommend using a humidifier at bedtime during sleep to help with cough and nasal congestion. Follow-up if your symptoms do not improve

## 2022-10-28 NOTE — Progress Notes (Signed)
Established Patient Office Visit  Subjective:  Patient ID: Jose Mitchell, male    DOB: 10-Mar-1981  Age: 42 y.o. MRN: [id number]  CC:  Chief Complaint  Patient presents with   Sore Throat    Pt reports waking up with sore throat, feels nauseated, and severe headache. Started on 10/26/2022, Took a covid test this morning it was neg.    Back Pain    Pt reports back flare up.     HPI Jose Mitchell is a 42 y.o. male with c/o of sore throat, cough, headaches, facial pain, facial pressure, and nasal congestion since 10/26/2022. He reports a negative home covid test. No sick contact reported. He has been taking Mucinex OTC.  He c/o of a flare up of his lower back pain.History of multiple herniated discs. Pain is rated 7/10. He has been following up with a spine and scoliosis specialist in [place] and reports completing steroid taper three days ago. No red flags symptoms reported.      Past Medical History:  Diagnosis Date   Alcohol abuse    Bipolar 1 disorder    Cocaine abuse    Schizophrenia     History reviewed. No pertinent surgical history.  Family History  Family history unknown: Yes    Social History   Socioeconomic History   Marital status: Single    Spouse name: Not on file   Number of children: Not on file   Years of education: Not on file   Highest education level: GED or equivalent  Occupational History   Not on file  Tobacco Use   Smoking status: Former    Packs/day: 1    Types: Cigarettes    Quit date: 2021    Years since quitting: 3.2   Smokeless tobacco: Current    Types: Chew  Vaping Use   Vaping Use: Never used  Substance and Sexual Activity   Alcohol use: Not Currently   Drug use: Not Currently    Types: Cocaine   Sexual activity: Not Currently  Other Topics Concern   Not on file  Social History Narrative   Not on file   Social Determinants of Health   Financial Resource Strain: High Risk (10/13/2022)   Overall Financial Resource  Strain (CARDIA)    Difficulty of Paying Living Expenses: Hard  Food Insecurity: Food Insecurity Present (10/13/2022)   Hunger Vital Sign    Worried About [place] in the Last Year: Sometimes true    Ran Out of Food in the Last Year: Sometimes true  Transportation Needs: No Transportation Needs (10/13/2022)   PRAPARE - Hydrologist (Medical): No    Lack of Transportation (Non-Medical): No  Physical Activity: Sufficiently Active (10/13/2022)   Exercise Vital Sign    Days of Exercise per Week: 6 days    Minutes of Exercise per Session: 150+ min  Stress: Stress Concern Present (10/13/2022)   [place]    Feeling of Stress : Very much  Social Connections: Moderately Isolated (10/13/2022)   Social Connection and Isolation Panel [NHANES]    Frequency of Communication with Friends and Family: Three times a week    Frequency of Social Gatherings with Friends and Family: Once a week    Attends Religious Services: More than 4 times per year    Active Member of [employer] or Organizations: No    Attends Archivist Meetings: Not on file  Marital Status: Divorced  Human resources officer Violence: Not on file    Outpatient Medications Prior to Visit  Medication Sig Dispense Refill   pantoprazole (PROTONIX) 40 MG tablet Take by mouth.     predniSONE (DELTASONE) 20 MG tablet Take 2 tablets (40 mg total) by mouth daily with breakfast. 10 tablet 0   sucralfate (CARAFATE) 1 g tablet Take 1 tablet (1 g total) by mouth 2 (two) times daily. 2 times daily before meals and at bedtime 60 tablet 0   cyclobenzaprine (FLEXERIL) 10 MG tablet Take 1 tablet (10 mg total) by mouth 3 (three) times daily as needed for muscle spasms. (Patient not taking: Reported on 10/28/2022) 30 tablet 0   omeprazole (PRILOSEC) 40 MG capsule Take 1 capsule (40 mg total) by mouth 2 (two) times daily. (Patient not taking: Reported on  10/28/2022) 180 capsule 1   No facility-administered medications prior to visit.    Allergies  Allergen Reactions   Septra [Sulfamethoxazole-Trimethoprim]     Childhood allergy. Does not know reaction    ROS Review of Systems  HENT:  Positive for congestion, sinus pressure, sinus pain and sore throat.   Respiratory:  Positive for cough.       Objective:    Physical Exam HENT:     Head: Normocephalic.     Right Ear: External ear normal.     Left Ear: External ear normal.     Nose: No congestion or rhinorrhea.     Mouth/Throat:     Mouth: Mucous membranes are moist.     Palate: No mass and lesions.     Pharynx: Uvula midline. No uvula swelling.  Cardiovascular:     Rate and Rhythm: Regular rhythm.     Heart sounds: No murmur heard. Pulmonary:     Effort: No respiratory distress.     Breath sounds: Normal breath sounds.  Musculoskeletal:     Comments: Tenderness to palpation of the lumbar musculature  Neurological:     Mental Status: He is alert.     BP 138/82 (BP Location: Left Arm)   Pulse 79   Ht 6\' 2"  (1.88 m)   Wt 205 lb (93 kg)   SpO2 96%   BMI 26.32 kg/m  Wt Readings from Last 3 Encounters:  10/28/22 205 lb (93 kg)  10/15/22 205 lb (93 kg)  10/04/22 215 lb 9.6 oz (97.8 kg)    Lab Results  Component Value Date   TSH 0.411 (L) 04/28/2022   Lab Results  Component Value Date   WBC 7.4 04/28/2022   HGB 16.0 04/28/2022   HCT 46.6 04/28/2022   MCV 88 04/28/2022   PLT 275 04/28/2022   Lab Results  Component Value Date   NA 140 04/28/2022   K 4.9 04/28/2022   CO2 26 04/28/2022   GLUCOSE 109 (H) 04/28/2022   BUN 10 04/28/2022   CREATININE 1.14 04/28/2022   BILITOT 0.3 04/28/2022   ALKPHOS 74 04/28/2022   AST 13 04/28/2022   ALT 14 04/28/2022   PROT 7.0 04/28/2022   ALBUMIN 4.7 04/28/2022   CALCIUM 9.7 04/28/2022   ANIONGAP 9 02/22/2018   EGFR 82 04/28/2022   Lab Results  Component Value Date   CHOL 160 04/28/2022   Lab Results   Component Value Date   HDL 39 (L) 04/28/2022   Lab Results  Component Value Date   LDLCALC 87 04/28/2022   Lab Results  Component Value Date   TRIG 200 (H) 04/28/2022   Lab  Results  Component Value Date   CHOLHDL 4.1 04/28/2022   Lab Results  Component Value Date   HGBA1C 5.8 (H) 04/28/2022      Assessment & Plan:  Low back pain radiating down leg Assessment & Plan: Chronic condition We will treat with Toradol 30 mg injection in clinic today Encouraged to continue taking Tylenol as needed for back pain   HNP (herniated nucleus pulposus), lumbar -     Ketorolac Tromethamine  Subacute frontal sinusitis Assessment & Plan: Promethazine DM ordered for cough and cold symptoms Azithromycin the patient was encouraged Take medication as prescribed. Increase fluids and allow for plenty of rest. Recommend Tylenol or ibuprofen as needed for pain, fever, or general discomfort. Warm salt water gargles 3-4 times daily to help with throat pain or discomfort. Recommend using a humidifier at bedtime during sleep to help with cough and nasal congestion. Follow-up if your symptoms do not improve    Orders: -     Promethazine-DM; Take 5 mLs by mouth 4 (four) times daily as needed.  Dispense: 118 mL; Refill: 0 -     Azithromycin; Take 2 tablets on day 1, then 1 tablet daily on days 2 through 5  Dispense: 6 tablet; Refill: 0  IFG (impaired fasting glucose) -     Hemoglobin A1c  Vitamin D deficiency -     VITAMIN D 25 Hydroxy (Vit-D Deficiency, Fractures)  Other specified hypothyroidism -     TSH + free T4  Other hyperlipidemia -     Lipid panel -     CMP14+EGFR -     CBC with Differential/Platelet    Follow-up: Return in about 4 months (around 02/27/2023).   Alvira Monday, FNP

## 2022-10-29 ENCOUNTER — Telehealth: Payer: Self-pay | Admitting: Family Medicine

## 2022-10-29 LAB — CMP14+EGFR
ALT: 19 IU/L (ref 0–44)
AST: 14 IU/L (ref 0–40)
Albumin/Globulin Ratio: 1.9 (ref 1.2–2.2)
Albumin: 4.5 g/dL (ref 4.1–5.1)
Alkaline Phosphatase: 83 IU/L (ref 44–121)
BUN/Creatinine Ratio: 15 (ref 9–20)
BUN: 17 mg/dL (ref 6–24)
Bilirubin Total: 0.2 mg/dL (ref 0.0–1.2)
CO2: 24 mmol/L (ref 20–29)
Calcium: 9.5 mg/dL (ref 8.7–10.2)
Chloride: 101 mmol/L (ref 96–106)
Creatinine, Ser: 1.14 mg/dL (ref 0.76–1.27)
Globulin, Total: 2.4 g/dL (ref 1.5–4.5)
Glucose: 95 mg/dL (ref 70–99)
Potassium: 4.3 mmol/L (ref 3.5–5.2)
Sodium: 141 mmol/L (ref 134–144)
Total Protein: 6.9 g/dL (ref 6.0–8.5)
eGFR: 82 mL/min/{1.73_m2} (ref >59)

## 2022-10-29 LAB — CBC WITH DIFFERENTIAL/PLATELET
Basophils Absolute: 0.1 10*3/uL (ref 0.0–0.2)
Basos: 1 %
EOS (ABSOLUTE): 0.6 10*3/uL — ABNORMAL HIGH (ref 0.0–0.4)
Eos: 6 %
Hematocrit: 47.3 % (ref 37.5–51.0)
Hemoglobin: 16.4 g/dL (ref 13.0–17.7)
Immature Grans (Abs): 0 10*3/uL (ref 0.0–0.1)
Immature Granulocytes: 0 %
Lymphocytes Absolute: 1.6 10*3/uL (ref 0.7–3.1)
Lymphs: 17 %
MCH: 30.4 pg (ref 26.6–33.0)
MCHC: 34.7 g/dL (ref 31.5–35.7)
MCV: 88 fL (ref 79–97)
Monocytes Absolute: 1.3 10*3/uL — ABNORMAL HIGH (ref 0.1–0.9)
Monocytes: 15 %
Neutrophils Absolute: 5.5 10*3/uL (ref 1.4–7.0)
Neutrophils: 61 %
Platelets: 259 10*3/uL (ref 150–450)
RBC: 5.39 x10E6/uL (ref 4.14–5.80)
RDW: 11.9 % (ref 11.6–15.4)
WBC: 9.1 10*3/uL (ref 3.4–10.8)

## 2022-10-29 LAB — LIPID PANEL
Chol/HDL Ratio: 3.8 ratio (ref 0.0–5.0)
Cholesterol, Total: 172 mg/dL (ref 100–199)
HDL: 45 mg/dL (ref >39)
LDL Chol Calc (NIH): 102 mg/dL — ABNORMAL HIGH (ref 0–99)
Triglycerides: 142 mg/dL (ref 0–149)
VLDL Cholesterol Cal: 25 mg/dL (ref 5–40)

## 2022-10-29 LAB — HEMOGLOBIN A1C
Est. average glucose Bld gHb Est-mCnc: 120 mg/dL
Hgb A1c MFr Bld: 5.8 % — ABNORMAL HIGH (ref 4.8–5.6)

## 2022-10-29 LAB — TSH+FREE T4
Free T4: 1.13 ng/dL (ref 0.82–1.77)
TSH: 0.551 u[IU]/mL (ref 0.450–4.500)

## 2022-10-29 LAB — VITAMIN D 25 HYDROXY (VIT D DEFICIENCY, FRACTURES): Vit D, 25-Hydroxy: 27.1 ng/mL — ABNORMAL LOW (ref 30.0–100.0)

## 2022-10-29 NOTE — Telephone Encounter (Signed)
Tiffany spoke with pt and he voiced understanding

## 2022-10-29 NOTE — Telephone Encounter (Signed)
Patient received his test results on mychart, does not understand, can nurse return his call.

## 2022-10-29 NOTE — Progress Notes (Signed)
Your hemoglobin A1c is 5.8. This indicates that you are prediabetic. Your LDL cholesterol has increased from 87 to 102. I want your LDL cholesterol to be less than 100. I recommend avoiding simple carbohydrates, including cakes, sweet desserts, ice cream, soda (diet or regular), sweet tea, candies, chips, cookies, store-bought juices, alcohol in excess of 1-2 drinks a day, lemonade, artificial sweeteners, donuts, coffee creamers, and sugar-free products.  I recommend avoiding greasy, fatty foods with increased physical activity.   I recommend taking over-the-counter vitamin D at 1000 IU daily. Your vitamin D is slightly low. I also recommend increasing your dietary intake of vitamin D.  All other labs are stable.

## 2022-11-02 DIAGNOSIS — M7918 Myalgia, other site: Secondary | ICD-10-CM | POA: Diagnosis not present

## 2022-11-02 DIAGNOSIS — M5416 Radiculopathy, lumbar region: Secondary | ICD-10-CM | POA: Diagnosis not present

## 2022-11-02 DIAGNOSIS — Z6826 Body mass index (BMI) 26.0-26.9, adult: Secondary | ICD-10-CM | POA: Diagnosis not present

## 2022-11-03 ENCOUNTER — Telehealth: Payer: Self-pay | Admitting: Family Medicine

## 2022-11-03 NOTE — Telephone Encounter (Signed)
Patient called need refill  HYDROcodone-acetaminophen (NORCO) 10-325 MG tablet   Pharmacy: [facility]  Please call patient back when this is sent.

## 2022-11-04 NOTE — Telephone Encounter (Signed)
Scheduled

## 2022-11-04 NOTE — Telephone Encounter (Signed)
Needs an appt

## 2022-11-09 ENCOUNTER — Encounter: Payer: Self-pay | Admitting: Family Medicine

## 2022-11-09 ENCOUNTER — Telehealth: Payer: Self-pay | Admitting: Family Medicine

## 2022-11-09 ENCOUNTER — Ambulatory Visit (INDEPENDENT_AMBULATORY_CARE_PROVIDER_SITE_OTHER): Payer: BLUE CROSS/BLUE SHIELD | Admitting: Family Medicine

## 2022-11-09 VITALS — BP 132/82 | HR 82 | Ht 74.0 in | Wt 210.1 lb

## 2022-11-09 DIAGNOSIS — M5126 Other intervertebral disc displacement, lumbar region: Secondary | ICD-10-CM

## 2022-11-09 DIAGNOSIS — M79606 Pain in leg, unspecified: Secondary | ICD-10-CM | POA: Diagnosis not present

## 2022-11-09 DIAGNOSIS — F319 Bipolar disorder, unspecified: Secondary | ICD-10-CM | POA: Diagnosis not present

## 2022-11-09 DIAGNOSIS — K219 Gastro-esophageal reflux disease without esophagitis: Secondary | ICD-10-CM

## 2022-11-09 MED ORDER — PANTOPRAZOLE SODIUM 40 MG PO TBEC: 40.0000 mg | DELAYED_RELEASE_TABLET | Freq: Every day | ORAL | 2 refills | Status: DC

## 2022-11-09 MED ORDER — GABAPENTIN 300 MG PO CAPS: 300.0000 mg | ORAL_CAPSULE | Freq: Two times a day (BID) | ORAL | 1 refills | Status: DC | PRN

## 2022-11-09 MED ORDER — HYDROCODONE-ACETAMINOPHEN 5-325 MG PO TABS: 1.0000 | ORAL_TABLET | Freq: Every evening | ORAL | 0 refills | Status: DC | PRN

## 2022-11-09 NOTE — Progress Notes (Signed)
Established Patient Office Visit  Subjective:  Patient ID: Jose Mitchell, male    DOB: 09-14-1980  Age: 42 y.o. MRN: [id number]  CC:  Chief Complaint  Patient presents with   Back Pain    Pt reports back pain still present would like a refill on pain medication. Pt reports now having medicaid.     HPI Jose Mitchell is a 42 y.o. male with past medical history of HNP, GERD, and epigastric pain presents for medication refill of his narcotic. For the details of today's visit, please refer to the assessment and plan.  Past Medical History:  Diagnosis Date   Alcohol abuse    Bipolar 1 disorder    Cocaine abuse    Schizophrenia     History reviewed. No pertinent surgical history.  Family History  Family history unknown: Yes    Social History   Socioeconomic History   Marital status: Single    Spouse name: Not on file   Number of children: Not on file   Years of education: Not on file   Highest education level: GED or equivalent  Occupational History   Not on file  Tobacco Use   Smoking status: Former    Packs/day: 1    Types: Cigarettes    Quit date: 2021    Years since quitting: 3.2   Smokeless tobacco: Current    Types: Chew  Vaping Use   Vaping Use: Never used  Substance and Sexual Activity   Alcohol use: Not Currently   Drug use: Not Currently    Types: Cocaine   Sexual activity: Not Currently  Other Topics Concern   Not on file  Social History Narrative   Not on file   Social Determinants of Health   Financial Resource Strain: High Risk (10/13/2022)   Overall Financial Resource Strain (CARDIA)    Difficulty of Paying Living Expenses: Hard  Food Insecurity: Food Insecurity Present (10/13/2022)   Hunger Vital Sign    Worried About Running Out of Food in the Last Year: Sometimes true    Ran Out of Food in the Last Year: Sometimes true  Transportation Needs: No Transportation Needs (10/13/2022)   PRAPARE - Administrator, Civil Service  (Medical): No    Lack of Transportation (Non-Medical): No  Physical Activity: Sufficiently Active (10/13/2022)   Exercise Vital Sign    Days of Exercise per Week: 6 days    Minutes of Exercise per Session: 150+ min  Stress: Stress Concern Present (10/13/2022)   [employer] of Occupational Health - Occupational Stress Questionnaire    Feeling of Stress : Very much  Social Connections: Moderately Isolated (10/13/2022)   Social Connection and Isolation Panel [NHANES]    Frequency of Communication with Friends and Family: Three times a week    Frequency of Social Gatherings with Friends and Family: Once a week    Attends Religious Services: More than 4 times per year    Active Member of [employer] or Organizations: No    Attends Engineer, structural: Not on file    Marital Status: Divorced  Catering manager Violence: Not on file    Outpatient Medications Prior to Visit  Medication Sig Dispense Refill   omeprazole (PRILOSEC) 40 MG capsule Take 1 capsule (40 mg total) by mouth 2 (two) times daily. 180 capsule 1   sucralfate (CARAFATE) 1 g tablet Take 1 tablet (1 g total) by mouth 2 (two) times daily. 2 times daily before meals and at bedtime  60 tablet 0   cyclobenzaprine (FLEXERIL) 10 MG tablet Take 1 tablet (10 mg total) by mouth 3 (three) times daily as needed for muscle spasms. 30 tablet 0   gabapentin (NEURONTIN) 300 MG capsule Take 300 mg by mouth 2 (two) times daily as needed.     HYDROcodone-acetaminophen (NORCO/VICODIN) 5-325 MG tablet Take 1 tablet by mouth at bedtime as needed for severe pain.     pantoprazole (PROTONIX) 40 MG tablet Take by mouth.     predniSONE (DELTASONE) 20 MG tablet Take 2 tablets (40 mg total) by mouth daily with breakfast. 10 tablet 0   promethazine-dextromethorphan (PROMETHAZINE-DM) 6.25-15 MG/5ML syrup Take 5 mLs by mouth 4 (four) times daily as needed. 118 mL 0   No facility-administered medications prior to visit.    Allergies  Allergen  Reactions   Septra [Sulfamethoxazole-Trimethoprim]     Childhood allergy. Does not know reaction    ROS Review of Systems  Constitutional:  Negative for fatigue and fever.  Eyes:  Negative for visual disturbance.  Respiratory:  Negative for chest tightness and shortness of breath.   Cardiovascular:  Negative for chest pain and palpitations.  Neurological:  Negative for dizziness and headaches.      Objective:    Physical Exam HENT:     Head: Normocephalic.     Right Ear: External ear normal.     Left Ear: External ear normal.     Nose: No congestion or rhinorrhea.     Mouth/Throat:     Mouth: Mucous membranes are moist.  Cardiovascular:     Rate and Rhythm: Regular rhythm.     Heart sounds: No murmur heard. Pulmonary:     Effort: No respiratory distress.     Breath sounds: Normal breath sounds.  Neurological:     Mental Status: He is alert.     BP 132/82 (BP Location: Left Arm)   Pulse 82   Ht  (1.88 m)   Wt 210 lb 1.9 oz (95.3 kg)   SpO2 96%   BMI 26.98 kg/m  Wt Readings from Last 3 Encounters:  11/09/22 210 lb 1.9 oz (95.3 kg)  10/28/22 205 lb (93 kg)  10/15/22 205 lb (93 kg)    Lab Results  Component Value Date   TSH 0.551 10/28/2022   Lab Results  Component Value Date   WBC 9.1 10/28/2022   HGB 16.4 10/28/2022   HCT 47.3 10/28/2022   MCV 88 10/28/2022   PLT 259 10/28/2022   Lab Results  Component Value Date   NA 141 10/28/2022   K 4.3 10/28/2022   CO2 24 10/28/2022   GLUCOSE 95 10/28/2022   BUN 17 10/28/2022   CREATININE 1.14 10/28/2022   BILITOT 0.2 10/28/2022   ALKPHOS 83 10/28/2022   AST 14 10/28/2022   ALT 19 10/28/2022   PROT 6.9 10/28/2022   ALBUMIN 4.5 10/28/2022   CALCIUM 9.5 10/28/2022   ANIONGAP 9 02/22/2018   EGFR 82 10/28/2022   Lab Results  Component Value Date   CHOL 172 10/28/2022   Lab Results  Component Value Date   HDL 45 10/28/2022   Lab Results  Component Value Date   LDLCALC 102 (H) 10/28/2022    Lab Results  Component Value Date   TRIG 142 10/28/2022   Lab Results  Component Value Date   CHOLHDL 3.8 10/28/2022   Lab Results  Component Value Date   HGBA1C 5.8 (H) 10/28/2022      Assessment & Plan:  HNP (herniated nucleus pulposus), lumbar Assessment & Plan: Pain is rated today 4 out of 10 Positive straight leg test Refilled Norco 5-325 mg  Encouraged to continue following up with spine and scoliosis specialist in [place]    Low back pain radiating down leg -     HYDROcodone-Acetaminophen; Take 1 tablet by mouth at bedtime as needed for severe pain.  Dispense: 20 tablet; Refill: 0  Bipolar 1 disorder, depressed -     Gabapentin; Take 1 capsule (300 mg total) by mouth 2 (two) times daily as needed.  Dispense: 60 capsule; Refill: 1  Gastroesophageal reflux disease, unspecified whether esophagitis present -     Pantoprazole Sodium; Take 1 tablet (40 mg total) by mouth daily.  Dispense: 60 tablet; Refill: 2   Note: This chart has been completed using Engineer, civil (consulting) software, and while attempts have been made to ensure accuracy, certain words and phrases may not be transcribed as intended.    Follow-up: Return in about 4 months (around 02/28/2023).   [name], FNP

## 2022-11-09 NOTE — Assessment & Plan Note (Addendum)
Pain is rated today 4 out of 10 Positive straight leg test Refilled Norco 5-325 mg  Encouraged to continue following up with spine and scoliosis specialist in [facility] [address]

## 2022-11-09 NOTE — Patient Instructions (Addendum)
I appreciate the opportunity to provide care to you today!    Follow up:  02/28/2023   Refills sent to your pharmacy    Please continue to a heart-healthy diet and increase your physical activities. Try to exercise for at least five days a week.      It was a pleasure to see you and I look forward to continuing to work together on your health and well-being. Please do not hesitate to call the office if you need care or have questions about your care.   Have a wonderful day and week. With Gratitude, [name] MSN, FNP-BC

## 2022-11-13 ENCOUNTER — Other Ambulatory Visit: Payer: BLUE CROSS/BLUE SHIELD

## 2022-11-15 NOTE — Telephone Encounter (Signed)
Na

## 2022-11-22 DIAGNOSIS — M48061 Spinal stenosis, lumbar region without neurogenic claudication: Secondary | ICD-10-CM | POA: Diagnosis not present

## 2022-11-22 DIAGNOSIS — M4726 Other spondylosis with radiculopathy, lumbar region: Secondary | ICD-10-CM | POA: Diagnosis not present

## 2022-11-24 ENCOUNTER — Telehealth: Payer: Self-pay | Admitting: Family Medicine

## 2022-11-24 DIAGNOSIS — M5416 Radiculopathy, lumbar region: Secondary | ICD-10-CM | POA: Diagnosis not present

## 2022-11-24 NOTE — Telephone Encounter (Signed)
Surgical clearance form  Noted  Copied Sleeved   In provider box.  Copy up front

## 2022-11-26 ENCOUNTER — Telehealth: Payer: Self-pay | Admitting: Family Medicine

## 2022-11-26 NOTE — Telephone Encounter (Signed)
Surgery clearance form spine & scolosis  specialists dr Sharolyn Douglas, md  Copied Noted sleeved

## 2022-11-29 DIAGNOSIS — K219 Gastro-esophageal reflux disease without esophagitis: Secondary | ICD-10-CM | POA: Diagnosis not present

## 2022-11-29 DIAGNOSIS — D122 Benign neoplasm of ascending colon: Secondary | ICD-10-CM | POA: Diagnosis not present

## 2022-11-29 DIAGNOSIS — K625 Hemorrhage of anus and rectum: Secondary | ICD-10-CM | POA: Diagnosis not present

## 2022-11-29 DIAGNOSIS — K635 Polyp of colon: Secondary | ICD-10-CM | POA: Diagnosis not present

## 2022-11-29 DIAGNOSIS — R1013 Epigastric pain: Secondary | ICD-10-CM | POA: Diagnosis not present

## 2022-11-29 DIAGNOSIS — K449 Diaphragmatic hernia without obstruction or gangrene: Secondary | ICD-10-CM | POA: Diagnosis not present

## 2022-11-29 DIAGNOSIS — K5900 Constipation, unspecified: Secondary | ICD-10-CM | POA: Diagnosis not present

## 2022-11-29 DIAGNOSIS — K648 Other hemorrhoids: Secondary | ICD-10-CM | POA: Diagnosis not present

## 2022-11-30 ENCOUNTER — Telehealth: Payer: Self-pay | Admitting: Family Medicine

## 2022-11-30 NOTE — Telephone Encounter (Signed)
m °

## 2022-12-01 DIAGNOSIS — Z860101 Personal history of adenomatous and serrated colon polyps: Secondary | ICD-10-CM | POA: Insufficient documentation

## 2022-12-01 DIAGNOSIS — K449 Diaphragmatic hernia without obstruction or gangrene: Secondary | ICD-10-CM | POA: Insufficient documentation

## 2022-12-01 DIAGNOSIS — K649 Unspecified hemorrhoids: Secondary | ICD-10-CM | POA: Insufficient documentation

## 2022-12-01 DIAGNOSIS — K219 Gastro-esophageal reflux disease without esophagitis: Secondary | ICD-10-CM | POA: Insufficient documentation

## 2022-12-01 HISTORY — DX: Unspecified hemorrhoids: K64.9

## 2022-12-01 HISTORY — DX: Personal history of adenomatous and serrated colon polyps: Z86.0101

## 2022-12-01 HISTORY — DX: Diaphragmatic hernia without obstruction or gangrene: K44.9

## 2022-12-01 HISTORY — DX: Gastro-esophageal reflux disease without esophagitis: K21.9

## 2022-12-03 ENCOUNTER — Ambulatory Visit (INDEPENDENT_AMBULATORY_CARE_PROVIDER_SITE_OTHER): Payer: BLUE CROSS/BLUE SHIELD | Admitting: Family Medicine

## 2022-12-03 ENCOUNTER — Ambulatory Visit (HOSPITAL_COMMUNITY)
Admission: RE | Admit: 2022-12-03 | Discharge: 2022-12-03 | Disposition: A | Discharge status: Home / Self Care | Payer: BLUE CROSS/BLUE SHIELD | Source: Ambulatory Visit | Attending: Family Medicine | Admitting: Family Medicine

## 2022-12-03 ENCOUNTER — Encounter: Payer: Self-pay | Admitting: Family Medicine

## 2022-12-03 VITALS — BP 135/88 | HR 63 | Ht 74.0 in | Wt 207.0 lb

## 2022-12-03 DIAGNOSIS — Z01818 Encounter for other preprocedural examination: Secondary | ICD-10-CM | POA: Diagnosis not present

## 2022-12-03 DIAGNOSIS — E7849 Other hyperlipidemia: Secondary | ICD-10-CM

## 2022-12-03 DIAGNOSIS — Z0389 Encounter for observation for other suspected diseases and conditions ruled out: Secondary | ICD-10-CM | POA: Diagnosis not present

## 2022-12-03 HISTORY — DX: Encounter for other preprocedural examination: Z01.818

## 2022-12-03 LAB — POCT URINALYSIS DIP (CLINITEK)
Bilirubin, UA: NEGATIVE
Blood, UA: NEGATIVE
Glucose, UA: NEGATIVE mg/dL
Ketones, POC UA: NEGATIVE mg/dL
Leukocytes, UA: NEGATIVE
Nitrite, UA: NEGATIVE
POC PROTEIN,UA: NEGATIVE
Spec Grav, UA: >=1.03 — AB (ref 1.010–1.025)
Urobilinogen, UA: 0.2 E.U./dL
pH, UA: 6 (ref 5.0–8.0)

## 2022-12-03 NOTE — Patient Instructions (Addendum)
I appreciate the opportunity to provide care to you today!    Follow up:  02/28/2023  Labs: please stop by the lab today at 1 pm to get your blood drawn (CBC, BMP,    Please stop by [facility] hospital anytime to get an x-ray of of your chest  Please continue to a heart-healthy diet and increase your physical activities. Try to exercise for at least five days a week.      It was a pleasure to see you and I look forward to continuing to work together on your health and well-being. Please do not hesitate to call the office if you need care or have questions about your care.   Have a wonderful day and week. With Gratitude, [name] MSN, FNP-BC

## 2022-12-03 NOTE — Assessment & Plan Note (Signed)
Patient needs surgical clearance for back surgery UA negative for nitrates and leukocytes Chest x-ray daughter EKG shows sinus rhythm Pending CBC and BMP

## 2022-12-03 NOTE — Progress Notes (Signed)
Established Patient Office Visit  Subjective:  Patient ID: Jose Mitchell, male    DOB: Aug 13, 1980  Age: 42 y.o. MRN: [id number]  CC:  Chief Complaint  Patient presents with   Pre-op Exam    Preoperative exam.     HPI Jose Mitchell is a 43 y.o. male with past medical history of Herniated nucleus pulposus of the lumbar presents for surgical clearance. For the details of today's visit, please refer to the assessment and plan.      Past Medical History:  Diagnosis Date   Alcohol abuse    Bipolar 1 disorder (HCC)    Cocaine abuse (HCC)    Schizophrenia (HCC)     History reviewed. No pertinent surgical history.  Family History  Family history unknown: Yes    Social History   Socioeconomic History   Marital status: Single    Spouse name: Not on file   Number of children: Not on file   Years of education: Not on file   Highest education level: GED or equivalent  Occupational History   Not on file  Tobacco Use   Smoking status: Former    Packs/day: 1    Types: Cigarettes    Quit date: 2021    Years since quitting: 3.3   Smokeless tobacco: Current    Types: Chew  Vaping Use   Vaping Use: Never used  Substance and Sexual Activity   Alcohol use: Not Currently   Drug use: Not Currently    Types: Cocaine   Sexual activity: Not Currently  Other Topics Concern   Not on file  Social History Narrative   Not on file   Social Determinants of Health   Financial Resource Strain: High Risk (10/13/2022)   Overall Financial Resource Strain (CARDIA)    Difficulty of Paying Living Expenses: Hard  Food Insecurity: Food Insecurity Present (10/13/2022)   Hunger Vital Sign    Worried About Running Out of Food in the Last Year: Sometimes true    Ran Out of Food in the Last Year: Sometimes true  Transportation Needs: No Transportation Needs (10/13/2022)   PRAPARE - Administrator, Civil Service (Medical): No    Lack of Transportation (Non-Medical): No  Physical  Activity: Sufficiently Active (10/13/2022)   Exercise Vital Sign    Days of Exercise per Week: 6 days    Minutes of Exercise per Session: 150+ min  Stress: Stress Concern Present (10/13/2022)   [employer] of Occupational Health - Occupational Stress Questionnaire    Feeling of Stress : Very much  Social Connections: Moderately Isolated (10/13/2022)   Social Connection and Isolation Panel [NHANES]    Frequency of Communication with Friends and Family: Three times a week    Frequency of Social Gatherings with Friends and Family: Once a week    Attends Religious Services: More than 4 times per year    Active Member of [employer] or Organizations: No    Attends Engineer, structural: Not on file    Marital Status: Divorced  Intimate Partner Violence: Not on file    Outpatient Medications Prior to Visit  Medication Sig Dispense Refill   gabapentin (NEURONTIN) 300 MG capsule Take 1 capsule (300 mg total) by mouth 2 (two) times daily as needed. 60 capsule 1   HYDROcodone-acetaminophen (NORCO/VICODIN) 5-325 MG tablet Take 1 tablet by mouth at bedtime as needed for severe pain. 20 tablet 0   omeprazole (PRILOSEC) 40 MG capsule Take 1 capsule (40 mg total) by  mouth 2 (two) times daily. 180 capsule 1   pantoprazole (PROTONIX) 40 MG tablet Take 1 tablet (40 mg total) by mouth daily. 60 tablet 2   sucralfate (CARAFATE) 1 g tablet Take 1 tablet (1 g total) by mouth 2 (two) times daily. 2 times daily before meals and at bedtime 60 tablet 0   No facility-administered medications prior to visit.    Allergies  Allergen Reactions   Septra [Sulfamethoxazole-Trimethoprim]     Childhood allergy. Does not know reaction    ROS Review of Systems  Constitutional:  Negative for fatigue and fever.  Eyes:  Negative for visual disturbance.  Respiratory:  Negative for chest tightness and shortness of breath.   Cardiovascular:  Negative for chest pain and palpitations.  Neurological:  Negative for  dizziness and headaches.      Objective:    Physical Exam HENT:     Head: Normocephalic.     Right Ear: External ear normal.     Left Ear: External ear normal.     Nose: No congestion or rhinorrhea.     Mouth/Throat:     Mouth: Mucous membranes are moist.  Cardiovascular:     Rate and Rhythm: Regular rhythm.     Heart sounds: No murmur heard. Pulmonary:     Effort: No respiratory distress.     Breath sounds: Normal breath sounds.  Musculoskeletal:     Lumbar back: No tenderness. Normal range of motion.  Neurological:     Mental Status: He is alert.     BP 135/88   Pulse 63   Ht 6\' 2"  (1.88 m)   Wt 207 lb (93.9 kg)   SpO2 97%   BMI 26.58 kg/m  Wt Readings from Last 3 Encounters:  12/03/22 207 lb (93.9 kg)  11/09/22 210 lb 1.9 oz (95.3 kg)  10/28/22 205 lb (93 kg)    Lab Results  Component Value Date   TSH 0.551 10/28/2022   Lab Results  Component Value Date   WBC 9.1 10/28/2022   HGB 16.4 10/28/2022   HCT 47.3 10/28/2022   MCV 88 10/28/2022   PLT 259 10/28/2022   Lab Results  Component Value Date   NA 141 10/28/2022   K 4.3 10/28/2022   CO2 24 10/28/2022   GLUCOSE 95 10/28/2022   BUN 17 10/28/2022   CREATININE 1.14 10/28/2022   BILITOT 0.2 10/28/2022   ALKPHOS 83 10/28/2022   AST 14 10/28/2022   ALT 19 10/28/2022   PROT 6.9 10/28/2022   ALBUMIN 4.5 10/28/2022   CALCIUM 9.5 10/28/2022   ANIONGAP 9 02/22/2018   EGFR 82 10/28/2022   Lab Results  Component Value Date   CHOL 172 10/28/2022   Lab Results  Component Value Date   HDL 45 10/28/2022   Lab Results  Component Value Date   LDLCALC 102 (H) 10/28/2022   Lab Results  Component Value Date   TRIG 142 10/28/2022   Lab Results  Component Value Date   CHOLHDL 3.8 10/28/2022   Lab Results  Component Value Date   HGBA1C 5.8 (H) 10/28/2022      Assessment & Plan:  Pre-operative clearance Assessment & Plan: Patient needs surgical clearance for back surgery UA negative for  nitrates and leukocytes Chest x-ray daughter EKG shows sinus rhythm Pending CBC and BMP  Orders: -     POCT URINALYSIS DIP (CLINITEK) -     EKG 12-Lead -     DG Chest 2 View -  CBC with Differential/Platelet -     BMP8+EGFR  Other hyperlipidemia    Follow-up: Return in about 3 months (around 02/28/2023).   [name], FNP

## 2022-12-04 LAB — CBC WITH DIFFERENTIAL/PLATELET
Basophils Absolute: 0.1 10*3/uL (ref 0.0–0.2)
Basos: 1 %
EOS (ABSOLUTE): 0.1 10*3/uL (ref 0.0–0.4)
Eos: 2 %
Hematocrit: 48.6 % (ref 37.5–51.0)
Hemoglobin: 16.2 g/dL (ref 13.0–17.7)
Immature Grans (Abs): 0 10*3/uL (ref 0.0–0.1)
Immature Granulocytes: 0 %
Lymphocytes Absolute: 2.6 10*3/uL (ref 0.7–3.1)
Lymphs: 33 %
MCH: 29.5 pg (ref 26.6–33.0)
MCHC: 33.3 g/dL (ref 31.5–35.7)
MCV: 88 fL (ref 79–97)
Monocytes Absolute: 0.6 10*3/uL (ref 0.1–0.9)
Monocytes: 7 %
Neutrophils Absolute: 4.5 10*3/uL (ref 1.4–7.0)
Neutrophils: 57 %
Platelets: 271 10*3/uL (ref 150–450)
RBC: 5.5 x10E6/uL (ref 4.14–5.80)
RDW: 12.1 % (ref 11.6–15.4)
WBC: 7.9 10*3/uL (ref 3.4–10.8)

## 2022-12-04 LAB — BMP8+EGFR
BUN/Creatinine Ratio: 13 (ref 9–20)
BUN: 13 mg/dL (ref 6–24)
CO2: 21 mmol/L (ref 20–29)
Calcium: 9.9 mg/dL (ref 8.7–10.2)
Chloride: 101 mmol/L (ref 96–106)
Creatinine, Ser: 1.04 mg/dL (ref 0.76–1.27)
Glucose: 94 mg/dL (ref 70–99)
Potassium: 4.5 mmol/L (ref 3.5–5.2)
Sodium: 140 mmol/L (ref 134–144)
eGFR: 91 mL/min/{1.73_m2} (ref >59)

## 2022-12-07 ENCOUNTER — Ambulatory Visit: Payer: BLUE CROSS/BLUE SHIELD | Admitting: Family Medicine

## 2022-12-08 DIAGNOSIS — M48062 Spinal stenosis, lumbar region with neurogenic claudication: Secondary | ICD-10-CM | POA: Insufficient documentation

## 2022-12-08 HISTORY — DX: Spinal stenosis, lumbar region with neurogenic claudication: M48.062

## 2022-12-09 DIAGNOSIS — M5416 Radiculopathy, lumbar region: Secondary | ICD-10-CM | POA: Diagnosis not present

## 2022-12-10 ENCOUNTER — Ambulatory Visit: Payer: BLUE CROSS/BLUE SHIELD | Admitting: Family Medicine

## 2022-12-11 ENCOUNTER — Other Ambulatory Visit: Payer: Self-pay | Admitting: Family Medicine

## 2022-12-11 DIAGNOSIS — M545 Low back pain, unspecified: Secondary | ICD-10-CM

## 2022-12-13 ENCOUNTER — Telehealth: Payer: Self-pay | Admitting: Family Medicine

## 2022-12-13 ENCOUNTER — Other Ambulatory Visit: Payer: Self-pay | Admitting: Family Medicine

## 2022-12-13 DIAGNOSIS — M79606 Pain in leg, unspecified: Secondary | ICD-10-CM

## 2022-12-13 DIAGNOSIS — M5416 Radiculopathy, lumbar region: Secondary | ICD-10-CM | POA: Diagnosis not present

## 2022-12-13 DIAGNOSIS — Z01818 Encounter for other preprocedural examination: Secondary | ICD-10-CM | POA: Diagnosis not present

## 2022-12-13 DIAGNOSIS — M545 Low back pain, unspecified: Secondary | ICD-10-CM

## 2022-12-13 NOTE — Telephone Encounter (Signed)
Pt states neurosurgeon is supposed to be taking over everything afterwards.

## 2022-12-13 NOTE — Telephone Encounter (Signed)
Kindly ask the patient to follow up with neurosurgeon

## 2022-12-13 NOTE — Telephone Encounter (Signed)
Pt states he has 6 pills left and may need some for after surgery asking for a refill?

## 2022-12-13 NOTE — Telephone Encounter (Signed)
Patient called asking once surgery Thursday and admitted into the hospital will the hospitalist medicine instead Jose Mitchell? Patient asked for nurse to contact him at [phone].

## 2022-12-16 DIAGNOSIS — M5116 Intervertebral disc disorders with radiculopathy, lumbar region: Secondary | ICD-10-CM | POA: Diagnosis not present

## 2022-12-16 DIAGNOSIS — Z981 Arthrodesis status: Secondary | ICD-10-CM | POA: Diagnosis not present

## 2022-12-16 DIAGNOSIS — M48062 Spinal stenosis, lumbar region with neurogenic claudication: Secondary | ICD-10-CM | POA: Diagnosis not present

## 2022-12-16 DIAGNOSIS — M5136 Other intervertebral disc degeneration, lumbar region: Secondary | ICD-10-CM | POA: Diagnosis not present

## 2022-12-16 DIAGNOSIS — M4726 Other spondylosis with radiculopathy, lumbar region: Secondary | ICD-10-CM | POA: Diagnosis not present

## 2022-12-16 DIAGNOSIS — M5117 Intervertebral disc disorders with radiculopathy, lumbosacral region: Secondary | ICD-10-CM | POA: Diagnosis not present

## 2022-12-21 ENCOUNTER — Other Ambulatory Visit: Payer: Self-pay

## 2022-12-21 DIAGNOSIS — K219 Gastro-esophageal reflux disease without esophagitis: Secondary | ICD-10-CM

## 2022-12-21 MED ORDER — PANTOPRAZOLE SODIUM 40 MG PO TBEC: 40.0000 mg | DELAYED_RELEASE_TABLET | Freq: Every day | ORAL | 2 refills | Status: DC

## 2022-12-21 NOTE — Telephone Encounter (Signed)
Spoke to pt asked for a refill on protonix, refill sent.

## 2023-01-04 ENCOUNTER — Telehealth: Payer: Self-pay | Admitting: Family Medicine

## 2023-01-04 NOTE — Telephone Encounter (Signed)
Patient called still waiting on a call back from [facility] regarding billing

## 2023-01-12 NOTE — Telephone Encounter (Signed)
Spoke with patient on 6/12

## 2023-01-13 DIAGNOSIS — M5416 Radiculopathy, lumbar region: Secondary | ICD-10-CM | POA: Diagnosis not present

## 2023-01-26 DIAGNOSIS — F251 Schizoaffective disorder, depressive type: Secondary | ICD-10-CM | POA: Diagnosis not present

## 2023-01-28 DIAGNOSIS — F251 Schizoaffective disorder, depressive type: Secondary | ICD-10-CM | POA: Insufficient documentation

## 2023-01-28 HISTORY — DX: Schizoaffective disorder, depressive type: F25.1

## 2023-02-02 DIAGNOSIS — F251 Schizoaffective disorder, depressive type: Secondary | ICD-10-CM | POA: Diagnosis not present

## 2023-02-03 ENCOUNTER — Encounter (HOSPITAL_COMMUNITY): Payer: Self-pay

## 2023-02-03 ENCOUNTER — Other Ambulatory Visit: Payer: Self-pay

## 2023-02-03 ENCOUNTER — Emergency Department (HOSPITAL_COMMUNITY)
Admission: EM | Admit: 2023-02-03 | Discharge: 2023-02-03 | Disposition: A | Discharge status: Home / Self Care | Payer: BLUE CROSS/BLUE SHIELD | Attending: Student | Admitting: Student

## 2023-02-03 DIAGNOSIS — M79662 Pain in left lower leg: Secondary | ICD-10-CM | POA: Diagnosis not present

## 2023-02-03 DIAGNOSIS — Z136 Encounter for screening for cardiovascular disorders: Secondary | ICD-10-CM | POA: Diagnosis not present

## 2023-02-03 DIAGNOSIS — M48062 Spinal stenosis, lumbar region with neurogenic claudication: Secondary | ICD-10-CM | POA: Diagnosis not present

## 2023-02-03 DIAGNOSIS — Z833 Family history of diabetes mellitus: Secondary | ICD-10-CM | POA: Diagnosis not present

## 2023-02-03 DIAGNOSIS — F3181 Bipolar II disorder: Secondary | ICD-10-CM | POA: Diagnosis not present

## 2023-02-03 DIAGNOSIS — M79661 Pain in right lower leg: Secondary | ICD-10-CM | POA: Diagnosis not present

## 2023-02-03 DIAGNOSIS — K219 Gastro-esophageal reflux disease without esophagitis: Secondary | ICD-10-CM | POA: Diagnosis not present

## 2023-02-03 DIAGNOSIS — M79604 Pain in right leg: Secondary | ICD-10-CM | POA: Diagnosis not present

## 2023-02-03 DIAGNOSIS — M79605 Pain in left leg: Secondary | ICD-10-CM | POA: Diagnosis not present

## 2023-02-03 DIAGNOSIS — R140 Abdominal distension (gaseous): Secondary | ICD-10-CM | POA: Diagnosis not present

## 2023-02-03 DIAGNOSIS — F411 Generalized anxiety disorder: Secondary | ICD-10-CM | POA: Diagnosis not present

## 2023-02-03 DIAGNOSIS — Z0000 Encounter for general adult medical examination without abnormal findings: Secondary | ICD-10-CM | POA: Diagnosis not present

## 2023-02-03 DIAGNOSIS — K649 Unspecified hemorrhoids: Secondary | ICD-10-CM | POA: Diagnosis not present

## 2023-02-03 DIAGNOSIS — E538 Deficiency of other specified B group vitamins: Secondary | ICD-10-CM | POA: Diagnosis not present

## 2023-02-03 DIAGNOSIS — E559 Vitamin D deficiency, unspecified: Secondary | ICD-10-CM | POA: Diagnosis not present

## 2023-02-03 DIAGNOSIS — Z13220 Encounter for screening for lipoid disorders: Secondary | ICD-10-CM | POA: Diagnosis not present

## 2023-02-03 LAB — D-DIMER, QUANTITATIVE: D-Dimer, Quant: 0.36 ug/mL-FEU (ref 0.00–0.50)

## 2023-02-03 LAB — MAGNESIUM: Magnesium: 2 mg/dL (ref 1.7–2.4)

## 2023-02-03 MED ORDER — METHOCARBAMOL 750 MG PO TABS: 750.0000 mg | ORAL_TABLET | Freq: Three times a day (TID) | ORAL | 0 refills | Status: DC

## 2023-02-03 MED ORDER — METHOCARBAMOL 500 MG PO TABS
500.0000 mg | ORAL_TABLET | Freq: Once | ORAL | Status: AC
  Administered 2023-02-03: 500 mg via ORAL
  Filled 2023-02-03: qty 1

## 2023-02-03 NOTE — ED Provider Notes (Incomplete)
[place] EMERGENCY DEPARTMENT AT [facility] Provider Note   CSN: [id number] Arrival date & time: 02/03/23  1857     History {Add pertinent medical, surgical, social history, OB history to HPI:1} Chief Complaint  Patient presents with  . Leg Cramps    Jose Mitchell is a 42 y.o. male.  Presents the ER today for bilateral calf pain.  States it is cramping and goes into his feet.  Is been going on for couple of months and has gotten worse.  Back surgery on May 23.  States prior to that he was occasionally having the symptoms in the right leg only and it is bilateral.  He denies any increase or change his back pain, no fevers or chills, no saddle anesthesia or paresthesia, no bowel or bladder incontinence.  He states he was specifically worried about blood clot in his leg which brought him to the ER today.  He was seen by his Peace today to establish care and had some basic labs drawn but states they did not address his leg pain with him today.  HPI     Home Medications Prior to Admission medications   Medication Sig Start Date End Date Taking? Authorizing Provider  gabapentin (NEURONTIN) 300 MG capsule Take 1 capsule (300 mg total) by mouth 2 (two) times daily as needed. 11/09/22   [name], FNP  HYDROcodone-acetaminophen (NORCO/VICODIN) 5-325 MG tablet Take 1 tablet by mouth at bedtime as needed for severe pain. 11/09/22   [name], FNP  omeprazole (PRILOSEC) 40 MG capsule Take 1 capsule (40 mg total) by mouth 2 (two) times daily. 10/04/22   [name], MD  pantoprazole (PROTONIX) 40 MG tablet Take 1 tablet (40 mg total) by mouth daily. 12/21/22 06/19/23  [name], FNP  sucralfate (CARAFATE) 1 g tablet Take 1 tablet (1 g total) by mouth 2 (two) times daily. 2 times daily before meals and at bedtime 10/15/22 11/14/22  Del Nigel Berthold, FNP      Allergies    Septra [sulfamethoxazole-trimethoprim]    Review of Systems   Review of  Systems  Physical Exam Updated Vital Signs BP (!) 133/93   Pulse (!) 112   Temp 98 F (36.7 C) (Oral)   Resp 20   Ht 6\' 2"  (1.88 m)   Wt 93.9 kg   SpO2 98%   BMI 26.58 kg/m  Physical Exam  ED Results / Procedures / Treatments   Labs (all labs ordered are listed, but only abnormal results are displayed) Labs Reviewed  D-DIMER, QUANTITATIVE  MAGNESIUM    EKG None  Radiology No results found.  Procedures Procedures  {Document cardiac monitor, telemetry assessment procedure when appropriate:1}  Medications Ordered in ED Medications  methocarbamol (ROBAXIN) tablet 500 mg (500 mg Oral Given 02/03/23 2010)    ED Course/ Medical Decision Making/ A&P   {   Click here for ABCD2, HEART and other calculatorsREFRESH Note before signing :1}                          Medical Decision Making Amount and/or Complexity of Data Reviewed Labs: ordered.  Risk Prescription drug management.   ***  {Document critical care time when appropriate:1} {Document review of labs and clinical decision tools ie heart score, Chads2Vasc2 etc:1}  {Document your independent review of radiology images, and any outside records:1} {Document your discussion with family members, caretakers, and with consultants:1} {Document social determinants of health affecting pt's  care:1} {Document your decision making why or why not admission, treatments were needed:1} Final Clinical Impression(s) / ED Diagnoses Final diagnoses:  None    Rx / DC Orders ED Discharge Orders     None

## 2023-02-03 NOTE — Discharge Instructions (Signed)
Today for cramping in your legs.  Your blood work from your doctor was normal today regarding your blood counts and potassium level.  Your magnesium level is normal, your D-dimer was normal, ruling out a blood clot.  Is important to follow-up with your primary care doctor and your back specialist.  Come back to the ER for new or worsening symptoms. Given prescription for muscle relaxers since that seem to help your symptoms somewhat in the ER.  You Can try warm baths, warm compresses to the legs.

## 2023-02-03 NOTE — ED Triage Notes (Addendum)
Pt c/o bilateral leg cramps x few months that has gotten worse. Pt states that he had back surgery on May 23rd and that they have gotten worse since them. States pain is 10/10.  Ambulatory to triage

## 2023-02-03 NOTE — ED Provider Notes (Signed)
[place] EMERGENCY DEPARTMENT AT [facility] Provider Note   CSN: [id number] Arrival date & time: 02/03/23  1857     History  Chief Complaint  Patient presents with   Leg Cramps    Jose Mitchell is a 42 y.o. male.  Presents the ER today for bilateral calf pain.  States it is cramping and goes into his feet.  Is been going on for couple of months and has gotten worse.  Back surgery on May 23.  States prior to that he was occasionally having the symptoms in the right leg only and it is bilateral.  He denies any increase or change his back pain, no fevers or chills, no saddle anesthesia or paresthesia, no bowel or bladder incontinence.  He states he was specifically worried about blood clot in his leg which brought him to the ER today.  He was seen by his Peace today to establish care and had some basic labs drawn but states they did not address his leg pain with him today.  HPI     Home Medications Prior to Admission medications   Medication Sig Start Date End Date Taking? Authorizing Provider  gabapentin (NEURONTIN) 300 MG capsule Take 1 capsule (300 mg total) by mouth 2 (two) times daily as needed. 11/09/22   [name], FNP  HYDROcodone-acetaminophen (NORCO/VICODIN) 5-325 MG tablet Take 1 tablet by mouth at bedtime as needed for severe pain. 11/09/22   [name], FNP  methocarbamol (ROBAXIN) 750 MG tablet Take 1 tablet (750 mg total) by mouth 3 (three) times daily. 02/03/23   Carmel Sacramento A, PA-C  omeprazole (PRILOSEC) 40 MG capsule Take 1 capsule (40 mg total) by mouth 2 (two) times daily. 10/04/22   [name], MD  pantoprazole (PROTONIX) 40 MG tablet Take 1 tablet (40 mg total) by mouth daily. 12/21/22 06/19/23  [name], FNP  sucralfate (CARAFATE) 1 g tablet Take 1 tablet (1 g total) by mouth 2 (two) times daily. 2 times daily before meals and at bedtime 10/15/22 11/14/22  Del Nigel Berthold, FNP      Allergies    Septra  [sulfamethoxazole-trimethoprim]    Review of Systems   Review of Systems  Physical Exam Updated Vital Signs BP (!) 133/93   Pulse (!) 112   Temp 98 F (36.7 C) (Oral)   Resp 20   Ht 6\' 2"  (1.88 m)   Wt 93.9 kg   SpO2 98%   BMI 26.58 kg/m  Physical Exam Vitals and nursing note reviewed.  Constitutional:      General: He is not in acute distress.    Appearance: He is well-developed.  HENT:     Head: Normocephalic and atraumatic.     Mouth/Throat:     Mouth: Mucous membranes are moist.  Eyes:     Conjunctiva/sclera: Conjunctivae normal.  Cardiovascular:     Rate and Rhythm: Normal rate and regular rhythm.     Heart sounds: No murmur heard. Pulmonary:     Effort: Pulmonary effort is normal. No respiratory distress.     Breath sounds: Normal breath sounds.  Abdominal:     Palpations: Abdomen is soft.     Tenderness: There is no abdominal tenderness.  Musculoskeletal:        General: No swelling.     Cervical back: Neck supple.  Skin:    General: Skin is warm and dry.     Capillary Refill: Capillary refill takes less than 2 seconds.  Neurological:  General: No focal deficit present.     Mental Status: He is alert and oriented to person, place, and time.  Psychiatric:        Mood and Affect: Mood normal.     ED Results / Procedures / Treatments   Labs (all labs ordered are listed, but only abnormal results are displayed) Labs Reviewed  D-DIMER, QUANTITATIVE  MAGNESIUM    EKG None  Radiology No results found.  Procedures Procedures    Medications Ordered in ED Medications  methocarbamol (ROBAXIN) tablet 500 mg (500 mg Oral Given 02/03/23 2010)    ED Course/ Medical Decision Making/ A&P                             Medical Decision Making This patient presents to the ED for concern of lateral lower leg cramping for months, this involves an extensive number of treatment options, and is a complaint that carries with it a high risk of complications  and morbidity.  The differential diagnosis includes DVT, electrolyte abnormality, anemia, restless leg syndrome, other   Co morbidities that complicate the patient evaluation :   Chronic back pain   Additional history obtained:  Additional history obtained from EMR External records from outside source obtained and reviewed including notes, labs   Lab Tests:  I Ordered, and personally interpreted labs.  The pertinent results include: Magnesium normal, D-dimer is negative, outpatient CBC and CMP normal   Problem List / ED Course / Critical interventions / Medication management  Having bilateral leg cramping.  No swelling, no significant calf tenderness.  No recent surgery.  Patient has excellent pulses in his feet.  Able to ambulate that difficulty he got some relief with Robaxin.  Advised on PCP follow-up and return precautions.  Does are better with walking rather than worsen do not suspect claudication. I have reviewed the patients home medicines and have made adjustments as needed  Amount and/or Complexity of Data Reviewed Labs: ordered.  Risk Prescription drug management.           Final Clinical Impression(s) / ED Diagnoses Final diagnoses:  Bilateral leg pain    Rx / DC Orders ED Discharge Orders          Ordered    methocarbamol (ROBAXIN) 750 MG tablet  3 times daily,   Status:  Discontinued        02/03/23 2158    methocarbamol (ROBAXIN) 750 MG tablet  3 times daily        02/03/23 2202              Ma Rings, PA-C 02/04/23 0008    Loetta Rough, MD 02/04/23 0008

## 2023-02-04 DIAGNOSIS — M5416 Radiculopathy, lumbar region: Secondary | ICD-10-CM | POA: Diagnosis not present

## 2023-02-04 DIAGNOSIS — R140 Abdominal distension (gaseous): Secondary | ICD-10-CM | POA: Diagnosis not present

## 2023-02-04 DIAGNOSIS — M791 Myalgia, unspecified site: Secondary | ICD-10-CM | POA: Diagnosis not present

## 2023-02-07 ENCOUNTER — Telehealth: Payer: Self-pay

## 2023-02-07 NOTE — Telephone Encounter (Signed)
Pt does not follow office anymore due to insurance not being accepted- opened chart in error   Woodfin Ganja LPN [facility] Nurse Health Advisor Direct Dial [phone]

## 2023-02-09 DIAGNOSIS — F251 Schizoaffective disorder, depressive type: Secondary | ICD-10-CM | POA: Diagnosis not present

## 2023-02-11 DIAGNOSIS — L282 Other prurigo: Secondary | ICD-10-CM | POA: Diagnosis not present

## 2023-02-11 DIAGNOSIS — G4719 Other hypersomnia: Secondary | ICD-10-CM | POA: Diagnosis not present

## 2023-02-12 IMAGING — DX DG LUMBAR SPINE 2-3V
4 series · 4 of 4 positions shown · non-contrast
Comparison: None.

CLINICAL DATA: Low back pain

EXAM:
LUMBAR SPINE - 2-3 VIEW

[l-spine ap]
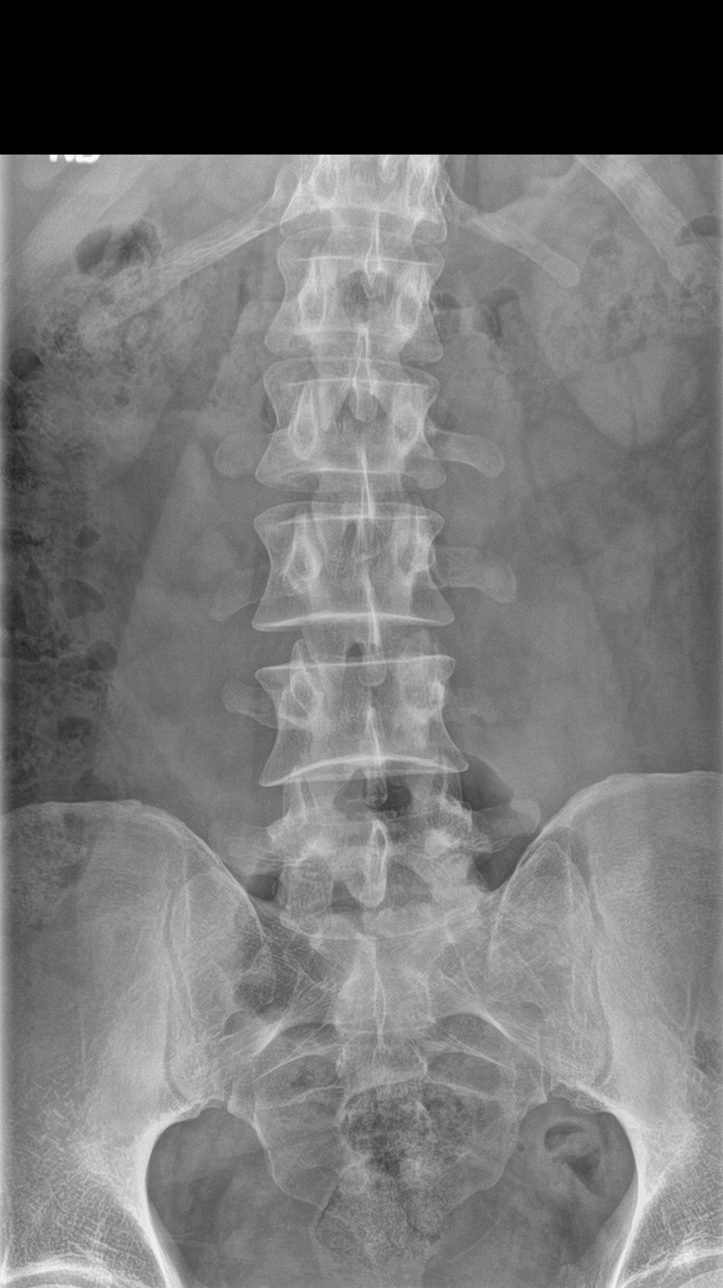

[l-spine lat]
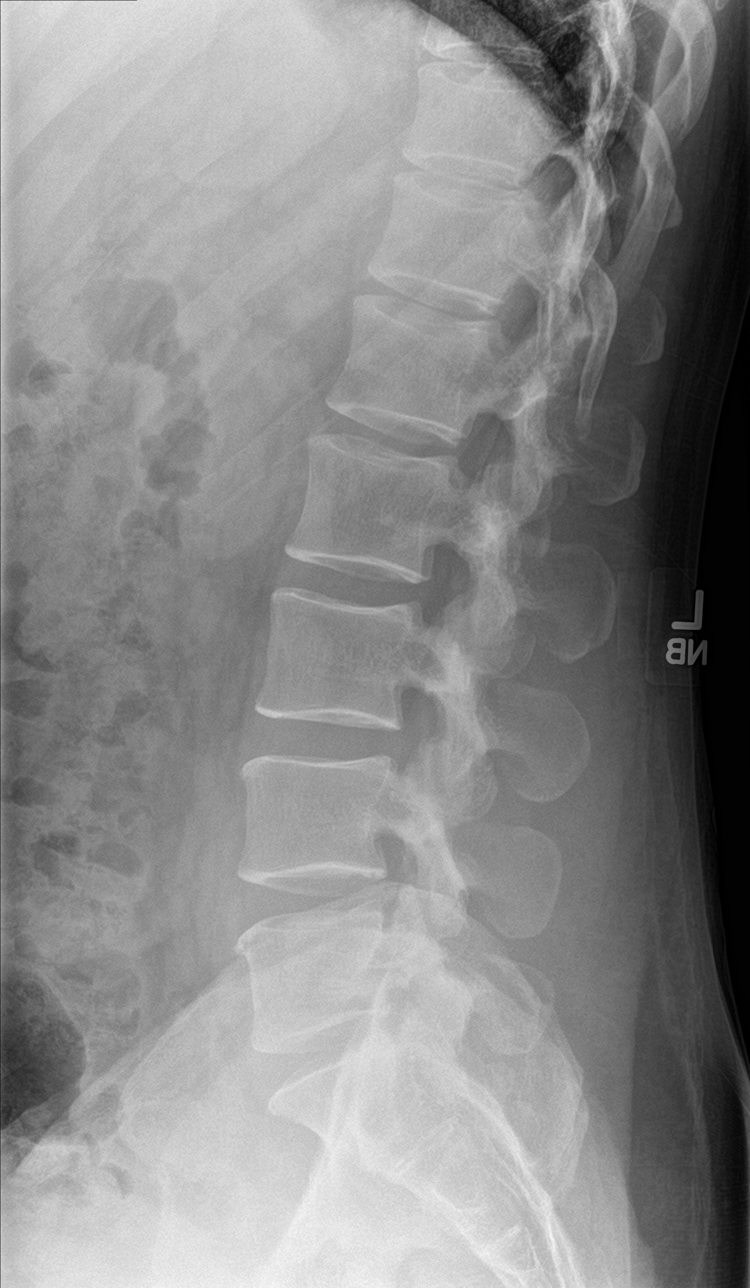

[l-spine spot (1 of 2)]
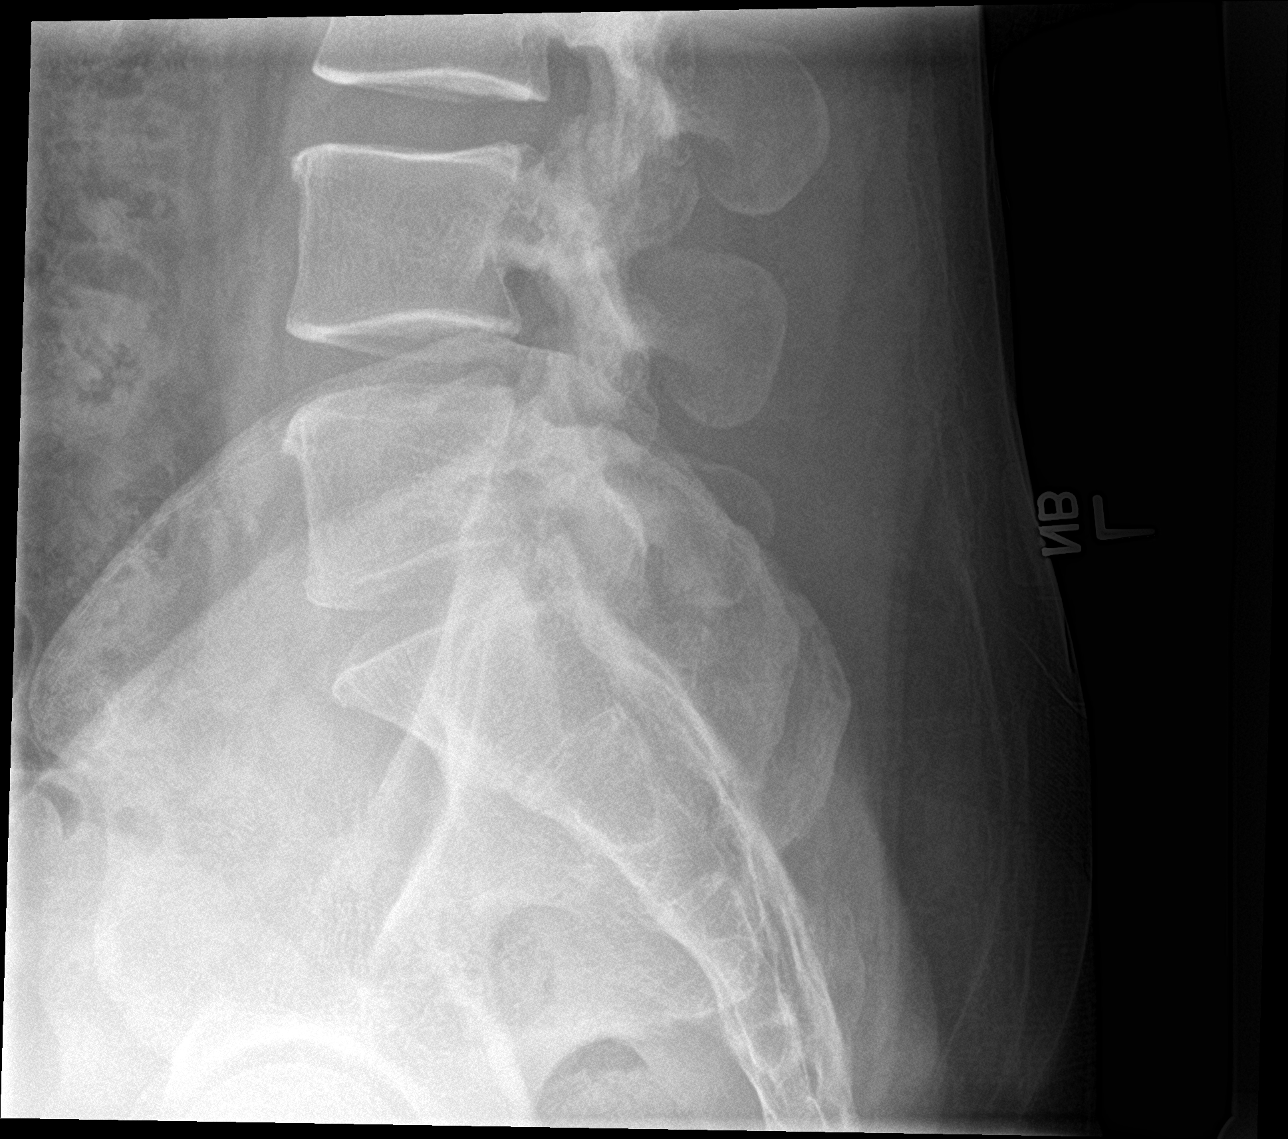

[l-spine spot (2 of 2)]
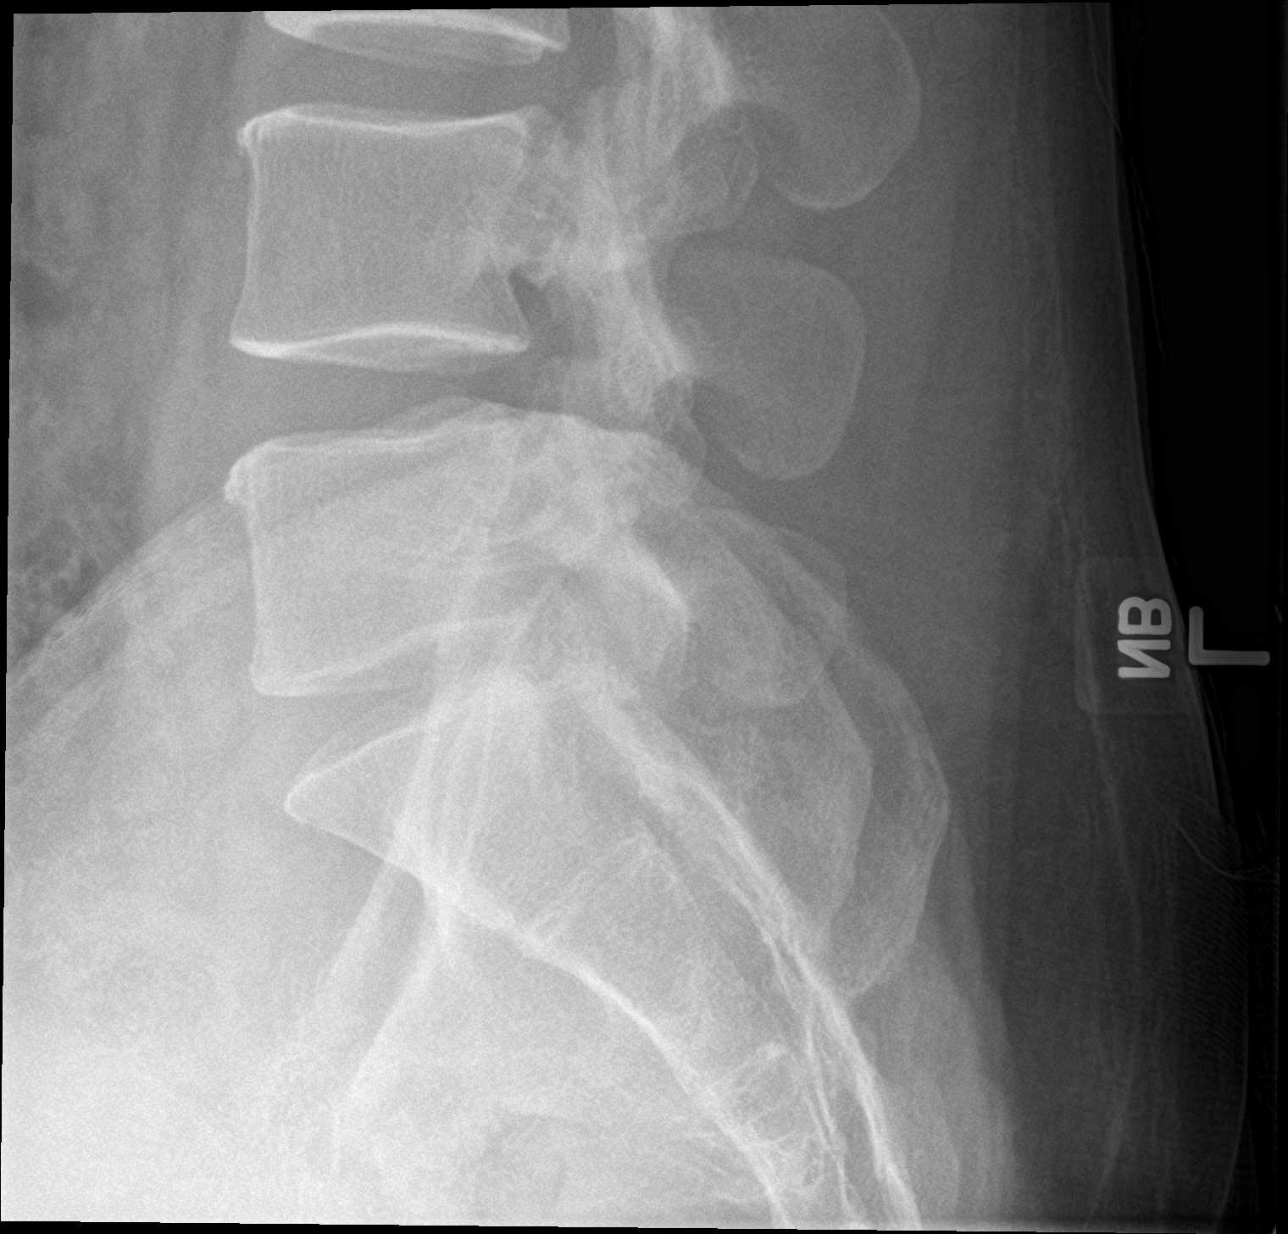

[4 of 4 positions shown; findings below may reference images not displayed]

FINDINGS: Five lumbar-type vertebral bodies.

Normal lumbar lordosis.

No evidence of fracture or dislocation. Vertebral body heights and
intervertebral disc spaces are maintained.

Visualized bony pelvis appears intact.
IMPRESSION: Negative.

## 2023-02-15 DIAGNOSIS — F251 Schizoaffective disorder, depressive type: Secondary | ICD-10-CM | POA: Diagnosis not present

## 2023-02-23 DIAGNOSIS — F251 Schizoaffective disorder, depressive type: Secondary | ICD-10-CM | POA: Diagnosis not present

## 2023-02-28 ENCOUNTER — Ambulatory Visit: Payer: BLUE CROSS/BLUE SHIELD | Admitting: Family Medicine

## 2023-03-02 DIAGNOSIS — F319 Bipolar disorder, unspecified: Secondary | ICD-10-CM | POA: Diagnosis not present

## 2023-03-02 DIAGNOSIS — F251 Schizoaffective disorder, depressive type: Secondary | ICD-10-CM | POA: Diagnosis not present

## 2023-03-14 DIAGNOSIS — F251 Schizoaffective disorder, depressive type: Secondary | ICD-10-CM | POA: Diagnosis not present

## 2023-06-29 DIAGNOSIS — M792 Neuralgia and neuritis, unspecified: Secondary | ICD-10-CM | POA: Diagnosis not present

## 2023-06-29 DIAGNOSIS — M545 Low back pain, unspecified: Secondary | ICD-10-CM | POA: Diagnosis not present

## 2023-06-30 ENCOUNTER — Other Ambulatory Visit: Payer: Self-pay | Admitting: Family Medicine

## 2023-06-30 DIAGNOSIS — K219 Gastro-esophageal reflux disease without esophagitis: Secondary | ICD-10-CM

## 2023-06-30 DIAGNOSIS — M79606 Pain in leg, unspecified: Secondary | ICD-10-CM

## 2023-07-01 ENCOUNTER — Ambulatory Visit: Payer: Medicaid Other | Admitting: Family Medicine

## 2023-07-01 DIAGNOSIS — F333 Major depressive disorder, recurrent, severe with psychotic symptoms: Secondary | ICD-10-CM | POA: Diagnosis not present

## 2023-07-01 MED ORDER — PANTOPRAZOLE SODIUM 40 MG PO TBEC
40.0000 mg | DELAYED_RELEASE_TABLET | Freq: Every day | ORAL | 2 refills | Status: DC
Start: 2023-07-01 — End: 2024-05-15

## 2023-07-14 ENCOUNTER — Ambulatory Visit: Payer: Self-pay | Admitting: Family Medicine

## 2023-07-14 ENCOUNTER — Ambulatory Visit
Admission: EM | Admit: 2023-07-14 | Discharge: 2023-07-14 | Disposition: A | Discharge status: Home / Self Care | Payer: Medicaid Other | Attending: Nurse Practitioner | Admitting: Nurse Practitioner

## 2023-07-14 DIAGNOSIS — R112 Nausea with vomiting, unspecified: Secondary | ICD-10-CM | POA: Diagnosis not present

## 2023-07-14 DIAGNOSIS — R52 Pain, unspecified: Secondary | ICD-10-CM

## 2023-07-14 DIAGNOSIS — R1084 Generalized abdominal pain: Secondary | ICD-10-CM

## 2023-07-14 LAB — POCT INFLUENZA A/B
Influenza A, POC: NEGATIVE
Influenza B, POC: NEGATIVE

## 2023-07-14 MED ORDER — ONDANSETRON 4 MG PO TBDP: 4.0000 mg | ORAL_TABLET | Freq: Three times a day (TID) | ORAL | 0 refills | Status: DC | PRN

## 2023-07-14 MED ORDER — ONDANSETRON 4 MG PO TBDP
4.0000 mg | ORAL_TABLET | Freq: Once | ORAL | Status: AC
  Administered 2023-07-14: 4 mg via ORAL

## 2023-07-14 MED ORDER — IBUPROFEN 800 MG PO TABS: 800.0000 mg | ORAL_TABLET | Freq: Three times a day (TID) | ORAL | 0 refills | Status: DC | PRN

## 2023-07-14 NOTE — ED Triage Notes (Signed)
Pt states vomiting this morning with body aches, headache  and chills.

## 2023-07-14 NOTE — Telephone Encounter (Signed)
Pt was seen with UC

## 2023-07-14 NOTE — Discharge Instructions (Signed)
You likely have a viral stomach bug as we discussed.  We gave you Zofran today to help with the nausea.  Continue Zofran at home every 8 hours as needed for nausea/vomiting.  Push plenty of hydration at home today.  Avoid foods that are heavy or greasy, he can eat clear liquids or really soft/easy to digest foods.  Recommend Tylenol or ibuprofen as needed for the body aches.  You tested negative for influenza today.  If symptoms worsen significantly or do not improve over the next couple of days, please seek emergent care.

## 2023-07-14 NOTE — Telephone Encounter (Signed)
  Chief Complaint: vomiting Symptoms: vomiting, diarrhea, fatigue/weakness, headache Frequency: Began this morning Pertinent Negatives: Patient denies blood in stool or emesis, fever, difficulty urinating Disposition: [] ED /[] Urgent Care (no appt availability in office) / [] Appointment(In office/virtual)/ []  [facility]/ [] Home Care/ [x] Refused Recommended Disposition /[] [facility]/ []  Follow-up with PCP Additional Notes: Pt called stating when he arrived to work this morning he had a cup of coffee and then began vomiting. Pt reports he had some abdominal pain at that time, but that has resolved and he still has some nausea. Pt states he had one episode of diarrhea this morning. States he has chills, feels dehydrated and weak. Pt reports normal fluid intake and being able to hold down fluids at this time. Per protocol, pt to be evaluated within 24 hours. Next available appt 07/15/23 @ 0800. Pt declined appt stating he needs to be seen today, he needs a note to return to work. Pt states he will go to [facility] in [place], offered to make appt for 1845, pt declined stating he will walk in. Care instructions reviewed, pt verbalized understanding denies further questions at this time. Alerting PCP for review.   Copied from CRM [id number]. Topic: Clinical - Pink Word Triage >> Jul 14, 2023 10:32 AM Alphonzo Lemmings O wrote: Reason for Triage: patient is calling not feeling well vomiting weakness headache . Didn't see appointment for today Reason for Disposition  [1] MILD or MODERATE vomiting AND [2] present > 48 hours (2 days) (Exception: Mild vomiting with associated diarrhea.)  Answer Assessment - Initial Assessment Questions 1. VOMITING SEVERITY: "How many times have you vomited in the past 24 hours?"     - MILD:  1 - 2 times/day    - MODERATE: 3 - 5 times/day, decreased oral intake without significant weight loss or symptoms of dehydration    - SEVERE: 6 or more times/day, vomits  everything or nearly everything, with significant weight loss, symptoms of dehydration      Moderate, 3 times. 2. ONSET: "When did the vomiting begin?"      This morning 3. FLUIDS: "What fluids or food have you vomited up today?" "Have you been able to keep any fluids down?"     Drank V8, been able to keep that down 4. ABDOMEN PAIN: "Are your having any abdomen pain?" If Yes : "How bad is it and what does it feel like?" (e.g., crampy, dull, intermittent, constant)      Was earlier prior to vomiting dull pain, now feels like nausea 5. DIARRHEA: "Is there any diarrhea?" If Yes, ask: "How many times today?"      One time this morning 6. CONTACTS: "Is there anyone else in the family with the same symptoms?"      Denies 7. CAUSE: "What do you think is causing your vomiting?"     Denies 8. HYDRATION STATUS: "Any signs of dehydration?" (e.g., dry mouth [not only dry lips], too weak to stand) "When did you last urinate?"     Feeling weak, dry lips. Urinating like normal 9. OTHER SYMPTOMS: "Do you have any other symptoms?" (e.g., fever, headache, vertigo, vomiting blood or coffee grounds, recent head injury)     Headache, weakness, chills  Protocols used: Vomiting-A-AH

## 2023-07-14 NOTE — ED Provider Notes (Signed)
[facility]    CSN: [id number] Arrival date & time: 07/14/23  1109      History   Chief Complaint Chief Complaint  Patient presents with   Emesis    HPI Jose Mitchell is a 42 y.o. male.   Patient presents to with bodyaches, chills, headache, abdominal cramping, and vomiting that began a couple of hours ago after he got to work today.  He denies any diarrhea.  Reports he has vomited 3 times today, however has been able to keep down fluids since vomiting.  He is currently nauseous.  No current abdominal pain, abdominal pain comes and goes.  He has been able to drink V8 juice and keep it down.  Patient denies recent foreign travel, recent antibiotic use recent ingestion of suspicious drinking water, and similar illness and known contacts.  Has not take anything for symptoms so far.    Past Medical History:  Diagnosis Date   Alcohol abuse    Bipolar 1 disorder (HCC)    Cocaine abuse (HCC)    Schizophrenia [facility])     Patient Active Problem List   Diagnosis Date Noted   Pre-operative clearance 12/03/2022   Sinusitis 10/28/2022   Gastroesophageal reflux disease 10/04/2022   HNP (herniated nucleus pulposus), lumbar 10/04/2022   Epigastric pain 01/27/2022   Anxiety reaction 12/18/2021   Chronic right shoulder pain 11/24/2021   Restless leg 04/08/2020   Bipolar 1 disorder, depressed (HCC) 04/08/2020   Bipolar 2 disorder, major depressive episode (HCC) 04/08/2020   Low back pain radiating down leg 02/29/2020    History reviewed. No pertinent surgical history.     Home Medications    Prior to Admission medications   Medication Sig Start Date End Date Taking? Authorizing Provider  ibuprofen (ADVIL) 800 MG tablet Take 1 tablet (800 mg total) by mouth every 8 (eight) hours as needed. Take with food to prevent GI upset 07/14/23  Yes Cathlean Marseilles A, NP  ondansetron (ZOFRAN-ODT) 4 MG disintegrating tablet Take 1 tablet (4 mg total) by mouth every 8 (eight)  hours as needed. 07/14/23  Yes Cathlean Marseilles A, NP  gabapentin (NEURONTIN) 300 MG capsule Take 1 capsule (300 mg total) by mouth 2 (two) times daily as needed. 11/09/22   [name], FNP  HYDROcodone-acetaminophen (NORCO/VICODIN) 5-325 MG tablet Take 1 tablet by mouth at bedtime as needed for severe pain. 11/09/22   [name], FNP  methocarbamol (ROBAXIN) 750 MG tablet Take 1 tablet (750 mg total) by mouth 3 (three) times daily. 02/03/23   Carmel Sacramento A, PA-C  omeprazole (PRILOSEC) 40 MG capsule Take 1 capsule (40 mg total) by mouth 2 (two) times daily. 10/04/22   [name], MD  pantoprazole (PROTONIX) 40 MG tablet Take 1 tablet (40 mg total) by mouth daily. 07/01/23 12/28/23  [name], FNP  sucralfate (CARAFATE) 1 g tablet Take 1 tablet (1 g total) by mouth 2 (two) times daily. 2 times daily before meals and at bedtime 10/15/22 11/14/22  Del Nigel Berthold, FNP    Family History Family History  Family history unknown: Yes    Social History Social History   Tobacco Use   Smoking status: Former    Current packs/day: 0.00    Types: Cigarettes    Quit date: 2021    Years since quitting: 3.9   Smokeless tobacco: Current    Types: Chew  Vaping Use   Vaping status: Never Used  Substance Use Topics   Alcohol use: Not Currently  Drug use: Not Currently    Types: Cocaine     Allergies   Septra [sulfamethoxazole-trimethoprim]   Review of Systems Review of Systems Per HPI  Physical Exam Triage Vital Signs ED Triage Vitals [07/14/23 1225]  Encounter Vitals Group     BP 116/87     Systolic BP Percentile      Diastolic BP Percentile      Pulse Rate (!) 114     Resp 16     Temp 99.5 F (37.5 C)     Temp Source Oral     SpO2 95 %     Weight      Height      Head Circumference      Peak Flow      Pain Score 5     Pain Loc      Pain Education      Exclude from Growth Chart    No data found.  Updated Vital Signs BP 116/87 (BP Location:  Right Arm)   Pulse (!) 114   Temp 99.5 F (37.5 C) (Oral)   Resp 16   SpO2 95%   Visual Acuity Right Eye Distance:   Left Eye Distance:   Bilateral Distance:    Right Eye Near:   Left Eye Near:    Bilateral Near:     Physical Exam Vitals and nursing note reviewed.  Constitutional:      General: He is not in acute distress.    Appearance: Normal appearance. He is not toxic-appearing.  HENT:     Head: Normocephalic and atraumatic.     Mouth/Throat:     Mouth: Mucous membranes are moist.     Pharynx: Oropharynx is clear. No posterior oropharyngeal erythema.  Cardiovascular:     Rate and Rhythm: Normal rate and regular rhythm.  Pulmonary:     Effort: Pulmonary effort is normal. No respiratory distress.     Breath sounds: Normal breath sounds. No wheezing, rhonchi or rales.  Abdominal:     General: Abdomen is flat. Bowel sounds are normal. There is no distension.     Palpations: Abdomen is soft.     Tenderness: There is no abdominal tenderness. There is no right CVA tenderness, left CVA tenderness, guarding or rebound.  Musculoskeletal:     Cervical back: Normal range of motion.  Lymphadenopathy:     Cervical: No cervical adenopathy.  Skin:    General: Skin is warm and dry.     Capillary Refill: Capillary refill takes less than 2 seconds.     Coloration: Skin is not jaundiced or pale.     Findings: No erythema.  Neurological:     Mental Status: He is alert and oriented to person, place, and time.     Motor: No weakness.     Gait: Gait normal.  Psychiatric:        Behavior: Behavior is cooperative.      UC Treatments / Results  Labs (all labs ordered are listed, but only abnormal results are displayed) Labs Reviewed  POCT INFLUENZA A/B - Normal    EKG   Radiology No results found.  Procedures Procedures (including critical care time)  Medications Ordered in UC Medications  ondansetron (ZOFRAN-ODT) disintegrating tablet 4 mg (4 mg Oral Given 07/14/23  1304)    Initial Impression / Assessment and Plan / UC Course  I have reviewed the triage vital signs and the nursing notes.  Pertinent labs & imaging results that were available during my care  of the patient were reviewed by me and considered in my medical decision making (see chart for details).   Patient is mildly tachycardic in triage, otherwise vital signs are stable.  1. Generalized abdominal pain 2. Nausea and vomiting, unspecified vomiting type 3. Body aches Influenza test negative Suspect viral gastroenteritis Treat with Zofran 4 mg ODT in urgent care today, start Zofran every 8 hours as needed at home Other supportive measures discussed included Tylenol/ibuprofen as needed for body aches Strict ER precautions discussed with patient Work excuse provided  The patient was given the opportunity to ask questions.  All questions answered to their satisfaction.  The patient is in agreement to this plan.    Final Clinical Impressions(s) / UC Diagnoses   Final diagnoses:  Generalized abdominal pain  Nausea and vomiting, unspecified vomiting type  Body aches     Discharge Instructions      You likely have a viral stomach bug as we discussed.  We gave you Zofran today to help with the nausea.  Continue Zofran at home every 8 hours as needed for nausea/vomiting.  Push plenty of hydration at home today.  Avoid foods that are heavy or greasy, he can eat clear liquids or really soft/easy to digest foods.  Recommend Tylenol or ibuprofen as needed for the body aches.  You tested negative for influenza today.  If symptoms worsen significantly or do not improve over the next couple of days, please seek emergent care.     ED Prescriptions     Medication Sig Dispense Auth. Provider   ibuprofen (ADVIL) 800 MG tablet Take 1 tablet (800 mg total) by mouth every 8 (eight) hours as needed. Take with food to prevent GI upset 30 tablet Cathlean Marseilles A, NP   ondansetron (ZOFRAN-ODT) 4  MG disintegrating tablet Take 1 tablet (4 mg total) by mouth every 8 (eight) hours as needed. 20 tablet [name], NP      PDMP not reviewed this encounter.   [name], NP 07/14/23 [phone]

## 2023-08-02 ENCOUNTER — Other Ambulatory Visit: Payer: Self-pay | Admitting: Licensed Clinical Social Worker

## 2023-08-02 NOTE — Patient Instructions (Signed)
 Visit Information  Jose Mitchell was given information about Medicaid Managed Care team care coordination services as a part of their Healthy [facility] Medicaid benefit. Jose Mitchell verbally consented to engagement with the [facility] Managed Care team.   If you are experiencing a medical emergency, please call 911 or report to your local emergency department or urgent care.   If you have a non-emergency medical problem during routine business hours, please contact your provider's office and ask to speak with a nurse.   For questions related to your Healthy [facility] health plan, please call: [phone] or visit the homepage here: [email or web address]  If you would like to schedule transportation through your Healthy [facility] plan, please call the following number at least 2 days in advance of your appointment: [phone]  For information about your ride after you set it up, call Ride Assist at [phone]. Use this number to activate a Will Call pickup, or if your transportation is late for a scheduled pickup. Use this number, too, if you need to make a change or cancel a previously scheduled reservation.  If you need transportation services right away, call [phone]. The after-hours call center is staffed 24 hours to handle ride assistance and urgent reservation requests (including discharges) 365 days a year. Urgent trips include sick visits, hospital discharge requests and life-sustaining treatment.  Call the [facility] Line at [phone], at any time, 24 hours a day, 7 days a week. If you are in danger or need immediate medical attention call 911.  If you would like help to quit smoking, call 1-800-QUIT-NOW ((850)498-8949) OR Espaol: 1-855-Djelo-Ya (8-144-664-6430) o para ms informacin haga clic aqu or Text READY to 799-599 to register via text  Following is a copy of your plan of care:  Care Plan : LCSW Plan of Care   Updates made by Jose Lyle CROME, LCSW since 08/02/2023 12:00 AM     Problem: Depression Identification (Depression)      Goal: Depressive Symptoms Identified   Start Date: 08/02/2023  Note:    Timeframe:  Short-Range Goal Priority:  High Start Date:   08/02/23           Expected End Date:  ongoing                     Follow Up Date--08/23/23 at 3 pm  - keep 90 percent of scheduled appointments -consider counseling or psychiatry -consider bumping up your self-care  -consider creating a stronger support network   Why is this important?             Combating depression may take some time.            If you don't feel better right away, don't give up on your treatment plan.    Current barriers:   Chronic Mental Health needs related to symptoms of bipolar, stress and anxiety management. Patient requires Support, Education, Resources, Referrals, Advocacy, and Care Coordination, in order to meet Unmet Mental Health Needs and to find a new therapist and psychiatrist. Patient will implement clinical interventions discussed today to decrease symptoms of depression and increase knowledge and/or ability of: coping skills. Mental Health Concerns and Social Isolation Patient lacks knowledge of available community counseling agencies and resources.  Clinical Goal(s): verbalize understanding of plan for management of Anxiety, Depression, and Stress symptoms and demonstrate a reduction in symptoms. Patient will connect with a provider for ongoing mental health treatment, increase coping skills, healthy habits, self-management skills, and stress  reduction       Patient Goals/Self-Care Activities: Take medications as prescribed   Attend all scheduled provider appointments Call pharmacy for medication refills 3-7 days in advance of running out of medications Perform all self care activities independently  Perform IADL's (shopping, preparing meals, housekeeping, managing finances) independently Call  provider office for new concerns or questions Work with the social worker to address care coordination needs and will continue to work with the clinical team to address health care and disease management related needs call 1-800-273-TALK (toll free, 24 hour hotline) If in a crisis, go to [facility] [address], [place] [phone]) call 911 if experiencing a Mental Health or Behavioral Health Crisis  Utilize healthy coping skills and supportive resources discussed Contact PCP with any questions or concerns Keep 90 percent of counseling appointments Call your insurance provider for more information about your Enhanced Benefits  Check out counseling resources provided  Accept all calls from representative with Behavioral Health Providers in an effort to establish ongoing mental health counseling and supportive services. Incorporate into daily practice - relaxation techniques, deep breathing exercises, and mindfulness meditation strategies. Talk about feelings with friends, family members, spiritual advisor, etc. Contact LCSW directly [phone]), if you have questions, need assistance, or if additional social work needs are identified between now and our next scheduled telephone outreach call. Call 988 for mental health hotline/crisis line if needed (24/7 available) Try techniques to reduce symptoms of anxiety/negative thinking (deep breathing, distraction, positive self talk, etc)  - develop a personal safety plan - develop a plan to deal with triggers like holidays, anniversaries - exercise at least 2 to 3 times per week - have a plan for how to handle bad days - journal feelings and what helps to feel better or worse - spend time or talk with others at least 2 to 3 times per week - watch for early signs of feeling worse - begin personal counseling - call and visit an old friend - check out volunteer opportunities - join a support group -  laugh; watch a funny movie or comedian - learn and use visualization or guided imagery - perform a random act of kindness - practice relaxation or meditation daily - start or continue a personal journal - practice positive thinking and self-talk -continue with compliance of taking medication  -identify current effective and ineffective coping strategies.  -implement positive self-talk in care to increase self-esteem, confidence and feelings of control.  -consider alternative and complementary therapy approaches such as meditation, mindfulness or yoga.  Call your insurance provider to gain education on benefits if desired Call your primary care doctor if symptoms get worse  -journaling, prayer, worship services, meditation or pastoral counseling.  -increase participation in pleasurable group activities such as hobbies, singing, sports or volunteering).  -consider the use of meditative movement therapy such as tai chi, yoga or qigong.  -start a regular daily exercise program based on tolerance, ability and patient choice to support positive thinking and activity    Follow Up Plan:  The patient has been provided with contact information for the care management team and has been advised to call with any mental health or health related questions or concerns.  The care management team will reach out to the patient again over the next 30 business  days.   If you are experiencing a Mental Health or Behavioral Health Crisis or need someone to talk to, please call the Suicide and Crisis Lifeline: [phone]  Patient Goals: Initial goal     24- Hour Availability:    [facility]  [address] [place], [place] [phone] Crisis [phone]   [facility] [phone]  [place] [facility]  [phone]    [facility]  [phone] (after hours)   Therapeutic Alternative/Mobile Crisis   [phone]    [place]  National Suicide Hotline  928-758-0358 Jose Mitchell) OR 988   Call [phone] for mental health emergencies   [facility]  [phone]);  [place] and [employer]  [phone]); [place], [place], [place], [place], [place], [place], [place]    [facility] for [facility] Residents For 24/7 walk-up access to mental health services for [facility] children (4+), adolescents and adults, please visit the [facility] located at [address] in [place], [place].  *Mitchell also provides comprehensive outpatient behavioral health services in a variety of locations around [place].  Connect With Us  [address] [place], [place] [address] HelpLine: (503)723-0270 or 1-825-229-8024  Get Directions  Find Help 24/7 By Phone Call our 24-hour HelpLine at 312-206-7089 or (520)638-3548 for immediate assistance for mental health and substance abuse issues.  Walk-In Help [place]: [facility] (Ages 4 and Up) [place]: Emergency Dept., [facility] Additional Resources National Hopeline Network: 1-800-SUICIDE The National Suicide Prevention Lifeline: 1-800-273-TALK     The following coping skill education was provided for stress relief and mental health management: When your car dies or a deadline looms, how do you respond? Long-term, low-grade or acute stress takes a serious toll on your body and mind, so don't ignore feelings of constant tension. Stress is a natural part of life. However, too much stress can harm our health, especially if it continues every day. This is chronic stress and can put you at risk for heart problems like heart disease and depression. Understand what's happening inside your body and learn simple coping skills to combat the negative impacts of everyday stressors.  Types of Stress There are two types of  stress: Emotional - types of emotional stress are relationship problems, pressure at work, financial worries, experiencing discrimination or having a major life change. Physical - Examples of physical stress include being sick having pain, not sleeping well, recovery from an injury or having an alcohol and drug use disorder. Fight or Flight Sudden or ongoing stress activates your nervous system and floods your bloodstream with adrenaline and cortisol, two hormones that raise blood pressure, increase heart rate and spike blood sugar. These changes pitch your body into a fight or flight response. That enabled our ancestors to outrun saber-toothed tigers, and it's helpful today for situations like dodging a car accident. But most modern chronic stressors, such as finances or a challenging relationship, keep your body in that heightened state, which hurts your health. Effects of Too Much Stress If constantly under stress, most of us  will eventually start to function less well.  Multiple studies link chronic stress to a higher risk of heart disease, stroke, depression, weight gain, memory loss and even premature death, so it's important to recognize the warning signals. Talk to your doctor about ways to manage stress if you're experiencing any of these symptoms: Prolonged periods of poor sleep. Regular, severe headaches. Unexplained weight loss or gain. Feelings of isolation, withdrawal or worthlessness. Constant anger and irritability. Loss of interest in activities. Constant worrying or obsessive  thinking. Excessive alcohol or drug use. Inability to concentrate.  10 Ways to Cope with Chronic Stress It's key to recognize stressful situations as they occur because it allows you to focus on managing how you react. We all need to know when to close our eyes and take a deep breath when we feel tension rising. Use these tips to prevent or reduce chronic stress. 1. Rebalance Work and Home All work and no  play? If you're spending too much time at the office, intentionally put more dates in your calendar to enjoy time for fun, either alone or with others. 2. Get Regular Exercise Moving your body on a regular basis balances the nervous system and increases blood circulation, helping to flush out stress hormones. Even a daily 20-minute walk makes a difference. Any kind of exercise can lower stress and improve your mood ? just pick activities that you enjoy and make it a regular habit. 3. Eat Well and Limit Alcohol and Stimulants Alcohol, nicotine  and caffeine may temporarily relieve stress but have negative health impacts and can make stress worse in the long run. Well-nourished bodies cope better, so start with a good breakfast, add more organic fruits and vegetables for a well-balanced diet, avoid processed foods and sugar, try herbal tea and drink more water. 4. Connect with Supportive People Talking face to face with another person releases hormones that reduce stress. Lean on those good listeners in your life. 5. Carve Out Hobby Time Do you enjoy gardening, reading, listening to music or some other creative pursuit? Engage in activities that bring you pleasure and joy; research shows that reduces stress by almost half and lowers your heart rate, too. 6. Practice Meditation, Stress Reduction or Yoga Relaxation techniques activate a state of restfulness that counterbalances your body's fight-or-flight hormones. Even if this also means a 10-minute break in a long day: listen to music, read, go for a walk in nature, do a hobby, take a bath or spend time with a friend. Also consider doing a mindfulness exercise or try a daily deep breathing or imagery practice. Deep Breathing Slow, calm and deep breathing can help you relax. Try these steps to focus on your breathing and repeat as needed. Find a comfortable position and close your eyes. Exhale and drop your shoulders. Breathe in through your nose; fill  your lungs and then your belly. Think of relaxing your body, quieting your mind and becoming calm and peaceful. Breathe out slowly through your nose, relaxing your belly. Think of releasing tension, pain, worries or distress. Repeat steps three and four until you feel relaxed. Imagery This involves using your mind to excite the senses -- sound, vision, smell, taste and feeling. This may help ease your stress. Begin by getting comfortable and then do some slow breathing. Imagine a place you love being at. It could be somewhere from your childhood, somewhere you vacationed or just a place in your imagination. Feel how it is to be in the place you're imagining. Pay attention to the sounds, air, colors, and who is there with you. This is a place where you feel cared for and loved. All is well. You are safe. Take in all the smells, sounds, tastes and feelings. As you do, feel your body being nourished and healed. Feel the calm that surrounds you. Breathe in all the good. Breathe out any discomfort or tension. 7. Sleep Enough If you get less than seven to eight hours of sleep, your body won't tolerate stress as well  as it could. If stress keeps you up at night, address the cause, and add extra meditation into your day to make up for the lost z's. Try to get seven to nine hours of sleep each night. Make a regular bedtime schedule. Keep your room dark and cool. Try to avoid computers, TV, cell phones and tablets before bed. 8. Bond with Connections You Enjoy Go out for a coffee with a friend, chat with a neighbor, call a family member, visit with a clergy member, or even hang out with your pet. Clinical studies show that spending even a short time with a companion animal can cut anxiety levels almost in half. 9. Take a Vacation Getting away from it all can reset your stress tolerance by increasing your mental and emotional outlook, which makes you a happier, more productive person upon return. Leave your  cellphone and laptop at home! 10. See a Counselor, Coach or Therapist If negative thoughts overwhelm your ability to make positive changes, it's time to seek professional help. Make an appointment today--your health and life are worth it.  Lyle Rung, BSW, MSW, LCSW Licensed Clinical Social Worker [employer]   [facility] [email or web address] Direct Dial: [phone]

## 2023-08-02 NOTE — Patient Outreach (Signed)
 Medicaid Managed Care Social Work Note  08/02/2023 Name:  Jose Mitchell MRN:  [id number] DOB:  24-Jun-1981  Jose Mitchell is an 43 y.o. year old male who is a primary patient of Zarwolo, Gloria, FNP.  The Medicaid Managed Care Coordination team was consulted for assistance with:  Mental Health Counseling and Resources  Jose Mitchell was given information about Medicaid Managed Care Coordination team services today. Franky Negri Patient agreed to services and verbal consent obtained.  Engaged with patient  for by telephone forinitial visit in response to referral for case management and/or care coordination services.   Patient is participating in a Managed Medicaid Plan:  Yes  Assessments/Interventions:  Review of past medical history, allergies, medications, health status, including review of consultants reports, laboratory and other test data, was performed as part of comprehensive evaluation and provision of chronic care management services.  SDOH: (Social Drivers of Health) assessments and interventions performed: SDOH Interventions    Flowsheet Row Patient Outreach Telephone from 08/02/2023 in [facility] Video Visit from 12/18/2021 in [facility]  SDOH Interventions    Food Insecurity Interventions Intervention Not Indicated  [PT IS ON FOOD STAMPS ALREADY] --  Housing Interventions Intervention Not Indicated --  Transportation Interventions Intervention Not Indicated --  Depression Interventions/Treatment  -- Medication  Financial Strain Interventions Community Resources Provided --  Stress Interventions Community Resources Provided, Provide Counseling --       Advanced Directives Status:  See Care Plan for related entries.  Care Plan                 Allergies  Allergen Reactions   Septra [Sulfamethoxazole-Trimethoprim]     Childhood allergy. Does not know reaction    Medications Reviewed Today   Medications were not  reviewed in this encounter     Patient Active Problem List   Diagnosis Date Noted   Pre-operative clearance 12/03/2022   Sinusitis 10/28/2022   Gastroesophageal reflux disease 10/04/2022   HNP (herniated nucleus pulposus), lumbar 10/04/2022   Epigastric pain 01/27/2022   Anxiety reaction 12/18/2021   Chronic right shoulder pain 11/24/2021   Restless leg 04/08/2020   Bipolar 1 disorder, depressed (HCC) 04/08/2020   Bipolar 2 disorder, major depressive episode (HCC) 04/08/2020   Low back pain radiating down leg 02/29/2020    Conditions to be addressed/monitored per PCP order:  Anxiety, Depression, and Bipolar Disorder  Care Plan : LCSW Plan of Care  Updates made by Merlynn Lyle CROME, LCSW since 08/02/2023 12:00 AM     Problem: Depression Identification (Depression)      Goal: Depressive Symptoms Identified   Start Date: 08/02/2023  Note:    Timeframe:  Short-Range Goal Priority:  High Start Date:   08/02/23           Expected End Date:  ongoing                     Follow Up Date--08/23/23 at 3 pm  - keep 90 percent of scheduled appointments -consider counseling or psychiatry -consider bumping up your self-care  -consider creating a stronger support network   Why is this important?             Combating depression may take some time.            If you don't feel better right away, don't give up on your treatment plan.    Current barriers:   Chronic Mental Health needs related to symptoms  of bipolar, stress and anxiety management. Patient requires Support, Education, Resources, Referrals, Advocacy, and Care Coordination, in order to meet Unmet Mental Health Needs and to find a new therapist and psychiatrist. Patient will implement clinical interventions discussed today to decrease symptoms of depression and increase knowledge and/or ability of: coping skills. Mental Health Concerns and Social Isolation Patient lacks knowledge of available community counseling agencies and  resources.  Clinical Goal(s): verbalize understanding of plan for management of Anxiety, Depression, and Stress symptoms and demonstrate a reduction in symptoms. Patient will connect with a provider for ongoing mental health treatment, increase coping skills, healthy habits, self-management skills, and stress reduction        Clinical Interventions:  Assessed patient's previous and current treatment, coping skills, support system and barriers to care. Patient provided hx  Verbalization of feelings encouraged, motivational interviewing employed Emotional support provided, positive coping strategies explored. Establishing healthy boundaries emphasized and healthy self-care education provided Patient was educated on available mental health resources within their area that accept Medicaid and offer counseling and psychiatry. Patient was advised to contact the back of his insurance card for assistance with benefits as well. Patient educated on the difference between therapy and psychiatry per patient request Email sent to patient today with available mental health resources within his area that accept Medicaid and offer the services that he is interested in. Email included instructions for scheduling at [facility] as well as some crisis support resources and [facility]'s walk in clinic hours. Patient will review resources over the next two weeks and make a decision regarding where he wishes to gain MH treatment at. Patient already goes to [facility] but wishes to transition to a new provider.  Emotional support provided. CBT intervention implemented regarding being mentally fit by combating negative thinking and replacing it with uplifting support, hope and positivity. Patient was successful in identifying triggers to anxiety and depression symptoms, in addition, to healthy coping skills.  Patient reports significant worsening anxiety and depression impacting his ability to function appropriately and carry out daily  task. Assessed social determinant of health barriers Patient receives strong support from mother LCSW provided education on relaxation techniques such as meditation, deep breathing, massage, grounding exercises or yoga that can activate the body's relaxation response and ease symptoms of stress and anxiety. LCSW ask that when pt is struggling with difficult emotions and racing thoughts that they start this relaxation response process. LCSW provided extensive education on healthy coping skills for anxiety. SW used active and reflective listening, validated patient's feelings/concerns, and provided emotional support. Patient will work on implementing appropriate self-care habits into their daily routine such as: staying positive, writing a gratitude list, drinking water, staying active around the house, taking their medications as prescribed, combating negative thoughts or emotions and staying connected with their family and friends. Positive reinforcement provided for this decision to work on this.  Motivational Interviewing employed Depression screen reviewed  PHQ2/ PHQ9 completed or reviewed  Mindfulness or Relaxation training provided Active listening / Reflection utilized  Advance Care and HCPOA education provided Emotional Support Provided Problem Solving /Task Center strategies reviewed Provided psychoeducation for mental health needs  Provided brief CBT  Reviewed mental health medications and discussed importance of compliance:  Quality of sleep assessed & Sleep Hygiene techniques promoted  Participation in counseling encouraged  Verbalization of feelings encouraged  Suicidal Ideation/Homicidal Ideation assessed: Patient denies SI/HI but is aware of [facility] walk in clinic hours, 988 hotline and [facility] Crisis Hotline Review resources, discussed options and  provided patient information about  Mental Health Resources Inter-disciplinary care team collaboration (see longitudinal plan of  care)  Patient Goals/Self-Care Activities: Take medications as prescribed   Attend all scheduled provider appointments Call pharmacy for medication refills 3-7 days in advance of running out of medications Perform all self care activities independently  Perform IADL's (shopping, preparing meals, housekeeping, managing finances) independently Call provider office for new concerns or questions Work with the social worker to address care coordination needs and will continue to work with the clinical team to address health care and disease management related needs call 1-800-273-TALK (toll free, 24 hour hotline) If in a crisis, go to [facility] [address], [place] [phone]) call 911 if experiencing a Mental Health or Behavioral Health Crisis  Utilize healthy coping skills and supportive resources discussed Contact PCP with any questions or concerns Keep 90 percent of counseling appointments Call your insurance provider for more information about your Enhanced Benefits  Check out counseling resources provided  Accept all calls from representative with Behavioral Health Providers in an effort to establish ongoing mental health counseling and supportive services. Incorporate into daily practice - relaxation techniques, deep breathing exercises, and mindfulness meditation strategies. Talk about feelings with friends, family members, spiritual advisor, etc. Contact LCSW directly [phone]), if you have questions, need assistance, or if additional social work needs are identified between now and our next scheduled telephone outreach call. Call 988 for mental health hotline/crisis line if needed (24/7 available) Try techniques to reduce symptoms of anxiety/negative thinking (deep breathing, distraction, positive self talk, etc)  - develop a personal safety plan - develop a plan to deal with triggers like holidays, anniversaries - exercise at least  2 to 3 times per week - have a plan for how to handle bad days - journal feelings and what helps to feel better or worse - spend time or talk with others at least 2 to 3 times per week - watch for early signs of feeling worse - begin personal counseling - call and visit an old friend - check out volunteer opportunities - join a support group - laugh; watch a funny movie or comedian - learn and use visualization or guided imagery - perform a random act of kindness - practice relaxation or meditation daily - start or continue a personal journal - practice positive thinking and self-talk -continue with compliance of taking medication  -identify current effective and ineffective coping strategies.  -implement positive self-talk in care to increase self-esteem, confidence and feelings of control.  -consider alternative and complementary therapy approaches such as meditation, mindfulness or yoga.  Call your insurance provider to gain education on benefits if desired Call your primary care doctor if symptoms get worse  -journaling, prayer, worship services, meditation or pastoral counseling.  -increase participation in pleasurable group activities such as hobbies, singing, sports or volunteering).  -consider the use of meditative movement therapy such as tai chi, yoga or qigong.  -start a regular daily exercise program based on tolerance, ability and patient choice to support positive thinking and activity    Follow Up Plan:  The patient has been provided with contact information for the care management team and has been advised to call with any mental health or health related questions or concerns.  The care management team will reach out to the patient again over the next 30 business  days.   If you are experiencing a Mental Health or Behavioral Health Crisis or need someone to  talk to, please call the Suicide and Crisis Lifeline: 988    Patient Goals: Initial goal      Follow up:   Patient agrees to Care Plan and Follow-up.  Plan: The Managed Medicaid care management team will reach out to the patient again over the next 30 days.  Lyle Rung, BSW, MSW, LCSW Licensed Clinical Social Worker [employer]   [email or web address] Direct Dial: [phone]

## 2023-08-03 ENCOUNTER — Telehealth: Payer: Medicaid Other | Admitting: Nurse Practitioner

## 2023-08-03 DIAGNOSIS — F419 Anxiety disorder, unspecified: Secondary | ICD-10-CM | POA: Diagnosis not present

## 2023-08-03 DIAGNOSIS — G4709 Other insomnia: Secondary | ICD-10-CM | POA: Diagnosis not present

## 2023-08-03 MED ORDER — HYDROXYZINE PAMOATE 25 MG PO CAPS: 25.0000 mg | ORAL_CAPSULE | Freq: Every evening | ORAL | 1 refills | Status: DC | PRN

## 2023-08-03 NOTE — Progress Notes (Signed)
 Virtual Visit Consent   Jose Mitchell, you are scheduled for a virtual visit with a [facility] provider today. Just as with appointments in the office, your consent must be obtained to participate. Your consent will be active for this visit and any virtual visit you may have with one of our providers in the next 365 days. If you have a MyChart account, a copy of this consent can be sent to you electronically.  As this is a virtual visit, video technology does not allow for your provider to perform a traditional examination. This may limit your provider's ability to fully assess your condition. If your provider identifies any concerns that need to be evaluated in person or the need to arrange testing (such as labs, EKG, etc.), we will make arrangements to do so. Although advances in technology are sophisticated, we cannot ensure that it will always work on either your end or our end. If the connection with a video visit is poor, the visit may have to be switched to a telephone visit. With either a video or telephone visit, we are not always able to ensure that we have a secure connection.  By engaging in this virtual visit, you consent to the provision of healthcare and authorize for your insurance to be billed (if applicable) for the services provided during this visit. Depending on your insurance coverage, you may receive a charge related to this service.  I need to obtain your verbal consent now. Are you willing to proceed with your visit today? Jose Mitchell has provided verbal consent on 08/03/2023 for a virtual visit (video or telephone). Lauraine Kitty, FNP  Date: 08/03/2023 6:25 PM  Virtual Visit via Video Note   I, Lauraine Kitty, connected with  Jose Mitchell  ([id number], 21-Mar-1981) on 08/03/23 at  6:30 PM EST by a video-enabled telemedicine application and verified that I am speaking with the correct person using two identifiers.  Location: Patient: Virtual Visit Location Patient:  Home Provider: Virtual Visit Location Provider: Home Office   I discussed the limitations of evaluation and management by telemedicine and the availability of in person appointments. The patient expressed understanding and agreed to proceed.    History of Present Illness: Jose Mitchell is a 43 y.o. who identifies as a male who was assigned male at birth, and is being seen today for difficulty sleeping   This has been going on for the past 6 months  He has difficulty falling asleep and staying asleep   Denies a known history of sleep apnea or testing in the past   He takes benadryl  to help with sleep but feels it is not working anymore   He does feel anxious at night  He does see a psychiatrist for depression   Problems:  Patient Active Problem List   Diagnosis Date Noted   Pre-operative clearance 12/03/2022   Sinusitis 10/28/2022   Gastroesophageal reflux disease 10/04/2022   HNP (herniated nucleus pulposus), lumbar 10/04/2022   Epigastric pain 01/27/2022   Anxiety reaction 12/18/2021   Chronic right shoulder pain 11/24/2021   Restless leg 04/08/2020   Bipolar 1 disorder, depressed (HCC) 04/08/2020   Bipolar 2 disorder, major depressive episode (HCC) 04/08/2020   Low back pain radiating down leg 02/29/2020    Allergies:  Allergies  Allergen Reactions   Septra [Sulfamethoxazole-Trimethoprim]     Childhood allergy. Does not know reaction   Medications:   Current Outpatient Medications:    PRAZOSIN  HCL PO, , Disp: , Rfl:  REXULTI 1 MG TABS tablet, , Disp: , Rfl:    Vitamin D , Ergocalciferol , (DRISDOL ) 1.25 MG (50000 UNIT) CAPS capsule, Take by mouth., Disp: , Rfl:    gabapentin  (NEURONTIN ) 300 MG capsule, Take 1 capsule (300 mg total) by mouth 2 (two) times daily as needed., Disp: 60 capsule, Rfl: 1   HYDROcodone -acetaminophen  (NORCO/VICODIN) 5-325 MG tablet, Take 1 tablet by mouth at bedtime as needed for severe pain., Disp: 20 tablet, Rfl: 0   ibuprofen  (ADVIL ) 800 MG  tablet, Take 1 tablet (800 mg total) by mouth every 8 (eight) hours as needed. Take with food to prevent GI upset, Disp: 30 tablet, Rfl: 0   methocarbamol  (ROBAXIN ) 750 MG tablet, Take 1 tablet (750 mg total) by mouth 3 (three) times daily., Disp: 15 tablet, Rfl: 0   omeprazole  (PRILOSEC) 40 MG capsule, Take 1 capsule (40 mg total) by mouth 2 (two) times daily., Disp: 180 capsule, Rfl: 1   ondansetron  (ZOFRAN -ODT) 4 MG disintegrating tablet, Take 1 tablet (4 mg total) by mouth every 8 (eight) hours as needed., Disp: 20 tablet, Rfl: 0   pantoprazole  (PROTONIX ) 40 MG tablet, Take 1 tablet (40 mg total) by mouth daily., Disp: 60 tablet, Rfl: 2   sucralfate  (CARAFATE ) 1 g tablet, Take 1 tablet (1 g total) by mouth 2 (two) times daily. 2 times daily before meals and at bedtime, Disp: 60 tablet, Rfl: 0   Observations/Objective: Patient is well-developed, well-nourished in no acute distress.  Resting comfortably  at home.  Head is normocephalic, atraumatic.  No labored breathing.  Speech is clear and coherent with logical content.  Patient is alert and oriented at baseline.    Assessment and Plan:  Advised follow up with psychiatry to discuss nighttime anxiety and insomnia management   Stop Benadryl  and replace with hydroxyzine  as needed  Discussed sleep hygiene   1. Anxiety with sleep   - hydrOXYzine  (VISTARIL ) 25 MG capsule; Take 1 capsule (25 mg total) by mouth at bedtime as needed for anxiety (sleep).  Dispense: 30 capsule; Refill: 1  2. Other insomnia  - hydrOXYzine  (VISTARIL ) 25 MG capsule; Take 1 capsule (25 mg total) by mouth at bedtime as needed for anxiety (sleep).  Dispense: 30 capsule; Refill: 1    Follow Up Instructions: I discussed the assessment and treatment plan with the patient. The patient was provided an opportunity to ask questions and all were answered. The patient agreed with the plan and demonstrated an understanding of the instructions.  A copy of instructions were  sent to the patient via MyChart unless otherwise noted below.    The patient was advised to call back or seek an in-person evaluation if the symptoms worsen or if the condition fails to improve as anticipated.    Lauraine Kitty, FNP

## 2023-08-12 ENCOUNTER — Ambulatory Visit: Payer: Medicaid Other | Admitting: Family Medicine

## 2023-08-15 ENCOUNTER — Other Ambulatory Visit: Payer: Self-pay | Admitting: Family Medicine

## 2023-08-16 ENCOUNTER — Other Ambulatory Visit: Payer: Self-pay | Admitting: Family Medicine

## 2023-08-17 ENCOUNTER — Telehealth: Payer: Self-pay | Admitting: Family Medicine

## 2023-08-17 NOTE — Telephone Encounter (Signed)
Copied from CRM [id number]. Topic: General - Other >> Aug 17, 2023  4:53 PM Geneva B wrote: Reason for CRM: patient has not been able to sleep he has an appointment 08/19/2023 he wanted to know if he could get something to help him sleep until then please call pt back [phone]

## 2023-08-18 NOTE — Patient Instructions (Signed)

## 2023-08-18 NOTE — Telephone Encounter (Signed)
Pt. Scheduled for office visit tomorrow.

## 2023-08-18 NOTE — Progress Notes (Signed)
Established Patient Office Visit   Subjective  Patient ID: Jose Mitchell, male    DOB: 02-Sep-1980  Age: 43 y.o. MRN: [id number]  Chief Complaint  Patient presents with   Medical Management of Chronic Issues    Anxiety/ having trouble sleeping.x 3 mo Was prescribed Hydroxyzine by UC and it is not strong enough Back pain getting worse.     He  has a past medical history of Alcohol abuse, Bipolar 1 disorder (HCC), Cocaine abuse (HCC), and Schizophrenia (HCC).  Patient presents with insomnia characterized by fragmented sleep, difficulty falling asleep, frequent awakenings, and persistent feelings of malaise and fatigue. These symptoms occur nightly and have remained unchanged over time. Aggravating factors include anxiety and caffeine consumption, with a daily intake of 2-3 caffeinated beverages, such as coffee and soda. Relief measures include using white noise, a nightlight, or having the TV on while sleeping. Previous treatments, including melatonin and hydroxyzine (100 mg), provided no significant relief. The patient's typical bedtime is between 6:30 pm waking around 3:30 AM to begin work at 5:30 AM. Relevant medical history includes family stress and anxiety, and no prior diagnostic evaluation has been conducted    Review of Systems  Constitutional:  Negative for chills and fever.  Respiratory:  Negative for shortness of breath.   Cardiovascular:  Negative for chest pain.  Neurological:  Negative for dizziness.  Psychiatric/Behavioral:  The patient has insomnia.       Objective:     BP 134/84   Pulse 78   Ht 6\' 2"  (1.88 m)   Wt 237 lb 0.6 oz (107.5 kg)   SpO2 96%   BMI 30.43 kg/m  BP Readings from Last 3 Encounters:  08/19/23 134/84  07/14/23 116/87  02/03/23 (!) 133/93      Physical Exam Vitals reviewed.  Constitutional:      General: He is not in acute distress.    Appearance: Normal appearance. He is not ill-appearing, toxic-appearing or diaphoretic.  HENT:      Head: Normocephalic.  Eyes:     General:        Right eye: No discharge.        Left eye: No discharge.     Conjunctiva/sclera: Conjunctivae normal.  Cardiovascular:     Pulses: Normal pulses.     Heart sounds: Normal heart sounds.  Pulmonary:     Effort: Pulmonary effort is normal. No respiratory distress.     Breath sounds: Normal breath sounds.  Skin:    General: Skin is warm and dry.     Capillary Refill: Capillary refill takes less than 2 seconds.  Neurological:     Mental Status: He is alert.  Psychiatric:        Mood and Affect: Mood normal.      No results found for any visits on 08/19/23.  The 10-year ASCVD risk score (Arnett DK, et al., 2019) is: 1.8%    Assessment & Plan:  Insomnia, unspecified type Assessment & Plan: Trial on Trazodone 50 mg at bedtime Explained to go to bed at the same time each night and get up at the same time each morning, including on the weekends. Make sure your bedroom is quiet, dark, relaxing, and at a comfortable temperature. Remove electronic devices, such as TVs, computers, and smart phones, from the bedroom.    Other orders -     traZODone HCl; Take 1 tablet (50 mg total) by mouth at bedtime.  Dispense: 30 tablet; Refill: 3    Return  if symptoms worsen or fail to improve.   Cruzita Lederer Newman Nip, FNP

## 2023-08-19 ENCOUNTER — Ambulatory Visit: Payer: Medicaid Other | Admitting: Family Medicine

## 2023-08-19 VITALS — BP 134/84 | HR 78 | Ht 74.0 in | Wt 237.0 lb

## 2023-08-19 DIAGNOSIS — G4700 Insomnia, unspecified: Secondary | ICD-10-CM | POA: Diagnosis not present

## 2023-08-19 MED ORDER — TRAZODONE HCL 50 MG PO TABS: 50.0000 mg | ORAL_TABLET | Freq: Every day | ORAL | 3 refills | Status: DC

## 2023-08-19 NOTE — Assessment & Plan Note (Signed)
Trial on Trazodone 50 mg at bedtime Explained to go to bed at the same time each night and get up at the same time each morning, including on the weekends. Make sure your bedroom is quiet, dark, relaxing, and at a comfortable temperature. Remove electronic devices, such as TVs, computers, and smart phones, from the bedroom.

## 2023-08-23 ENCOUNTER — Other Ambulatory Visit: Payer: Self-pay | Admitting: Licensed Clinical Social Worker

## 2023-08-23 DIAGNOSIS — F319 Bipolar disorder, unspecified: Secondary | ICD-10-CM

## 2023-08-23 DIAGNOSIS — F4310 Post-traumatic stress disorder, unspecified: Secondary | ICD-10-CM

## 2023-08-23 DIAGNOSIS — F251 Schizoaffective disorder, depressive type: Secondary | ICD-10-CM

## 2023-08-23 DIAGNOSIS — F411 Generalized anxiety disorder: Secondary | ICD-10-CM

## 2023-08-23 DIAGNOSIS — G4700 Insomnia, unspecified: Secondary | ICD-10-CM

## 2023-08-23 NOTE — Patient Outreach (Signed)
Medicaid Managed Care Social Work Note  08/23/2023 Name:  Jose Mitchell MRN:  [id number] DOB:  1981/04/17  Jose Mitchell is an 43 y.o. year old male who is a primary patient of [name], FNP.  The Medicaid Managed Care Coordination team was consulted for assistance with:  Mental Health Counseling and Resources  Mr. Cowdrey was given information about Medicaid Managed Care Coordination team services today. [name] agreed to services and verbal consent obtained.  Engaged with patient  for by telephone forfollow up visit in response to referral for case management and/or care coordination services.   Patient is participating in a Managed Medicaid Plan:  Yes  Assessments/Interventions:  Review of past medical history, allergies, medications, health status, including review of consultants reports, laboratory and other test data, was performed as part of comprehensive evaluation and provision of chronic care management services.  SDOH: (Social Drivers of Health) assessments and interventions performed: SDOH Interventions    Flowsheet Row Patient Outreach Telephone from 08/02/2023 in [facility] Video Visit from 12/18/2021 in [facility]  SDOH Interventions    Food Insecurity Interventions Intervention Not Indicated  [PT IS ON FOOD STAMPS ALREADY] --  Housing Interventions Intervention Not Indicated --  Transportation Interventions Intervention Not Indicated --  Depression Interventions/Treatment  -- Medication  Financial Strain Interventions Community Resources Provided --  Stress Interventions Community Resources Provided, Provide Counseling --       Advanced Directives Status:  See Care Plan for related entries.  Care Plan                 Allergies  Allergen Reactions   Sulfamethoxazole    Trimethoprim Nausea And Vomiting   Sulfamethoxazole-Trimethoprim Rash and Other (See Comments)    Childhood allergy.  Does not know reaction    Medications Reviewed Today     Reviewed by [name], LCSW (Social Worker) on 08/23/23 at 1624  Med List Status: <None>   Medication Order Taking? Sig Documenting Provider Last Dose Status Informant  gabapentin (NEURONTIN) 300 MG capsule [id number] No Take 1 capsule (300 mg total) by mouth 2 (two) times daily as needed. [name], FNP Taking Active   HYDROcodone-acetaminophen (NORCO/VICODIN) 5-325 MG tablet [id number] No Take 1 tablet by mouth at bedtime as needed for severe pain. [name], FNP Taking Active   hydrOXYzine (VISTARIL) 25 MG capsule [id number]  Take 1 capsule (25 mg total) by mouth at bedtime as needed for anxiety (sleep). [name], FNP  Active   ibuprofen (ADVIL) 800 MG tablet [id number]  Take 1 tablet (800 mg total) by mouth every 8 (eight) hours as needed. Take with food to prevent GI upset [name], NP  Active   omeprazole (PRILOSEC) 40 MG capsule [id number] No Take 1 capsule (40 mg total) by mouth 2 (two) times daily. [name], MD Taking Active   pantoprazole (PROTONIX) 40 MG tablet [id number]  Take 1 tablet (40 mg total) by mouth daily. [name], FNP  Active   PRAZOSIN HCL PO [id number]   [provider]  Active   REXULTI 1 MG TABS tablet [id number]   [provider]  Active   traZODone (DESYREL) 50 MG tablet [id number]  Take 1 tablet (50 mg total) by mouth at bedtime. [name], Tenna Child, FNP  Active   Vitamin D, Ergocalciferol, (DRISDOL) 1.25 MG (50000 UNIT) CAPS capsule [id number]  Take by mouth. [provider]  Active  Patient Active Problem List   Diagnosis Date Noted   Insomnia 08/19/2023   Schizoaffective disorder, depressive type (HCC) 01/28/2023   Lumbar stenosis with neurogenic claudication 12/08/2022   Pre-operative clearance 12/03/2022   Hemorrhoids 12/01/2022   Hx of adenomatous colonic polyps 12/01/2022   GERD without esophagitis 12/01/2022    Hiatal hernia 12/01/2022   Sinusitis 10/28/2022   Gastroesophageal reflux disease 10/04/2022   HNP (herniated nucleus pulposus), lumbar 10/04/2022   Epigastric pain 01/27/2022   Anxiety reaction 12/18/2021   Chronic right shoulder pain 11/24/2021   Schizophrenia, paranoid type (HCC) 06/06/2020   Restless leg 04/08/2020   Bipolar 1 disorder, depressed (HCC) 04/08/2020   Bipolar 2 disorder, major depressive episode (HCC) 04/08/2020   Low back pain radiating down leg 02/29/2020   PTSD (post-traumatic stress disorder) 05/03/2019    Conditions to be addressed/monitored per PCP order:  Bipolar Disorder  Care Plan : LCSW Plan of Care  Updates made by [name], LCSW since 08/23/2023 12:00 AM     Problem: Depression Identification (Depression)      Goal: Depressive Symptoms Identified   Start Date: 08/02/2023  Note:    Timeframe:  Short-Range Goal Priority:  High Start Date:   08/02/23           Expected End Date:  ongoing                     Follow Up Date--09/14/23 at 1 pm  - keep 90 percent of scheduled appointments -consider counseling or psychiatry -consider bumping up your self-care  -consider creating a stronger support network   Why is this important?             Combating depression may take some time.            If you don't feel better right away, don't give up on your treatment plan.    Current barriers:   Chronic Mental Health needs related to symptoms of bipolar, stress and anxiety management. Patient requires Support, Education, Resources, Referrals, Advocacy, and Care Coordination, in order to meet Unmet Mental Health Needs and to find a new therapist and psychiatrist. Patient will implement clinical interventions discussed today to decrease symptoms of depression and increase knowledge and/or ability of: coping skills. Mental Health Concerns and Social Isolation Patient lacks knowledge of available community counseling agencies and resources.  Clinical  Goal(s): verbalize understanding of plan for management of Anxiety, Depression, and Stress symptoms and demonstrate a reduction in symptoms. Patient will connect with a provider for ongoing mental health treatment, increase coping skills, healthy habits, self-management skills, and stress reduction        Clinical Interventions:  Assessed patient's previous and current treatment, coping skills, support system and barriers to care. Patient provided hx  Verbalization of feelings encouraged, motivational interviewing employed Emotional support provided, positive coping strategies explored. Establishing healthy boundaries emphasized and healthy self-care education provided Patient was educated on available mental health resources within their area that accept Medicaid and offer counseling and psychiatry. Patient was advised to contact the back of his insurance card for assistance with benefits as well. Patient educated on the difference between therapy and psychiatry per patient request Email sent to patient today with available mental health resources within his area that accept Medicaid and offer the services that he is interested in. Email included instructions for scheduling at [facility] as well as some crisis support resources and [facility]'s walk in clinic hours. Patient will review resources over the  next two weeks and make a decision regarding where he wishes to gain MH treatment at. Patient already goes to [facility] but wishes to transition to a new provider.  Emotional support provided. CBT intervention implemented regarding "being mentally fit" by combating negative thinking and replacing it with uplifting support, hope and positivity. Patient was successful in identifying triggers to anxiety and depression symptoms, in addition, to healthy coping skills.  Patient reports significant worsening anxiety and depression impacting his ability to function appropriately and carry out daily task. Assessed social  determinant of health barriers Patient receives strong support from mother LCSW provided education on relaxation techniques such as meditation, deep breathing, massage, grounding exercises or yoga that can activate the body's relaxation response and ease symptoms of stress and anxiety. LCSW ask that when pt is struggling with difficult emotions and racing thoughts that they start this relaxation response process. LCSW provided extensive education on healthy coping skills for anxiety. SW used active and reflective listening, validated patient's feelings/concerns, and provided emotional support. Patient will work on implementing appropriate self-care habits into their daily routine such as: staying positive, writing a gratitude list, drinking water, staying active around the house, taking their medications as prescribed, combating negative thoughts or emotions and staying connected with their family and friends. Positive reinforcement provided for this decision to work on this.  Motivational Interviewing employed Depression screen reviewed  PHQ2/ PHQ9 completed or reviewed  Mindfulness or Relaxation training provided Active listening / Reflection utilized  Advance Care and HCPOA education provided Emotional Support Provided Problem Solving /Task Center strategies reviewed Provided psychoeducation for mental health needs  Provided brief CBT  Reviewed mental health medications and discussed importance of compliance:  Quality of sleep assessed & Sleep Hygiene techniques promoted  Participation in counseling encouraged  Verbalization of feelings encouraged  Suicidal Ideation/Homicidal Ideation assessed: Patient denies SI/HI but is aware of [facility] walk in clinic hours, 988 hotline and [facility] Crisis Hotline Review resources, discussed options and provided patient information about  Mental Health Resources Inter-disciplinary care team collaboration (see longitudinal plan of care) Patient recently  saw PCP for insomnia and was prescribed Trazodone. He reports that he picked up this medication already. Patient reports that he has made a decision regarding where he wishes to gain therapy and psychiatry at which is within the [facility] system. [facility] LCSW completed referral for psychiatry and counseling at [facility] with patient's permission.   Patient Goals/Self-Care Activities: Take medications as prescribed   Attend all scheduled provider appointments Call pharmacy for medication refills 3-7 days in advance of running out of medications Perform all self care activities independently  Perform IADL's (shopping, preparing meals, housekeeping, managing finances) independently Call provider office for new concerns or questions Work with the social worker to address care coordination needs and will continue to work with the clinical team to address health care and disease management related needs call 1-800-273-TALK (toll free, 24 hour hotline) If in a crisis, go to [facility] [address], [place] [phone]) call 911 if experiencing a Mental Health or Behavioral Health Crisis  Utilize healthy coping skills and supportive resources discussed Contact PCP with any questions or concerns Keep 90 percent of counseling appointments Call your insurance provider for more information about your Enhanced Benefits  Check out counseling resources provided  Accept all calls from representative with Behavioral Health Providers in an effort to establish ongoing mental health counseling and supportive services. Incorporate into daily practice - relaxation techniques, deep breathing  exercises, and mindfulness meditation strategies. Talk about feelings with friends, family members, spiritual advisor, etc. Contact LCSW directly [phone]), if you have questions, need assistance, or if additional social work needs are identified between now and our next scheduled telephone  outreach call. Call 988 for mental health hotline/crisis line if needed (24/7 available) Try techniques to reduce symptoms of anxiety/negative thinking (deep breathing, distraction, positive self talk, etc)  - develop a personal safety plan - develop a plan to deal with triggers like holidays, anniversaries - exercise at least 2 to 3 times per week - have a plan for how to handle bad days - journal feelings and what helps to feel better or worse - spend time or talk with others at least 2 to 3 times per week - watch for early signs of feeling worse - begin personal counseling - call and visit an old friend - check out volunteer opportunities - join a support group - laugh; watch a funny movie or comedian - learn and use visualization or guided imagery - perform a random act of kindness - practice relaxation or meditation daily - start or continue a personal journal - practice positive thinking and self-talk -continue with compliance of taking medication  -identify current effective and ineffective coping strategies.  -implement positive self-talk in care to increase self-esteem, confidence and feelings of control.  -consider alternative and complementary therapy approaches such as meditation, mindfulness or yoga.  Call your insurance provider to gain education on benefits if desired Call your primary care doctor if symptoms get worse  -journaling, prayer, worship services, meditation or pastoral counseling.  -increase participation in pleasurable group activities such as hobbies, singing, sports or volunteering).  -consider the use of meditative movement therapy such as tai chi, yoga or qigong.  -start a regular daily exercise program based on tolerance, ability and patient choice to support positive thinking and activity    Follow Up Plan:  The patient has been provided with contact information for the care management team and has been advised to call with any mental health or health  related questions or concerns.  The care management team will reach out to the patient again over the next 30 business  days.   If you are experiencing a Mental Health or Behavioral Health Crisis or need someone to talk to, please call the Suicide and Crisis Lifeline: 988    Patient Goals: Follow up goal      Follow up:  Patient agrees to Care Plan and Follow-up.  Plan: The Managed Medicaid care management team will reach out to the patient again over the next 30 days.  Dickie La, BSW, MSW, LCSW Licensed Clinical Social Worker [employer]   [facility] [email or web address] Direct Dial: [phone]

## 2023-08-23 NOTE — Patient Instructions (Signed)
Visit Information  Mr. Jose Mitchell was given information about Medicaid Managed Care team care coordination services as a part of their Healthy [facility] Medicaid benefit. Jose Mitchell verbally consented to engagement with the [facility] Managed Care team.   If you are experiencing a medical emergency, please call 911 or report to your local emergency department or urgent care.   If you have a non-emergency medical problem during routine business hours, please contact your provider's office and ask to speak with a nurse.   For questions related to your Healthy [facility] health plan, please call: [phone] or visit the homepage here: [email or web address]  If you would like to schedule transportation through your Healthy [facility] plan, please call the following number at least 2 days in advance of your appointment: [phone]  For information about your ride after you set it up, call Ride Assist at [phone]. Use this number to activate a Will Call pickup, or if your transportation is late for a scheduled pickup. Use this number, too, if you need to make a change or cancel a previously scheduled reservation.  If you need transportation services right away, call [phone]. The after-hours call center is staffed 24 hours to handle ride assistance and urgent reservation requests (including discharges) 365 days a year. Urgent trips include sick visits, hospital discharge requests and life-sustaining treatment.  Call the [facility] Line at [phone], at any time, 24 hours a day, 7 days a week. If you are in danger or need immediate medical attention call 911.  If you would like help to quit smoking, call 1-800-QUIT-NOW (845-614-6563) OR Espaol: 1-855-Djelo-Ya (8-756-433-2951) o para ms informacin haga clic aqu or Text READY to 884-166 to register via text  Following is a copy of your plan of care:  Care Plan : LCSW Plan of Care   Updates made by Jose Bryant, LCSW since 08/23/2023 12:00 AM     Problem: Depression Identification (Depression)      Goal: Depressive Symptoms Identified   Start Date: 08/02/2023  Note:    Timeframe:  Short-Range Goal Priority:  High Start Date:   08/02/23           Expected End Date:  ongoing                     Follow Up Date--09/14/23 at 1 pm  - keep 90 percent of scheduled appointments -consider counseling or psychiatry -consider bumping up your self-care  -consider creating a stronger support network   Why is this important?             Combating depression may take some time.            If you don't feel better right away, don't give up on your treatment plan.    Current barriers:   Chronic Mental Health needs related to symptoms of bipolar, stress and anxiety management. Patient requires Support, Education, Resources, Referrals, Advocacy, and Care Coordination, in order to meet Unmet Mental Health Needs and to find a new therapist and psychiatrist. Patient will implement clinical interventions discussed today to decrease symptoms of depression and increase knowledge and/or ability of: coping skills. Mental Health Concerns and Social Isolation Patient lacks knowledge of available community counseling agencies and resources.  Clinical Goal(s): verbalize understanding of plan for management of Anxiety, Depression, and Stress symptoms and demonstrate a reduction in symptoms. Patient will connect with a provider for ongoing mental health treatment, increase coping skills, healthy habits, self-management skills, and stress  reduction        Patient Goals/Self-Care Activities: Take medications as prescribed   Attend all scheduled provider appointments Call pharmacy for medication refills 3-7 days in advance of running out of medications Perform all self care activities independently  Perform IADL's (shopping, preparing meals, housekeeping, managing finances) independently Call  provider office for new concerns or questions Work with the social worker to address care coordination needs and will continue to work with the clinical team to address health care and disease management related needs call 1-800-273-TALK (toll free, 24 hour hotline) If in a crisis, go to [facility] [address], [place] [phone]) call 911 if experiencing a Mental Health or Behavioral Health Crisis  Utilize healthy coping skills and supportive resources discussed Contact PCP with any questions or concerns Keep 90 percent of counseling appointments Call your insurance provider for more information about your Enhanced Benefits  Check out counseling resources provided  Accept all calls from representative with Behavioral Health Providers in an effort to establish ongoing mental health counseling and supportive services. Incorporate into daily practice - relaxation techniques, deep breathing exercises, and mindfulness meditation strategies. Talk about feelings with friends, family members, spiritual advisor, etc. Contact LCSW directly [phone]), if you have questions, need assistance, or if additional social work needs are identified between now and our next scheduled telephone outreach call. Call 988 for mental health hotline/crisis line if needed (24/7 available) Try techniques to reduce symptoms of anxiety/negative thinking (deep breathing, distraction, positive self talk, etc)  - develop a personal safety plan - develop a plan to deal with triggers like holidays, anniversaries - exercise at least 2 to 3 times per week - have a plan for how to handle bad days - journal feelings and what helps to feel better or worse - spend time or talk with others at least 2 to 3 times per week - watch for early signs of feeling worse - begin personal counseling - call and visit an old friend - check out volunteer opportunities - join a support group -  laugh; watch a funny movie or comedian - learn and use visualization or guided imagery - perform a random act of kindness - practice relaxation or meditation daily - start or continue a personal journal - practice positive thinking and self-talk -continue with compliance of taking medication  -identify current effective and ineffective coping strategies.  -implement positive self-talk in care to increase self-esteem, confidence and feelings of control.  -consider alternative and complementary therapy approaches such as meditation, mindfulness or yoga.  Call your insurance provider to gain education on benefits if desired Call your primary care doctor if symptoms get worse  -journaling, prayer, worship services, meditation or pastoral counseling.  -increase participation in pleasurable group activities such as hobbies, singing, sports or volunteering).  -consider the use of meditative movement therapy such as tai chi, yoga or qigong.  -start a regular daily exercise program based on tolerance, ability and patient choice to support positive thinking and activity    Follow Up Plan:  The patient has been provided with contact information for the care management team and has been advised to call with any mental health or health related questions or concerns.  The care management team will reach out to the patient again over the next 30 business  days.   If you are experiencing a Mental Health or Behavioral Health Crisis or need someone to talk to, please call the Suicide and Crisis Lifeline: [phone]  Patient Goals: Follow up goal     24- Hour Availability:    [facility]  [address] [place], [place] [phone] Crisis [phone]   [facility] [phone]  [place] [facility]  [phone]    [facility]  [phone] (after hours)   Therapeutic Alternative/Mobile Crisis   9064696583    [place] National Suicide Hotline  (646)847-0100 [place]) [place] 016   Call (272) 216-9362 for mental health emergencies   [place]  [phone]);  [place] and [employer]  [phone]); [place], [place], [place], [place], [place], [place], [place]    [place] [facility] for [facility] Residents For 24/7 walk-up access to mental health services for [facility] children (4+), adolescents and adults, please visit the [facility] located at [address] in [place], [place].  * also provides comprehensive outpatient behavioral health services in a variety of locations around the [place].  Connect With [place] [address] [place], [place] [address] HelpLine: 269-688-3095 or 1-5628054352  Get Directions  Find Help 24/7 By Phone Call our 24-hour HelpLine at 9803195089 or 2134089440 for immediate assistance for mental health and substance abuse issues.  Walk-In Help [place]: [facility] (Ages 4 and Up) [place]: Emergency Dept., [facility] Additional Resources National Hopeline Network: 1-800-SUICIDE The National Suicide Prevention Lifeline: 1-800-273-TALK     The following coping skill education was provided for stress relief and mental health management: "When your car dies or a deadline looms, how do you respond? Long-term, low-grade or acute stress takes a serious toll on your body and mind, so don't ignore feelings of constant tension. Stress is a natural part of life. However, too much stress can harm our health, especially if it continues every day. This is chronic stress and can put you at risk for heart problems like heart disease and depression. Understand what's happening inside your body and learn simple coping skills to combat the negative impacts of everyday stressors.  Types of Stress There are two types of  stress: Emotional - types of emotional stress are relationship problems, pressure at work, financial worries, experiencing discrimination or having a major life change. Physical - Examples of physical stress include being sick having pain, not sleeping well, recovery from an injury or having an alcohol and drug use disorder. Fight or Flight Sudden or ongoing stress activates your nervous system and floods your bloodstream with adrenaline and cortisol, two hormones that raise blood pressure, increase heart rate and spike blood sugar. These changes pitch your body into a fight or flight response. That enabled our ancestors to outrun saber-toothed tigers, and it's helpful today for situations like dodging a car accident. But most modern chronic stressors, such as finances or a challenging relationship, keep your body in that heightened state, which hurts your health. Effects of Too Much Stress If constantly under stress, most of [place] will eventually start to function less well.  Multiple studies link chronic stress to a higher risk of heart disease, stroke, depression, weight gain, memory loss and even premature death, so it's important to recognize the warning signals. Talk to your doctor about ways to manage stress if you're experiencing any of these symptoms: Prolonged periods of poor sleep. Regular, severe headaches. Unexplained weight loss or gain. Feelings of isolation, withdrawal or worthlessness. Constant anger and irritability. Loss of interest in activities. Constant worrying or  obsessive thinking. Excessive alcohol or drug use. Inability to concentrate.  10 Ways to Cope with Chronic Stress It's key to recognize stressful situations as they occur because it allows you to focus on managing how you react. We all need to know when to close our eyes and take a deep breath when we feel tension rising. Use these tips to prevent or reduce chronic stress. 1. Rebalance Work and Home All work and no  play? If you're spending too much time at the office, intentionally put more dates in your calendar to enjoy time for fun, either alone or with others. 2. Get Regular Exercise Moving your body on a regular basis balances the nervous system and increases blood circulation, helping to flush out stress hormones. Even a daily 20-minute walk makes a difference. Any kind of exercise can lower stress and improve your mood ? just pick activities that you enjoy and make it a regular habit. 3. Eat Well and Limit Alcohol and Stimulants Alcohol, nicotine and caffeine may temporarily relieve stress but have negative health impacts and can make stress worse in the long run. Well-nourished bodies cope better, so start with a good breakfast, add more organic fruits and vegetables for a well-balanced diet, avoid processed foods and sugar, try herbal tea and drink more water. 4. Connect with Supportive People Talking face to face with another person releases hormones that reduce stress. Lean on those good listeners in your life. 5. Carve Out Hobby Time Do you enjoy gardening, reading, listening to music or some other creative pursuit? Engage in activities that bring you pleasure and joy; research shows that reduces stress by almost half and lowers your heart rate, too. 6. Practice Meditation, Stress Reduction or Yoga Relaxation techniques activate a state of restfulness that counterbalances your body's fight-or-flight hormones. Even if this also means a 10-minute break in a long day: listen to music, read, go for a walk in nature, do a hobby, take a bath or spend time with a friend. Also consider doing a mindfulness exercise or try a daily deep breathing or imagery practice. Deep Breathing Slow, calm and deep breathing can help you relax. Try these steps to focus on your breathing and repeat as needed. Find a comfortable position and close your eyes. Exhale and drop your shoulders. Breathe in through your nose; fill  your lungs and then your belly. Think of relaxing your body, quieting your mind and becoming calm and peaceful. Breathe out slowly through your nose, relaxing your belly. Think of releasing tension, pain, worries or distress. Repeat steps three and four until you feel relaxed. Imagery This involves using your mind to excite the senses -- sound, vision, smell, taste and feeling. This may help ease your stress. Begin by getting comfortable and then do some slow breathing. Imagine a place you love being at. It could be somewhere from your childhood, somewhere you vacationed or just a place in your imagination. Feel how it is to be in the place you're imagining. Pay attention to the sounds, air, colors, and who is there with you. This is a place where you feel cared for and loved. All is well. You are safe. Take in all the smells, sounds, tastes and feelings. As you do, feel your body being nourished and healed. Feel the calm that surrounds you. Breathe in all the good. Breathe out any discomfort or tension. 7. Sleep Enough If you get less than seven to eight hours of sleep, your body won't tolerate stress as  well as it could. If stress keeps you up at night, address the cause, and add extra meditation into your day to make up for the lost z's. Try to get seven to nine hours of sleep each night. Make a regular bedtime schedule. Keep your room dark and cool. Try to avoid computers, TV, cell phones and tablets before bed. 8. Bond with Connections You Enjoy Go out for a coffee with a friend, chat with a neighbor, call a family member, visit with a clergy member, or even hang out with your pet. Clinical studies show that spending even a short time with a companion animal can cut anxiety levels almost in half. 9. Take a Vacation Getting away from it all can reset your stress tolerance by increasing your mental and emotional outlook, which makes you a happier, more productive person upon return. Leave your  cellphone and laptop at home! 10. See a Counselor, Coach or Therapist If negative thoughts overwhelm your ability to make positive changes, it's time to seek professional help. Make an appointment today--your health and life are worth it."  Jose Mitchell, BSW, MSW, LCSW Licensed Clinical Social Worker [employer]   [facility] [email or web address] Direct Dial: [phone]

## 2023-08-24 ENCOUNTER — Encounter (HOSPITAL_COMMUNITY): Payer: Self-pay

## 2023-08-24 ENCOUNTER — Telehealth: Payer: Self-pay | Admitting: Licensed Clinical Social Worker

## 2023-08-24 NOTE — Patient Outreach (Signed)
Care Coordination  08/24/2023  Jose Mitchell 1981/03/17 [id number]  [facility] LCSW completed outreach to patient to provide the following update as MyChart has been having issues this morning.  "Hello [name],     We are reaching out to you due to a referral we have in the system for you to receive services here at [facility], unfortunately the number we have on file for you is no longer in service - please call our office at [phone] in order to get scheduled based off of referrals. Thank you so much and have a wonderful day."  Patient agreed to contact [facility] today to get scheduled for [facility] services.  Dickie La, BSW, MSW, LCSW Licensed Clinical Social Worker [employer]   [email or web address] Direct Dial: [phone]

## 2023-08-25 ENCOUNTER — Encounter: Payer: Self-pay | Admitting: Family Medicine

## 2023-08-25 ENCOUNTER — Other Ambulatory Visit: Payer: Self-pay | Admitting: Family Medicine

## 2023-08-25 ENCOUNTER — Other Ambulatory Visit: Payer: Self-pay

## 2023-08-25 DIAGNOSIS — M5126 Other intervertebral disc displacement, lumbar region: Secondary | ICD-10-CM

## 2023-08-25 MED ORDER — IBUPROFEN 800 MG PO TABS: 800.0000 mg | ORAL_TABLET | Freq: Three times a day (TID) | ORAL | 0 refills | Status: DC | PRN

## 2023-08-25 MED ORDER — VITAMIN D (ERGOCALCIFEROL) 1.25 MG (50000 UNIT) PO CAPS: 50000.0000 [IU] | ORAL_CAPSULE | ORAL | 1 refills | Status: DC

## 2023-09-02 DIAGNOSIS — F333 Major depressive disorder, recurrent, severe with psychotic symptoms: Secondary | ICD-10-CM | POA: Diagnosis not present

## 2023-09-05 ENCOUNTER — Ambulatory Visit
Admission: EM | Admit: 2023-09-05 | Discharge: 2023-09-05 | Disposition: A | Discharge status: Home / Self Care | Payer: Medicaid Other | Attending: Nurse Practitioner | Admitting: Nurse Practitioner

## 2023-09-05 DIAGNOSIS — S61231A Puncture wound without foreign body of left index finger without damage to nail, initial encounter: Secondary | ICD-10-CM

## 2023-09-05 MED ORDER — CHLORHEXIDINE GLUCONATE 4 % EX SOLN: Freq: Every day | CUTANEOUS | 0 refills | Status: DC | PRN

## 2023-09-05 MED ORDER — MUPIROCIN 2 % EX OINT: 1.0000 | TOPICAL_OINTMENT | Freq: Two times a day (BID) | CUTANEOUS | 0 refills | Status: DC

## 2023-09-05 NOTE — Discharge Instructions (Signed)
 Keep the dressing in place for 24 hours. Apply medication as prescribed. Cleanse the area twice daily.  As discussed, after cleansing the area, and applying the medication, wrap the left index finger while you are at work and when you are out in public.  When you are home, you may leave the area open to air. Apply ice to help with pain and swelling.  Apply for 20 minutes, remove for 1 hour, repeat as needed. May take over-the-counter Tylenol  or ibuprofen  as needed for pain or discomfort. Gentle range of motion exercises of the left index finger to help decrease recovery time. As discussed, if you notice increased redness, swelling that goes up the arm, or other concerns, it is recommended that you follow-up immediately for further evaluation.  As discussed, it is recommended that you have imaging of the left index finger performed at that time. Follow-up as needed.

## 2023-09-05 NOTE — ED Provider Notes (Signed)
 [facility]    CSN: [id number] Arrival date & time: 09/05/23  1837      History   Chief Complaint Chief Complaint  Patient presents with   finger accident    HPI Jose Mitchell is a 43 y.o. male.   The history is provided by the patient.   Patient presents for a puncture wound to the left index finger that occurred while he was at work today.  Patient states he does not plan or wish to file this under Worker's Comp.  Patient states that he got a piece of metal in the side of the left index finger, similar to a "spike."  States that he did have difficulty getting the metal out of his finger.  Patient states that he now has pain that radiates up the left arm.  He was unsure of his last tetanus shot, per review of our records, last Tdap was in 2018.  Patient reports that he is right-hand dominant.  Denies fever, chills, decreased range of motion, bruising, or deformity.  Past Medical History:  Diagnosis Date   Alcohol abuse    Bipolar 1 disorder (HCC)    Cocaine abuse (HCC)    Schizophrenia (HCC)     Patient Active Problem List   Diagnosis Date Noted   Insomnia 08/19/2023   Schizoaffective disorder, depressive type (HCC) 01/28/2023   Lumbar stenosis with neurogenic claudication 12/08/2022   Pre-operative clearance 12/03/2022   Hemorrhoids 12/01/2022   Hx of adenomatous colonic polyps 12/01/2022   GERD without esophagitis 12/01/2022   Hiatal hernia 12/01/2022   Sinusitis 10/28/2022   Gastroesophageal reflux disease 10/04/2022   HNP (herniated nucleus pulposus), lumbar 10/04/2022   Epigastric pain 01/27/2022   Anxiety reaction 12/18/2021   Chronic right shoulder pain 11/24/2021   Schizophrenia, paranoid type (HCC) 06/06/2020   Restless leg 04/08/2020   Bipolar 1 disorder, depressed (HCC) 04/08/2020   Bipolar 2 disorder, major depressive episode (HCC) 04/08/2020   Low back pain radiating down leg 02/29/2020   PTSD (post-traumatic stress disorder) 05/03/2019     History reviewed. No pertinent surgical history.     Home Medications    Prior to Admission medications   Medication Sig Start Date End Date Taking? Authorizing Provider  chlorhexidine  (HIBICLENS ) 4 % external liquid Apply topically daily as needed. Mix a dime size drop of the solution with water and cleanse the affected area twice daily while symptoms persist. 09/05/23  Yes Leath-Warren, Belen Bowers, NP  mupirocin  ointment (BACTROBAN ) 2 % Apply 1 Application topically 2 (two) times daily. 09/05/23  Yes Leath-Warren, Belen Bowers, NP  gabapentin  (NEURONTIN ) 300 MG capsule Take 1 capsule (300 mg total) by mouth 2 (two) times daily as needed. 11/09/22   Zarwolo, Gloria, FNP  HYDROcodone -acetaminophen  (NORCO/VICODIN) 5-325 MG tablet Take 1 tablet by mouth at bedtime as needed for severe pain. 11/09/22   Zarwolo, Gloria, FNP  hydrOXYzine  (VISTARIL ) 25 MG capsule Take 1 capsule (25 mg total) by mouth at bedtime as needed for anxiety (sleep). 08/03/23   Mardene Shake, FNP  ibuprofen  (ADVIL ) 800 MG tablet Take 1 tablet (800 mg total) by mouth every 8 (eight) hours as needed. Take with food to prevent GI upset 08/25/23   Zarwolo, Gloria, FNP  omeprazole  (PRILOSEC) 40 MG capsule Take 1 capsule (40 mg total) by mouth 2 (two) times daily. 10/04/22   Meldon Sport, MD  pantoprazole  (PROTONIX ) 40 MG tablet Take 1 tablet (40 mg total) by mouth daily. 07/01/23 12/28/23  Zarwolo, Gloria, FNP  PRAZOSIN  HCL PO  06/14/23   [provider]  REXULTI 1 MG TABS tablet  06/14/23   [provider]  traZODone  (DESYREL ) 50 MG tablet Take 1 tablet (50 mg total) by mouth at bedtime. 08/19/23   Del Abron Abt, FNP  Vitamin D , Ergocalciferol , (DRISDOL ) 1.25 MG (50000 UNIT) CAPS capsule Take 1 capsule (50,000 Units total) by mouth every 7 (seven) days. 08/25/23   Zarwolo, Gloria, FNP    Family History Family History  Family history unknown: Yes    Social History Social History   Tobacco Use    Smoking status: Former    Current packs/day: 0.00    Types: Cigarettes    Quit date: 2021    Years since quitting: 4.1   Smokeless tobacco: Current    Types: Chew  Vaping Use   Vaping status: Never Used  Substance Use Topics   Alcohol use: Not Currently   Drug use: Not Currently    Types: Cocaine     Allergies   Sulfamethoxazole, Trimethoprim, and Sulfamethoxazole-trimethoprim   Review of Systems Review of Systems Per HPI  Physical Exam Triage Vital Signs ED Triage Vitals  Encounter Vitals Group     BP 09/05/23 1924 128/80     Systolic BP Percentile --      Diastolic BP Percentile --      Pulse Rate 09/05/23 1924 65     Resp 09/05/23 1924 18     Temp 09/05/23 1924 97.9 F (36.6 C)     Temp Source 09/05/23 1924 Oral     SpO2 09/05/23 1924 94 %     Weight --      Height --      Head Circumference --      Peak Flow --      Pain Score 09/05/23 1925 7     Pain Loc --      Pain Education --      Exclude from Growth Chart --    No data found.  Updated Vital Signs BP 128/80 (BP Location: Right Arm)   Pulse 65   Temp 97.9 F (36.6 C) (Oral)   Resp 18   SpO2 94%   Visual Acuity Right Eye Distance:   Left Eye Distance:   Bilateral Distance:    Right Eye Near:   Left Eye Near:    Bilateral Near:     Physical Exam Vitals and nursing note reviewed.  Constitutional:      General: He is not in acute distress.    Appearance: Normal appearance.  HENT:     Head: Normocephalic.  Eyes:     Extraocular Movements: Extraocular movements intact.     Pupils: Pupils are equal, round, and reactive to light.  Pulmonary:     Effort: Pulmonary effort is normal.  Musculoskeletal:     Cervical back: Normal range of motion.  Skin:    General: Skin is warm and dry.     Findings: Wound present.     Comments: Puncture wound noted to the lateral aspect of the left index finger at the proximal phalanx.  No bleeding at this time.  Minor skin disruption noted.  No obvious  bruising or deformity present. +FROM, neurovascular status is intact.  Neurological:     General: No focal deficit present.     Mental Status: He is alert and oriented to person, place, and time.  Psychiatric:        Mood and Affect: Mood normal.  Behavior: Behavior normal.      UC Treatments / Results  Labs (all labs ordered are listed, but only abnormal results are displayed) Labs Reviewed - No data to display  EKG   Radiology No results found.  Procedures Procedures (including critical care time)  Medications Ordered in UC Medications - No data to display  Initial Impression / Assessment and Plan / UC Course  I have reviewed the triage vital signs and the nursing notes.  Pertinent labs & imaging results that were available during my care of the patient were reviewed by me and considered in my medical decision making (see chart for details).  Patient with puncture wound of the left index finger.  There is no bleeding, obvious bruising, or deformity noted.  Patient declined imaging.  Last Tdap in 2018.  He also declined utilizing [employer].  The area was cleansed and a dressing was applied.  Mupirocin  2% ointment prescribed to apply to the affected area along with Hibiclens  4% external liquid to cleanse the area twice daily.  Supportive care recommendations were provided and discussed with the patient to include over-the-counter analgesics, ice, and keeping the area clean and dry.  Discussed indications regarding follow-up.  Patient was in agreement with this plan of care and verbalized understanding.  All questions were answered.  Patient stable for discharge.  Final Clinical Impressions(s) / UC Diagnoses   Final diagnoses:  Puncture wound of left index finger     Discharge Instructions      Keep the dressing in place for 24 hours. Apply medication as prescribed. Cleanse the area twice daily.  As discussed, after cleansing the area, and applying the  medication, wrap the left index finger while you are at work and when you are out in public.  When you are home, you may leave the area open to air. Apply ice to help with pain and swelling.  Apply for 20 minutes, remove for 1 hour, repeat as needed. May take over-the-counter Tylenol  or ibuprofen  as needed for pain or discomfort. Gentle range of motion exercises of the left index finger to help decrease recovery time. As discussed, if you notice increased redness, swelling that goes up the arm, or other concerns, it is recommended that you follow-up immediately for further evaluation.  As discussed, it is recommended that you have imaging of the left index finger performed at that time. Follow-up as needed.     ED Prescriptions     Medication Sig Dispense Auth. Provider   mupirocin  ointment (BACTROBAN ) 2 % Apply 1 Application topically 2 (two) times daily. 30 g Leath-Warren, Belen Bowers, NP   chlorhexidine  (HIBICLENS ) 4 % external liquid Apply topically daily as needed. Mix a dime size drop of the solution with water and cleanse the affected area twice daily while symptoms persist. 118 mL Leath-Warren, Belen Bowers, NP      PDMP not reviewed this encounter.   Hardy Lia, NP 09/05/23 2003

## 2023-09-05 NOTE — ED Triage Notes (Signed)
 Pt reports he has punctured his left index finger with a piece of metal earlier today. States his left index finger up to his arm is painful.    Tdap 2018

## 2023-09-07 ENCOUNTER — Other Ambulatory Visit: Payer: Self-pay | Admitting: Family Medicine

## 2023-09-07 DIAGNOSIS — M545 Low back pain, unspecified: Secondary | ICD-10-CM

## 2023-09-08 ENCOUNTER — Other Ambulatory Visit: Payer: Self-pay | Admitting: Family Medicine

## 2023-09-08 ENCOUNTER — Telehealth: Payer: Self-pay

## 2023-09-08 MED ORDER — HYDROCODONE-ACETAMINOPHEN 5-325 MG PO TABS: 1.0000 | ORAL_TABLET | Freq: Every evening | ORAL | 0 refills | Status: DC | PRN

## 2023-09-08 NOTE — Telephone Encounter (Signed)
Copied from CRM [id number]. Topic: Clinical - Medication Question >> Sep 07, 2023  4:26 PM Antony Haste wrote: Reason for CRM: The patient has submitted a refill for HYDROcodone-acetaminophen (NORCO/VICODIN) 5-325 MG tablet through MyChart, he is wanting this signed off to his [place] pharmacy as soon as possible. Advised the turn-around and that it may take up to 3 days to complete his request.

## 2023-09-08 NOTE — Telephone Encounter (Signed)
Last appt: 08/19/23. Requesting hydrocodone refill

## 2023-09-14 ENCOUNTER — Other Ambulatory Visit: Payer: Self-pay | Admitting: Licensed Clinical Social Worker

## 2023-09-14 DIAGNOSIS — F333 Major depressive disorder, recurrent, severe with psychotic symptoms: Secondary | ICD-10-CM | POA: Diagnosis not present

## 2023-09-14 NOTE — Patient Instructions (Signed)
Jose Mitchell ,   The [facility] Managed Care Team is available to provide assistance to you with your healthcare needs at no cost and as a benefit of your [facility] Health plan. I'm sorry I was unable to reach you today for our scheduled appointment. Our care guide will call you to reschedule our telephone appointment. Please call me at the number below. I am available to be of assistance to you regarding your healthcare needs. .   Thank you,   Dickie La, BSW, MSW, LCSW Licensed Clinical Social Worker [employer]   [email or web address] Direct Dial: [phone]

## 2023-09-14 NOTE — Patient Outreach (Addendum)
  Medicaid Managed Care   Unsuccessful Attempt Note   09/14/2023 Name: Jose Mitchell MRN: [id number] DOB: 12-27-1980  Referred by: [name], FNP Reason for referral : No chief complaint on file.   An unsuccessful telephone outreach was attempted today. The patient was referred to the case management team for assistance with care management and care coordination.    Follow Up Plan: A HIPAA compliant phone message was left for the patient providing contact information and requesting a return call.   Dickie La, BSW, MSW, LCSW Licensed Clinical Social Worker [employer]   [email or web address] Direct Dial: [phone]

## 2023-09-15 ENCOUNTER — Other Ambulatory Visit: Payer: BC Managed Care – PPO | Admitting: Licensed Clinical Social Worker

## 2023-09-15 NOTE — Patient Instructions (Signed)
Visit Information  Mr. Allinson was given information about Medicaid Managed Care team care coordination services as a part of their Healthy [facility] Medicaid benefit. Emilo Googe verbally consented to engagement with the [facility] Managed Care team.   If you are experiencing a medical emergency, please call 911 or report to your local emergency department or urgent care.   If you have a non-emergency medical problem during routine business hours, please contact your provider's office and ask to speak with a nurse.   For questions related to your Healthy [facility] health plan, please call: [phone] or visit the homepage here: [email or web address]  If you would like to schedule transportation through your Healthy [facility] plan, please call the following number at least 2 days in advance of your appointment: [phone]  For information about your ride after you set it up, call Ride Assist at [phone]. Use this number to activate a Will Call pickup, or if your transportation is late for a scheduled pickup. Use this number, too, if you need to make a change or cancel a previously scheduled reservation.  If you need transportation services right away, call [phone]. The after-hours call center is staffed 24 hours to handle ride assistance and urgent reservation requests (including discharges) 365 days a year. Urgent trips include sick visits, hospital discharge requests and life-sustaining treatment.  Call the [facility] Line at [phone], at any time, 24 hours a day, 7 days a week. If you are in danger or need immediate medical attention call 911.  If you would like help to quit smoking, call 1-800-QUIT-NOW (408-220-6824) OR Espaol: 1-855-Djelo-Ya (2-542-706-2376) o para ms informacin haga clic aqu or Text READY to 283-151 to register via text  Following is a copy of your plan of care:  Care Plan : LCSW Plan of Care   Updates made by [name], LCSW since 09/15/2023 12:00 AM     Problem: Depression Identification (Depression)      Goal: Depressive Symptoms Identified   Start Date: 08/02/2023  Note:    Timeframe:  Short-Range Goal Priority:  High Start Date:   08/02/23           Expected End Date:  ongoing                     Follow Up Date--Goal met and ended on 09/15/23  - keep 90 percent of scheduled appointments -consider counseling or psychiatry -consider bumping up your self-care  -consider creating a stronger support network   Why is this important?             Combating depression may take some time.            If you don't feel better right away, don't give up on your treatment plan.    Current barriers:   Chronic Mental Health needs related to symptoms of bipolar, stress and anxiety management. Patient requires Support, Education, Resources, Referrals, Advocacy, and Care Coordination, in order to meet Unmet Mental Health Needs and to find a new therapist and psychiatrist. Patient will implement clinical interventions discussed today to decrease symptoms of depression and increase knowledge and/or ability of: coping skills. Mental Health Concerns and Social Isolation Patient lacks knowledge of available community counseling agencies and resources.  Clinical Goal(s): verbalize understanding of plan for management of Anxiety, Depression, and Stress symptoms and demonstrate a reduction in symptoms. Patient will connect with a provider for ongoing mental health treatment, increase coping skills, healthy habits, self-management skills,  and stress reduction        Patient Goals/Self-Care Activities: Take medications as prescribed   Attend all scheduled provider appointments Call pharmacy for medication refills 3-7 days in advance of running out of medications Perform all self care activities independently  Perform IADL's (shopping, preparing meals, housekeeping, managing finances)  independently Call provider office for new concerns or questions Work with the social worker to address care coordination needs and will continue to work with the clinical team to address health care and disease management related needs call 1-800-273-TALK (toll free, 24 hour hotline) If in a crisis, go to [facility] [address] [place] [phone]) call 911 if experiencing a Mental Health or Behavioral Health Crisis  Utilize healthy coping skills and supportive resources discussed Contact PCP with any questions or concerns Keep 90 percent of counseling appointments Call your insurance provider for more information about your Enhanced Benefits  Check out counseling resources provided  Accept all calls from representative with Behavioral Health Providers in an effort to establish ongoing mental health counseling and supportive services. Incorporate into daily practice - relaxation techniques, deep breathing exercises, and mindfulness meditation strategies. Talk about feelings with friends, family members, spiritual advisor, etc. Contact LCSW directly [phone]), if you have questions, need assistance, or if additional social work needs are identified between now and our next scheduled telephone outreach call. Call 988 for mental health hotline/crisis line if needed (24/7 available) Try techniques to reduce symptoms of anxiety/negative thinking (deep breathing, distraction, positive self talk, etc)  - develop a personal safety plan - develop a plan to deal with triggers like holidays, anniversaries - exercise at least 2 to 3 times per week - have a plan for how to handle bad days - journal feelings and what helps to feel better or worse - spend time or talk with others at least 2 to 3 times per week - watch for early signs of feeling worse - begin personal counseling - call and visit an old friend - check out volunteer opportunities - join a  support group - laugh; watch a funny movie or comedian - learn and use visualization or guided imagery - perform a random act of kindness - practice relaxation or meditation daily - start or continue a personal journal - practice positive thinking and self-talk -continue with compliance of taking medication  -identify current effective and ineffective coping strategies.  -implement positive self-talk in care to increase self-esteem, confidence and feelings of control.  -consider alternative and complementary therapy approaches such as meditation, mindfulness or yoga.  Call your insurance provider to gain education on benefits if desired Call your primary care doctor if symptoms get worse  -journaling, prayer, worship services, meditation or pastoral counseling.  -increase participation in pleasurable group activities such as hobbies, singing, sports or volunteering).  -consider the use of meditative movement therapy such as tai chi, yoga or qigong.  -start a regular daily exercise program based on tolerance, ability and patient choice to support positive thinking and activity    Follow Up Plan:  The patient has been provided with contact information for the care management team and has been advised to call with any mental health or health related questions or concerns.  The care management team will reach out to the patient again over the next 30 business  days.   If you are experiencing a Mental Health or Behavioral Health Crisis or need someone to talk to, please call the Suicide and Crisis  Lifeline: 161       09- Hour Availability:    [facility]  [address] [place], [place] [phone] Crisis [phone]   [facility] [phone]  [place] [facility]  [phone]    [facility]  [phone] (after hours)   Therapeutic Alternative/Mobile Crisis   [phone]   [place] National  Suicide Hotline  2541729722 Len Childs) [place] [address]   Call 2518758621 for mental health emergencies   [facility]  [phone]);  [place] and [employer]  [phone]); [place], [place], [place], [place], [place], [place], [place]    [place] [facility] for [facility] Residents For 24/7 walk-up access to mental health services for [facility] children (4+), adolescents and adults, please visit the [facility] located at [address] in [place], [place].  *[place] also provides comprehensive outpatient behavioral health services in a variety of locations around the [place].  Connect With [place] [address] [place], [place] [address] HelpLine: (539)021-2853 or 1-878-778-0057  Get Directions  Find Help 24/7 By Phone Call our 24-hour HelpLine at 808-439-9886 or 657-044-9012 for immediate assistance for mental health and substance abuse issues.  Walk-In Help [place]: [facility] (Ages 4 and Up) [place]: Emergency Dept., [facility] Additional Resources National Hopeline Network: 1-800-SUICIDE The National Suicide Prevention Lifeline: 4-854-627-OJJK     Dickie La, BSW, MSW, LCSW Licensed Clinical Social Worker [employer]   [facility] [email or web address] Direct Dial: [phone]  Patient Goals: Follow up goal

## 2023-09-15 NOTE — Patient Outreach (Signed)
Medicaid Managed Care Social Work Note  09/15/2023 Name:  Jose Mitchell MRN:  [id number] DOB:  Mar 06, 1981  Jose Mitchell is an 43 y.o. year old male who is a primary patient of [name], FNP.  The Medicaid Managed Care Coordination team was consulted for assistance with:  Mental Health Counseling and Resources  Jose Mitchell was given information about Medicaid Managed Care Coordination team services today. Jose Mitchell Patient agreed to services and verbal consent obtained.  Engaged with patient  for by telephone forfollow up visit in response to referral for case management and/or care coordination services.   Patient is participating in a Managed Medicaid Plan:  Yes  Assessments/Interventions:  Review of past medical history, allergies, medications, health status, including review of consultants reports, laboratory and other test data, was performed as part of comprehensive evaluation and provision of chronic care management services.  SDOH: (Social Drivers of Health) assessments and interventions performed: SDOH Interventions    Flowsheet Row Patient Outreach Telephone from 09/15/2023 in [place] [facility] Patient Outreach Telephone from 08/02/2023 in [place] [facility]PARTMENT Office Visit from [facility]/13/2022 [facility] Video Visit from 12/18/2021 in [facility]  SDOH Interventions      Food Insecurity Interventions -- Intervention Not Indicated  [PT IS ON FOOD STAMPS ALREADY] -- --  Housing Interventions -- Intervention Not Indicated -- --  Transportation Interventions -- Intervention Not Indicated -- --  Depression Interventions/Treatment  -- -- Medication, Counseling Medication  Financial Strain Interventions -- Programmer, applications Provided -- --  Stress Interventions [employer] Provided, Education officer, environmental Provided, Provide Counseling -- --        Advanced Directives Status:  See Care Plan for related entries.  Care Plan                 Allergies  Allergen Reactions   Sulfamethoxazole    Trimethoprim Nausea And Vomiting   Sulfamethoxazole-Trimethoprim Rash and Other (See Comments)    Childhood allergy. Does not know reaction    Medications Reviewed Today     Reviewed by [name], LCSW (Social Worker) on 09/15/23 at 1534  Med List Status: <None>   Medication Order Taking? Sig Documenting Provider Last Dose Status Informant  chlorhexidine (HIBICLENS) 4 % external liquid [id number]  Apply topically daily as needed. Mix a dime size drop of the solution with water and cleanse the affected area twice daily while symptoms persist. Leath-Warren, Sadie Haber, NP  Active   gabapentin (NEURONTIN) 300 MG capsule [id number] No Take 1 capsule (300 mg total) by mouth 2 (two) times daily as needed. [name], FNP Taking Active   HYDROcodone-acetaminophen (NORCO/VICODIN) 5-325 MG tablet [id number]  Take 1 tablet by mouth at bedtime as needed for severe pain (pain score 7-10). [name], FNP  Active   hydrOXYzine (VISTARIL) 25 MG capsule [id number]  Take 1 capsule (25 mg total) by mouth at bedtime as needed for anxiety (sleep). [name], FNP  Active   ibuprofen (ADVIL) 800 MG tablet [id number]  Take 1 tablet (800 mg total) by mouth every 8 (eight) hours as needed. Take with food to prevent GI upset [name], FNP  Active   mupirocin ointment (BACTROBAN) 2 % [id number]  Apply 1 Application topically 2 (two) times daily. Leath-Warren, Sadie Haber, NP  Active   omeprazole (PRILOSEC) 40 MG capsule [id number] No Take 1 capsule (40 mg total) by mouth 2 (two) times daily. [name], MD Taking  Active   pantoprazole (PROTONIX) 40 MG tablet [id number]  Take 1 tablet (40 mg total) by mouth daily. [name], FNP  Active   PRAZOSIN HCL PO [id number]   [provider]  Active   REXULTI 1 MG TABS tablet [id number]    [provider]  Active   traZODone (DESYREL) 50 MG tablet [id number]  Take 1 tablet (50 mg total) by mouth at bedtime. [name], [place], FNP  Active   Vitamin D, Ergocalciferol, (DRISDOL) 1.25 MG (50000 UNIT) CAPS capsule [id number]  Take 1 capsule (50,000 Units total) by mouth every 7 (seven) days. [name], FNP  Active             Patient Active Problem List   Diagnosis Date Noted   Insomnia 08/19/2023   Schizoaffective disorder, depressive type (HCC) 01/28/2023   Lumbar stenosis with neurogenic claudication 12/08/2022   Pre-operative clearance 12/03/2022   Hemorrhoids 12/01/2022   Hx of adenomatous colonic polyps 12/01/2022   GERD without esophagitis 12/01/2022   Hiatal hernia 12/01/2022   Sinusitis 10/28/2022   Gastroesophageal reflux disease 10/04/2022   HNP (herniated nucleus pulposus), lumbar 10/04/2022   Epigastric pain 01/27/2022   Anxiety reaction 12/18/2021   Chronic right shoulder pain 11/24/2021   Schizophrenia, paranoid type (HCC) 06/06/2020   Restless leg 04/08/2020   Bipolar 1 disorder, depressed (HCC) 04/08/2020   Bipolar 2 disorder, major depressive episode (HCC) 04/08/2020   Low back pain radiating down leg 02/29/2020   PTSD (post-traumatic stress disorder) 05/03/2019    Conditions to be addressed/monitored per PCP order:  Depression  Care Plan : LCSW Plan of Care  Updates made by [name], LCSW since 09/15/2023 12:00 AM     Problem: Depression Identification (Depression)      Goal: Depressive Symptoms Identified   Start Date: 08/02/2023  Note:    Timeframe:  Short-Range Goal Priority:  High Start Date:   08/02/23           Expected End Date:  ongoing                     Follow Up Date--Goal met and ended on 09/15/23  - keep 90 percent of scheduled appointments -consider counseling or psychiatry -consider bumping up your self-care  -consider creating a stronger support network   Why is this important?              Combating depression may take some time.            If you don't feel better right away, don't give up on your treatment plan.    Current barriers:   Chronic Mental Health needs related to symptoms of bipolar, stress and anxiety management. Patient requires Support, Education, Resources, Referrals, Advocacy, and Care Coordination, in order to meet Unmet Mental Health Needs and to find a new therapist and psychiatrist. Patient will implement clinical interventions discussed today to decrease symptoms of depression and increase knowledge and/or ability of: coping skills. Mental Health Concerns and Social Isolation Patient lacks knowledge of available community counseling agencies and resources.  Clinical Goal(s): verbalize understanding of plan for management of Anxiety, Depression, and Stress symptoms and demonstrate a reduction in symptoms. Patient will connect with a provider for ongoing mental health treatment, increase coping skills, healthy habits, self-management skills, and stress reduction        Clinical Interventions:  Assessed patient's previous and current treatment, coping skills, support system and barriers to care. Patient provided hx  Verbalization of feelings encouraged, motivational interviewing employed Emotional support provided, positive coping strategies explored. Establishing healthy boundaries emphasized and healthy self-care education provided Patient was educated on available mental health resources within their area that accept Medicaid and offer counseling and psychiatry. Patient was advised to contact the back of his insurance card for assistance with benefits as well. Patient educated on the difference between therapy and psychiatry per patient request Email sent to patient today with available mental health resources within his area that accept Medicaid and offer the services that he is interested in. Email included instructions for scheduling at [facility] as well as some  crisis support resources and [facility]'s walk in clinic hours. Patient will review resources over the next two weeks and make a decision regarding where he wishes to gain MH treatment at. Patient already goes to [facility] but wishes to transition to a new provider.  Emotional support provided. CBT intervention implemented regarding "being mentally fit" by combating negative thinking and replacing it with uplifting support, hope and positivity. Patient was successful in identifying triggers to anxiety and depression symptoms, in addition, to healthy coping skills.  Patient reports significant worsening anxiety and depression impacting his ability to function appropriately and carry out daily task. Assessed social determinant of health barriers Patient receives strong support from mother LCSW provided education on relaxation techniques such as meditation, deep breathing, massage, grounding exercises or yoga that can activate the body's relaxation response and ease symptoms of stress and anxiety. LCSW ask that when pt is struggling with difficult emotions and racing thoughts that they start this relaxation response process. LCSW provided extensive education on healthy coping skills for anxiety. SW used active and reflective listening, validated patient's feelings/concerns, and provided emotional support. Patient will work on implementing appropriate self-care habits into their daily routine such as: staying positive, writing a gratitude list, drinking water, staying active around the house, taking their medications as prescribed, combating negative thoughts or emotions and staying connected with their family and friends. Positive reinforcement provided for this decision to work on this.  Motivational Interviewing employed Depression screen reviewed  PHQ2/ PHQ9 completed or reviewed  Mindfulness or Relaxation training provided Active listening / Reflection utilized  Advance Care and HCPOA education  provided Emotional Support Provided Problem Solving /Task Center strategies reviewed Provided psychoeducation for mental health needs  Provided brief CBT  Reviewed mental health medications and discussed importance of compliance:  Quality of sleep assessed & Sleep Hygiene techniques promoted  Participation in counseling encouraged  Verbalization of feelings encouraged  Suicidal Ideation/Homicidal Ideation assessed: Patient denies SI/HI but is aware of [facility] walk in clinic hours, 988 hotline and [facility] Crisis Hotline Review resources, discussed options and provided patient information about  Mental Health Resources Inter-disciplinary care team collaboration (see longitudinal plan of care) Patient recently saw PCP for insomnia and was prescribed Trazodone. He reports that he picked up this medication already. Patient reports that he has made a decision regarding where he wishes to gain therapy and psychiatry at which is within the [facility] system. [facility] LCSW completed referral for psychiatry and counseling at [facility] with patient's permission.  [facility] LCSW successfully completed follow up call with patient. Patient reports that he has now been successfully scheduled for psychiatry and counseling at [facility]. He was advised to ask to be on their cancelation list as these appointments are far out. He was advised to call [facility] to check for any cancelations so that he can get seen sooner. Patient agreeable to this plan.  Patient denies any current crises at this time. He denies any SI/HI. He was appreciative of the follow up call and is agreeable to LCSW social work case closure at this time as he is now scheduled with [facility] and has been educated on [facility] resources within his area and a has a list of these to revert back to in his email if ever needed.  Patient Goals/Self-Care Activities: Take medications as prescribed   Attend all scheduled provider appointments Call pharmacy for medication refills 3-7 days in  advance of running out of medications Perform all self care activities independently  Perform IADL's (shopping, preparing meals, housekeeping, managing finances) independently Call provider office for new concerns or questions Work with the social worker to address care coordination needs and will continue to work with the clinical team to address health care and disease management related needs call 1-800-273-TALK (toll free, 24 hour hotline) If in a crisis, go to [facility] [address], [place] [phone]) call 911 if experiencing a Mental Health or Behavioral Health Crisis  Utilize healthy coping skills and supportive resources discussed Contact PCP with any questions or concerns Keep 90 percent of counseling appointments Call your insurance provider for more information about your Enhanced Benefits  Check out counseling resources provided  Accept all calls from representative with Behavioral Health Providers in an effort to establish ongoing mental health counseling and supportive services. Incorporate into daily practice - relaxation techniques, deep breathing exercises, and mindfulness meditation strategies. Talk about feelings with friends, family members, spiritual advisor, etc. Contact LCSW directly [phone]), if you have questions, need assistance, or if additional social work needs are identified between now and our next scheduled telephone outreach call. Call 988 for mental health hotline/crisis line if needed (24/7 available) Try techniques to reduce symptoms of anxiety/negative thinking (deep breathing, distraction, positive self talk, etc)  - develop a personal safety plan - develop a plan to deal with triggers like holidays, anniversaries - exercise at least 2 to 3 times per week - have a plan for how to handle bad days - journal feelings and what helps to feel better or worse - spend time or talk with others at least 2 to 3  times per week - watch for early signs of feeling worse - begin personal counseling - call and visit an old friend - check out volunteer opportunities - join a support group - laugh; watch a funny movie or comedian - learn and use visualization or guided imagery - perform a random act of kindness - practice relaxation or meditation daily - start or continue a personal journal - practice positive thinking and self-talk -continue with compliance of taking medication  -identify current effective and ineffective coping strategies.  -implement positive self-talk in care to increase self-esteem, confidence and feelings of control.  -consider alternative and complementary therapy approaches such as meditation, mindfulness or yoga.  Call your insurance provider to gain education on benefits if desired Call your primary care doctor if symptoms get worse  -journaling, prayer, worship services, meditation or pastoral counseling.  -increase participation in pleasurable group activities such as hobbies, singing, sports or volunteering).  -consider the use of meditative movement therapy such as tai chi, yoga or qigong.  -start a regular daily exercise program based on tolerance, ability and patient choice to support positive thinking and activity    Follow Up Plan:  The patient has been provided with contact information for the care management team and has been  advised to call with any mental health or health related questions or concerns.  The care management team will reach out to the patient again over the next 30 business  days.   If you are experiencing a Mental Health or Behavioral Health Crisis or need someone to talk to, please call the Suicide and Crisis Lifeline: 988    Patient Goals: Follow up goal      Follow up:  Patient requests no follow-up at this time.  Plan: The Managed Medicaid care management team is available to follow up with the patient after provider conversation with the  patient regarding recommendation for care management engagement and subsequent re-referral to the care management team.   Dickie La, BSW, MSW, LCSW Licensed Clinical Social Worker [place]   [facility] [email or web address] Direct Dial: [phone]

## 2023-09-21 DIAGNOSIS — H5213 Myopia, bilateral: Secondary | ICD-10-CM | POA: Diagnosis not present

## 2023-09-30 DIAGNOSIS — F333 Major depressive disorder, recurrent, severe with psychotic symptoms: Secondary | ICD-10-CM | POA: Diagnosis not present

## 2023-10-04 DIAGNOSIS — F4310 Post-traumatic stress disorder, unspecified: Secondary | ICD-10-CM | POA: Diagnosis not present

## 2023-10-04 DIAGNOSIS — F333 Major depressive disorder, recurrent, severe with psychotic symptoms: Secondary | ICD-10-CM | POA: Diagnosis not present

## 2023-10-06 NOTE — Telephone Encounter (Signed)
 Appt to be scheduled 3/27 Patient aware

## 2023-10-07 NOTE — Progress Notes (Signed)
 Patient connected for virtual psychiatric medication management appointment on 10/10/23 at 3PM. Upon this writer connecting, patient was noted to be driving and reports inability to pull off as he is about 30 min from his destination. Reviewed policy that visit cannot be conducted while actively operating a vehicle and he expressed understanding. Amenable to being rescheduled and referral will be transferred to [place] office given private insurance status. He shares that he is currently seen at [facility] and looking to transition to new psychiatrist.   Daine Gip, MD 10/10/23

## 2023-10-10 ENCOUNTER — Encounter (HOSPITAL_COMMUNITY): Payer: Self-pay | Admitting: Psychiatry

## 2023-10-14 ENCOUNTER — Ambulatory Visit (INDEPENDENT_AMBULATORY_CARE_PROVIDER_SITE_OTHER): Admitting: Family Medicine

## 2023-10-14 ENCOUNTER — Encounter: Payer: Self-pay | Admitting: Family Medicine

## 2023-10-14 VITALS — BP 120/86 | HR 75 | Ht 75.0 in | Wt 235.1 lb

## 2023-10-14 DIAGNOSIS — F5101 Primary insomnia: Secondary | ICD-10-CM

## 2023-10-14 DIAGNOSIS — G8929 Other chronic pain: Secondary | ICD-10-CM | POA: Diagnosis not present

## 2023-10-14 DIAGNOSIS — F411 Generalized anxiety disorder: Secondary | ICD-10-CM

## 2023-10-14 DIAGNOSIS — E7849 Other hyperlipidemia: Secondary | ICD-10-CM | POA: Diagnosis not present

## 2023-10-14 DIAGNOSIS — E559 Vitamin D deficiency, unspecified: Secondary | ICD-10-CM

## 2023-10-14 DIAGNOSIS — E038 Other specified hypothyroidism: Secondary | ICD-10-CM | POA: Diagnosis not present

## 2023-10-14 DIAGNOSIS — R7301 Impaired fasting glucose: Secondary | ICD-10-CM | POA: Diagnosis not present

## 2023-10-14 NOTE — Progress Notes (Signed)
 Established Patient Office Visit  Subjective:  Patient ID: Jose Mitchell, male    DOB: [birth year]  Age: 43 y.o. MRN: [id number]  CC:  Chief Complaint  Patient presents with   Care Management    Pt. Needs med refills, Pt also states he has a lot of anxiety and stress, Pt also states he does not sleep good, and is up 3-4 times at night     HPI Jose Mitchell is a 43 y.o. male presents with the above complaints as noted in the chief complaint. He is under the care of a psychiatrist and is currently taking gabapentin 300 mg twice daily and trazodone 50 mg at bedtime. He has also been prescribed hydroxyzine 25 mg but reports not taking the medication as prescribed. The patient is requesting a refill of Norco 5/325 mg. He denies suicidal ideation (SI), homicidal ideation (HI), and auditory or visual hallucinations (AVH)  Past Medical History:  Diagnosis Date   Alcohol abuse    Bipolar 1 disorder (HCC)    Cocaine abuse (HCC)    Schizophrenia (HCC)     No past surgical history on file.  Family History  Family history unknown: Yes    Social History   Socioeconomic History   Marital status: Single    Spouse name: Not on file   Number of children: Not on file   Years of education: Not on file   Highest education level: GED or equivalent  Occupational History   Not on file  Tobacco Use   Smoking status: Former    Current packs/day: 0.00    Types: Cigarettes    Quit date: 2021    Years since quitting: 4.2   Smokeless tobacco: Current    Types: Chew  Vaping Use   Vaping status: Never Used  Substance and Sexual Activity   Alcohol use: Not Currently   Drug use: Not Currently    Types: Cocaine   Sexual activity: Not Currently  Other Topics Concern   Not on file  Social History Narrative   Not on file   Social Drivers of Health   Financial Resource Strain: High Risk (08/19/2023)   Overall Financial Resource Strain (CARDIA)    Difficulty of Paying Living Expenses: Very  hard  Food Insecurity: Food Insecurity Present (08/19/2023)   Hunger Vital Sign    Worried About Running Out of Food in the Last Year: Sometimes true    Ran Out of Food in the Last Year: Patient declined  Transportation Needs: No Transportation Needs (08/19/2023)   PRAPARE - Administrator, Civil Service (Medical): No    Lack of Transportation (Non-Medical): No  Physical Activity: Sufficiently Active (08/19/2023)   Exercise Vital Sign    Days of Exercise per Week: 3 days    Minutes of Exercise per Session: 50 min  Recent Concern: Physical Activity - Insufficiently Active (06/27/2023)   Exercise Vital Sign    Days of Exercise per Week: 1 day    Minutes of Exercise per Session: 10 min  Stress: Stress Concern Present (09/15/2023)   [employer] of Occupational Health - Occupational Stress Questionnaire    Feeling of Stress : To some extent  Social Connections: Moderately Integrated (08/19/2023)   Social Connection and Isolation Panel [NHANES]    Frequency of Communication with Friends and Family: Three times a week    Frequency of Social Gatherings with Friends and Family: More than three times a week    Attends Religious Services: More than 4  times per year    Active Member of Clubs or Organizations: Yes    Attends Banker Meetings: 1 to 4 times per year    Marital Status: Divorced  Catering manager Violence: Not on file    Outpatient Medications Prior to Visit  Medication Sig Dispense Refill   gabapentin (NEURONTIN) 300 MG capsule Take 1 capsule (300 mg total) by mouth 2 (two) times daily as needed. 60 capsule 1   HYDROcodone-acetaminophen (NORCO/VICODIN) 5-325 MG tablet Take 1 tablet by mouth at bedtime as needed for severe pain (pain score 7-10). 20 tablet 0   pantoprazole (PROTONIX) 40 MG tablet Take 1 tablet (40 mg total) by mouth daily. 60 tablet 2   PRAZOSIN HCL PO      REXULTI 1 MG TABS tablet      traZODone (DESYREL) 50 MG tablet Take 1 tablet (50  mg total) by mouth at bedtime. 30 tablet 3   Vitamin D, Ergocalciferol, (DRISDOL) 1.25 MG (50000 UNIT) CAPS capsule Take 1 capsule (50,000 Units total) by mouth every 7 (seven) days. 5 capsule 1   chlorhexidine (HIBICLENS) 4 % external liquid Apply topically daily as needed. Mix a dime size drop of the solution with water and cleanse the affected area twice daily while symptoms persist. (Patient not taking: Reported on 10/14/2023) 118 mL 0   hydrOXYzine (VISTARIL) 25 MG capsule Take 1 capsule (25 mg total) by mouth at bedtime as needed for anxiety (sleep). (Patient not taking: Reported on 10/14/2023) 30 capsule 1   ibuprofen (ADVIL) 800 MG tablet Take 1 tablet (800 mg total) by mouth every 8 (eight) hours as needed. Take with food to prevent GI upset (Patient not taking: Reported on 10/14/2023) 30 tablet 0   mupirocin ointment (BACTROBAN) 2 % Apply 1 Application topically 2 (two) times daily. (Patient not taking: Reported on 10/14/2023) 30 g 0   omeprazole (PRILOSEC) 40 MG capsule Take 1 capsule (40 mg total) by mouth 2 (two) times daily. (Patient not taking: Reported on 10/14/2023) 180 capsule 1   No facility-administered medications prior to visit.    Allergies  Allergen Reactions   Sulfamethoxazole    Trimethoprim Nausea And Vomiting   Sulfamethoxazole-Trimethoprim Rash and Other (See Comments)    Childhood allergy. Does not know reaction    ROS Review of Systems  Constitutional:  Negative for fatigue and fever.  Eyes:  Negative for visual disturbance.  Respiratory:  Negative for chest tightness and shortness of breath.   Cardiovascular:  Negative for chest pain and palpitations.  Musculoskeletal:  Positive for back pain.  Neurological:  Negative for dizziness and headaches.      Objective:    Physical Exam HENT:     Head: Normocephalic.     Right Ear: External ear normal.     Left Ear: External ear normal.     Nose: No congestion or rhinorrhea.     Mouth/Throat:     Mouth:  Mucous membranes are moist.  Cardiovascular:     Rate and Rhythm: Regular rhythm.     Heart sounds: No murmur heard. Pulmonary:     Effort: No respiratory distress.     Breath sounds: Normal breath sounds.  Neurological:     Mental Status: He is alert.  Psychiatric:        Mood and Affect: Mood and affect normal.        Speech: Speech normal.     BP 120/86 (BP Location: Right Arm, Patient Position: Sitting, Cuff Size: Large)  Pulse 75   Ht 6\' 3"  (1.905 m)   Wt 235 lb 1.9 oz (106.6 kg)   SpO2 94%   BMI 29.39 kg/m  Wt Readings from Last 3 Encounters:  10/14/23 235 lb 1.9 oz (106.6 kg)  08/19/23 237 lb 0.6 oz (107.5 kg)  02/03/23 207 lb (93.9 kg)    Lab Results  Component Value Date   TSH 0.522 10/14/2023   Lab Results  Component Value Date   WBC 7.9 10/14/2023   HGB 16.9 10/14/2023   HCT 48.1 10/14/2023   MCV 86 10/14/2023   PLT 274 10/14/2023   Lab Results  Component Value Date   NA 140 10/14/2023   K 4.9 10/14/2023   CO2 24 10/14/2023   GLUCOSE 84 10/14/2023   BUN 10 10/14/2023   CREATININE 1.23 10/14/2023   BILITOT 0.4 10/14/2023   ALKPHOS 67 10/14/2023   AST 24 10/14/2023   ALT 24 10/14/2023   PROT 6.9 10/14/2023   ALBUMIN 4.7 10/14/2023   CALCIUM 10.1 10/14/2023   ANIONGAP 9 02/22/2018   EGFR 74 10/14/2023   Lab Results  Component Value Date   CHOL 167 10/14/2023   Lab Results  Component Value Date   HDL 40 10/14/2023   Lab Results  Component Value Date   LDLCALC 106 (H) 10/14/2023   Lab Results  Component Value Date   TRIG 115 10/14/2023   Lab Results  Component Value Date   CHOLHDL 4.2 10/14/2023   Lab Results  Component Value Date   HGBA1C 6.1 (H) 10/14/2023      Assessment & Plan:  Primary insomnia Assessment & Plan: Continue taking Trazodone 50 mg at bedtime. Encouraged to Follow up with his psychiatrist for any adjustments to his sleep medication.   Anxiety reaction Assessment & Plan: Continue taking Gabapentin 300  mg twice daily as prescribed. You may take Hydroxyzine 25 mg every eight hours as needed for severe anxiety. If you experience excessive drowsiness with Hydroxyzine, you may discontinue the medication.   Other chronic pain Assessment & Plan: The patient was informed that Norco will be refilled after reviewing the results of his urine drug screening. A referral has been placed to Pain Management for long-term management of his chronic pain.  Orders: -     Ambulatory referral to Pain Clinic -     Urinalysis, Routine w reflex microscopic  IFG (impaired fasting glucose) -     Hemoglobin A1c  Vitamin D deficiency -     VITAMIN D 25 Hydroxy (Vit-D Deficiency, Fractures)  TSH (thyroid-stimulating hormone deficiency) -     TSH + free T4  Other hyperlipidemia -     Lipid panel -     CMP14+EGFR -     CBC with Differential/Platelet  Note: This chart has been completed using Engineer, civil (consulting) software, and while attempts have been made to ensure accuracy, certain words and phrases may not be transcribed as intended.    Follow-up: Return in about 6 months (around 04/15/2024).   [name], FNP

## 2023-10-14 NOTE — Patient Instructions (Addendum)
 I appreciate the opportunity to provide care to you today!    Follow up:  6 months  Labs: please stop by the lab today to get your blood drawn (CBC, CMP, TSH, Lipid profile, HgA1c, Vit D)  Anxiety Continue taking Gabapentin 300 mg twice daily as prescribed. You may take Hydroxyzine 25 mg every eight hours as needed for severe anxiety. If you experience excessive drowsiness with Hydroxyzine, you may discontinue the medication. Insomnia Continue taking Trazodone 50 mg at bedtime. Follow up with your psychiatrist for any adjustments to your sleep medication. Chronic Pain Management I will refill your Hydrocodone after reviewing the results of your urine drug screening. A referral has been placed to Pain Management for long-term management of your chronic pain.    Referrals today- Pain management    Please continue to a heart-healthy diet and increase your physical activities. Try to exercise for at least five days a week.    It was a pleasure to see you and I look forward to continuing to work together on your health and well-being. Please do not hesitate to call the office if you need care or have questions about your care.  In case of emergency, please visit the Emergency Department for urgent care, or contact our clinic at [phone] to schedule an appointment. We're here to help you!   Have a wonderful day and week. With Gratitude, [name] MSN, FNP-BC

## 2023-10-15 ENCOUNTER — Encounter: Payer: Self-pay | Admitting: Family Medicine

## 2023-10-15 DIAGNOSIS — G8929 Other chronic pain: Secondary | ICD-10-CM | POA: Insufficient documentation

## 2023-10-15 HISTORY — DX: Other chronic pain: G89.29

## 2023-10-15 LAB — CMP14+EGFR
ALT: 24 IU/L (ref 0–44)
AST: 24 IU/L (ref 0–40)
Albumin: 4.7 g/dL (ref 4.1–5.1)
Alkaline Phosphatase: 67 IU/L (ref 44–121)
BUN/Creatinine Ratio: 8 — ABNORMAL LOW (ref 9–20)
BUN: 10 mg/dL (ref 6–24)
Bilirubin Total: 0.4 mg/dL (ref 0.0–1.2)
CO2: 24 mmol/L (ref 20–29)
Calcium: 10.1 mg/dL (ref 8.7–10.2)
Chloride: 100 mmol/L (ref 96–106)
Creatinine, Ser: 1.23 mg/dL (ref 0.76–1.27)
Globulin, Total: 2.2 g/dL (ref 1.5–4.5)
Glucose: 84 mg/dL (ref 70–99)
Potassium: 4.9 mmol/L (ref 3.5–5.2)
Sodium: 140 mmol/L (ref 134–144)
Total Protein: 6.9 g/dL (ref 6.0–8.5)
eGFR: 74 mL/min/{1.73_m2} (ref >59)

## 2023-10-15 LAB — CBC WITH DIFFERENTIAL/PLATELET
Basophils Absolute: 0.1 10*3/uL (ref 0.0–0.2)
Basos: 1 %
EOS (ABSOLUTE): 0.5 10*3/uL — ABNORMAL HIGH (ref 0.0–0.4)
Eos: 6 %
Hematocrit: 48.1 % (ref 37.5–51.0)
Hemoglobin: 16.9 g/dL (ref 13.0–17.7)
Immature Grans (Abs): 0 10*3/uL (ref 0.0–0.1)
Immature Granulocytes: 0 %
Lymphocytes Absolute: 2.5 10*3/uL (ref 0.7–3.1)
Lymphs: 32 %
MCH: 30.3 pg (ref 26.6–33.0)
MCHC: 35.1 g/dL (ref 31.5–35.7)
MCV: 86 fL (ref 79–97)
Monocytes Absolute: 0.7 10*3/uL (ref 0.1–0.9)
Monocytes: 9 %
Neutrophils Absolute: 4.1 10*3/uL (ref 1.4–7.0)
Neutrophils: 52 %
Platelets: 274 10*3/uL (ref 150–450)
RBC: 5.57 x10E6/uL (ref 4.14–5.80)
RDW: 11.7 % (ref 11.6–15.4)
WBC: 7.9 10*3/uL (ref 3.4–10.8)

## 2023-10-15 LAB — LIPID PANEL
Chol/HDL Ratio: 4.2 ratio (ref 0.0–5.0)
Cholesterol, Total: 167 mg/dL (ref 100–199)
HDL: 40 mg/dL (ref >39)
LDL Chol Calc (NIH): 106 mg/dL — ABNORMAL HIGH (ref 0–99)
Triglycerides: 115 mg/dL (ref 0–149)
VLDL Cholesterol Cal: 21 mg/dL (ref 5–40)

## 2023-10-15 LAB — HEMOGLOBIN A1C
Est. average glucose Bld gHb Est-mCnc: 128 mg/dL
Hgb A1c MFr Bld: 6.1 % — ABNORMAL HIGH (ref 4.8–5.6)

## 2023-10-15 LAB — TSH+FREE T4
Free T4: 0.82 ng/dL (ref 0.82–1.77)
TSH: 0.522 u[IU]/mL (ref 0.450–4.500)

## 2023-10-15 LAB — URINALYSIS, ROUTINE W REFLEX MICROSCOPIC
Bilirubin, UA: NEGATIVE
Glucose, UA: NEGATIVE
Ketones, UA: NEGATIVE
Leukocytes,UA: NEGATIVE
Nitrite, UA: NEGATIVE
Protein,UA: NEGATIVE
RBC, UA: NEGATIVE
Specific Gravity, UA: 1.008 (ref 1.005–1.030)
Urobilinogen, Ur: 0.2 mg/dL (ref 0.2–1.0)
pH, UA: 7 (ref 5.0–7.5)

## 2023-10-15 LAB — VITAMIN D 25 HYDROXY (VIT D DEFICIENCY, FRACTURES): Vit D, 25-Hydroxy: 22.7 ng/mL — ABNORMAL LOW (ref 30.0–100.0)

## 2023-10-15 NOTE — Assessment & Plan Note (Signed)
 Continue taking Gabapentin 300 mg twice daily as prescribed. You may take Hydroxyzine 25 mg every eight hours as needed for severe anxiety. If you experience excessive drowsiness with Hydroxyzine, you may discontinue the medication.

## 2023-10-15 NOTE — Assessment & Plan Note (Addendum)
 The patient was informed that Norco will be refilled after reviewing the results of his urine drug screening. A referral has been placed to Pain Management for long-term management of his chronic pain.

## 2023-10-15 NOTE — Assessment & Plan Note (Signed)
 Continue taking Trazodone 50 mg at bedtime. Encouraged to Follow up with his psychiatrist for any adjustments to his sleep medication.

## 2023-10-17 DIAGNOSIS — F333 Major depressive disorder, recurrent, severe with psychotic symptoms: Secondary | ICD-10-CM | POA: Diagnosis not present

## 2023-10-17 DIAGNOSIS — F17290 Nicotine dependence, other tobacco product, uncomplicated: Secondary | ICD-10-CM | POA: Diagnosis not present

## 2023-10-17 DIAGNOSIS — F4310 Post-traumatic stress disorder, unspecified: Secondary | ICD-10-CM | POA: Diagnosis not present

## 2023-10-19 ENCOUNTER — Other Ambulatory Visit: Payer: Self-pay | Admitting: Family Medicine

## 2023-10-19 DIAGNOSIS — M79606 Pain in leg, unspecified: Secondary | ICD-10-CM

## 2023-10-19 NOTE — Telephone Encounter (Signed)
 Copied from CRM [id number]. Topic: Clinical - Medication Refill >> Oct 19, 2023 11:06 AM Patsy Lager T wrote: Most Recent Primary Care Visit:  Provider: [name]  Department: [facility]  Visit Type: OFFICE VISIT  Date: 10/14/2023  Medication: HYDROcodone-acetaminophen (NORCO/VICODIN) 5-325 MG tablet  Has the patient contacted their pharmacy? No  Is this the correct pharmacy for this prescription? Yes If no, delete pharmacy and type the correct one.  This is the patient's preferred pharmacy:  [facility] [address], [place] - [address] [place] [address] [place] [address] Phone: [phone] Fax: [phone]   Has the prescription been filled recently? Yes  Is the patient out of the medication? Yes  Has the patient been seen for an appointment in the last year OR does the patient have an upcoming appointment? Yes  Can we respond through MyChart? No  Agent: Please be advised that Rx refills may take up to 3 business days. We ask that you follow-up with your pharmacy.

## 2023-10-20 DIAGNOSIS — F333 Major depressive disorder, recurrent, severe with psychotic symptoms: Secondary | ICD-10-CM | POA: Diagnosis not present

## 2023-10-22 ENCOUNTER — Other Ambulatory Visit: Payer: Self-pay | Admitting: Family Medicine

## 2023-10-22 DIAGNOSIS — G8929 Other chronic pain: Secondary | ICD-10-CM

## 2023-10-24 ENCOUNTER — Encounter (HOSPITAL_COMMUNITY): Payer: Self-pay

## 2023-10-24 ENCOUNTER — Inpatient Hospital Stay (HOSPITAL_COMMUNITY)
Admission: AD | Admit: 2023-10-24 | Discharge: 2023-10-26 | DRG: 885 | Disposition: A | Discharge status: Home / Self Care | Source: Intra-hospital | Attending: Psychiatry | Admitting: Psychiatry

## 2023-10-24 ENCOUNTER — Emergency Department (HOSPITAL_COMMUNITY)
Admission: EM | Admit: 2023-10-24 | Discharge: 2023-10-24 | Disposition: A | Discharge status: Other Acute Inpatient Hospital | Attending: Emergency Medicine | Admitting: Emergency Medicine

## 2023-10-24 DIAGNOSIS — F259 Schizoaffective disorder, unspecified: Secondary | ICD-10-CM | POA: Insufficient documentation

## 2023-10-24 DIAGNOSIS — F1410 Cocaine abuse, uncomplicated: Secondary | ICD-10-CM | POA: Insufficient documentation

## 2023-10-24 DIAGNOSIS — G8929 Other chronic pain: Secondary | ICD-10-CM | POA: Diagnosis present

## 2023-10-24 DIAGNOSIS — F315 Bipolar disorder, current episode depressed, severe, with psychotic features: Secondary | ICD-10-CM | POA: Diagnosis not present

## 2023-10-24 DIAGNOSIS — Z79899 Other long term (current) drug therapy: Secondary | ICD-10-CM

## 2023-10-24 DIAGNOSIS — F19951 Other psychoactive substance use, unspecified with psychoactive substance-induced psychotic disorder with hallucinations: Principal | ICD-10-CM | POA: Diagnosis present

## 2023-10-24 DIAGNOSIS — Z888 Allergy status to other drugs, medicaments and biological substances status: Secondary | ICD-10-CM

## 2023-10-24 DIAGNOSIS — F333 Major depressive disorder, recurrent, severe with psychotic symptoms: Secondary | ICD-10-CM | POA: Diagnosis not present

## 2023-10-24 DIAGNOSIS — M549 Dorsalgia, unspecified: Secondary | ICD-10-CM | POA: Diagnosis present

## 2023-10-24 DIAGNOSIS — R443 Hallucinations, unspecified: Secondary | ICD-10-CM

## 2023-10-24 DIAGNOSIS — Z833 Family history of diabetes mellitus: Secondary | ICD-10-CM

## 2023-10-24 DIAGNOSIS — F14159 Cocaine abuse with cocaine-induced psychotic disorder, unspecified: Secondary | ICD-10-CM | POA: Diagnosis present

## 2023-10-24 DIAGNOSIS — Z5941 Food insecurity: Secondary | ICD-10-CM

## 2023-10-24 DIAGNOSIS — F17210 Nicotine dependence, cigarettes, uncomplicated: Secondary | ICD-10-CM | POA: Diagnosis not present

## 2023-10-24 DIAGNOSIS — Z882 Allergy status to sulfonamides status: Secondary | ICD-10-CM

## 2023-10-24 DIAGNOSIS — Y900 Blood alcohol level of less than 20 mg/100 ml: Secondary | ICD-10-CM | POA: Insufficient documentation

## 2023-10-24 DIAGNOSIS — R9431 Abnormal electrocardiogram [ECG] [EKG]: Secondary | ICD-10-CM | POA: Diagnosis not present

## 2023-10-24 DIAGNOSIS — F4310 Post-traumatic stress disorder, unspecified: Secondary | ICD-10-CM | POA: Diagnosis not present

## 2023-10-24 DIAGNOSIS — F22 Delusional disorders: Secondary | ICD-10-CM | POA: Diagnosis not present

## 2023-10-24 DIAGNOSIS — D72829 Elevated white blood cell count, unspecified: Secondary | ICD-10-CM | POA: Insufficient documentation

## 2023-10-24 DIAGNOSIS — F200 Paranoid schizophrenia: Secondary | ICD-10-CM | POA: Diagnosis present

## 2023-10-24 DIAGNOSIS — G4700 Insomnia, unspecified: Secondary | ICD-10-CM | POA: Diagnosis present

## 2023-10-24 DIAGNOSIS — F29 Unspecified psychosis not due to a substance or known physiological condition: Secondary | ICD-10-CM | POA: Diagnosis not present

## 2023-10-24 DIAGNOSIS — Z5986 Financial insecurity: Secondary | ICD-10-CM

## 2023-10-24 HISTORY — DX: Bipolar disorder, current episode depressed, severe, with psychotic features: F31.5

## 2023-10-24 LAB — CBC WITH DIFFERENTIAL/PLATELET
Abs Immature Granulocytes: 0.06 10*3/uL (ref 0.00–0.07)
Basophils Absolute: 0.1 10*3/uL (ref 0.0–0.1)
Basophils Relative: 0 %
Eosinophils Absolute: 0 10*3/uL (ref 0.0–0.5)
Eosinophils Relative: 0 %
HCT: 46.5 % (ref 39.0–52.0)
Hemoglobin: 15.9 g/dL (ref 13.0–17.0)
Immature Granulocytes: 0 %
Lymphocytes Relative: 12 %
Lymphs Abs: 1.9 10*3/uL (ref 0.7–4.0)
MCH: 29.7 pg (ref 26.0–34.0)
MCHC: 34.2 g/dL (ref 30.0–36.0)
MCV: 86.9 fL (ref 80.0–100.0)
Monocytes Absolute: 0.9 10*3/uL (ref 0.1–1.0)
Monocytes Relative: 6 %
Neutro Abs: 12.5 10*3/uL — ABNORMAL HIGH (ref 1.7–7.7)
Neutrophils Relative %: 82 %
Platelets: 291 10*3/uL (ref 150–400)
RBC: 5.35 MIL/uL (ref 4.22–5.81)
RDW: 12.1 % (ref 11.5–15.5)
WBC: 15.4 10*3/uL — ABNORMAL HIGH (ref 4.0–10.5)
nRBC: 0 % (ref 0.0–0.2)

## 2023-10-24 LAB — RAPID URINE DRUG SCREEN, HOSP PERFORMED
Amphetamines: NOT DETECTED
Barbiturates: NOT DETECTED
Benzodiazepines: NOT DETECTED
Cocaine: POSITIVE — AB
Opiates: NOT DETECTED
Tetrahydrocannabinol: NOT DETECTED

## 2023-10-24 LAB — COMPREHENSIVE METABOLIC PANEL WITH GFR
ALT: 28 U/L (ref 0–44)
AST: 26 U/L (ref 15–41)
Albumin: 4.5 g/dL (ref 3.5–5.0)
Alkaline Phosphatase: 45 U/L (ref 38–126)
Anion gap: 12 (ref 5–15)
BUN: 12 mg/dL (ref 6–20)
CO2: 23 mmol/L (ref 22–32)
Calcium: 9.6 mg/dL (ref 8.9–10.3)
Chloride: 103 mmol/L (ref 98–111)
Creatinine, Ser: 1.11 mg/dL (ref 0.61–1.24)
GFR, Estimated: >60 mL/min (ref >60)
Glucose, Bld: 122 mg/dL — ABNORMAL HIGH (ref 70–99)
Potassium: 3.9 mmol/L (ref 3.5–5.1)
Sodium: 138 mmol/L (ref 135–145)
Total Bilirubin: 0.6 mg/dL (ref 0.0–1.2)
Total Protein: 7.5 g/dL (ref 6.5–8.1)

## 2023-10-24 LAB — ETHANOL: Alcohol, Ethyl (B): <10 mg/dL (ref <10)

## 2023-10-24 MED ORDER — GABAPENTIN 300 MG PO CAPS
300.0000 mg | ORAL_CAPSULE | Freq: Three times a day (TID) | ORAL | Status: DC
  Administered 2023-10-24 (×2): 300 mg via ORAL
  Filled 2023-10-24 (×2): qty 1

## 2023-10-24 MED ORDER — LORAZEPAM 1 MG PO TABS
1.0000 mg | ORAL_TABLET | Freq: Once | ORAL | Status: AC
  Administered 2023-10-24: 1 mg via ORAL
  Filled 2023-10-24: qty 1

## 2023-10-24 MED ORDER — PANTOPRAZOLE SODIUM 40 MG PO TBEC
40.0000 mg | DELAYED_RELEASE_TABLET | Freq: Every day | ORAL | Status: DC
  Administered 2023-10-25 – 2023-10-26 (×2): 40 mg via ORAL
  Filled 2023-10-24 (×4): qty 1

## 2023-10-24 MED ORDER — PANTOPRAZOLE SODIUM 40 MG PO TBEC
40.0000 mg | DELAYED_RELEASE_TABLET | Freq: Every day | ORAL | Status: DC
  Administered 2023-10-24: 40 mg via ORAL
  Filled 2023-10-24: qty 1

## 2023-10-24 MED ORDER — PRAZOSIN HCL 1 MG PO CAPS: 2.0000 mg | ORAL_CAPSULE | Freq: Every day | ORAL | Status: DC

## 2023-10-24 MED ORDER — PRAZOSIN HCL 2 MG PO CAPS
2.0000 mg | ORAL_CAPSULE | Freq: Every day | ORAL | Status: DC
  Administered 2023-10-24 – 2023-10-25 (×2): 2 mg via ORAL
  Filled 2023-10-24 (×2): qty 1
  Filled 2023-10-24: qty 2
  Filled 2023-10-24 (×2): qty 1

## 2023-10-24 MED ORDER — METHOCARBAMOL 750 MG PO TABS
750.0000 mg | ORAL_TABLET | Freq: Three times a day (TID) | ORAL | Status: DC | PRN
  Administered 2023-10-25 – 2023-10-26 (×2): 750 mg via ORAL
  Filled 2023-10-24 (×2): qty 1

## 2023-10-24 MED ORDER — DIPHENHYDRAMINE HCL 50 MG/ML IJ SOLN: 50.0000 mg | Freq: Three times a day (TID) | INTRAMUSCULAR | Status: DC | PRN

## 2023-10-24 MED ORDER — LORAZEPAM 1 MG PO TABS
1.0000 mg | ORAL_TABLET | Freq: Once | ORAL | Status: AC
Start: 2023-10-24 — End: 2023-10-24
  Administered 2023-10-24: 1 mg via ORAL
  Filled 2023-10-24: qty 1

## 2023-10-24 MED ORDER — METHOCARBAMOL 750 MG PO TABS: 750.0000 mg | ORAL_TABLET | Freq: Four times a day (QID) | ORAL | Status: DC | PRN

## 2023-10-24 MED ORDER — NICOTINE 21 MG/24HR TD PT24
21.0000 mg | MEDICATED_PATCH | Freq: Once | TRANSDERMAL | Status: AC
  Administered 2023-10-24: 21 mg via TRANSDERMAL
  Filled 2023-10-24 (×2): qty 1

## 2023-10-24 MED ORDER — ACETAMINOPHEN 325 MG PO TABS
650.0000 mg | ORAL_TABLET | Freq: Four times a day (QID) | ORAL | Status: DC | PRN
  Administered 2023-10-25 – 2023-10-26 (×3): 650 mg via ORAL
  Filled 2023-10-24 (×3): qty 2

## 2023-10-24 MED ORDER — ACETAMINOPHEN 500 MG PO TABS
1000.0000 mg | ORAL_TABLET | Freq: Once | ORAL | Status: AC
  Administered 2023-10-24: 1000 mg via ORAL
  Filled 2023-10-24: qty 2

## 2023-10-24 MED ORDER — NICOTINE 14 MG/24HR TD PT24
14.0000 mg | MEDICATED_PATCH | Freq: Once | TRANSDERMAL | Status: DC
Start: 2023-10-24 — End: 2023-10-24
  Administered 2023-10-24: 14 mg via TRANSDERMAL
  Filled 2023-10-24: qty 1

## 2023-10-24 MED ORDER — HYDROCODONE-ACETAMINOPHEN 5-325 MG PO TABS
1.0000 | ORAL_TABLET | Freq: Once | ORAL | Status: AC
  Administered 2023-10-24: 1 via ORAL
  Filled 2023-10-24: qty 1

## 2023-10-24 MED ORDER — LORAZEPAM 2 MG/ML IJ SOLN
2.0000 mg | Freq: Three times a day (TID) | INTRAMUSCULAR | Status: DC | PRN
Start: 2023-10-24 — End: 2023-10-26

## 2023-10-24 MED ORDER — HALOPERIDOL LACTATE 5 MG/ML IJ SOLN: 5.0000 mg | Freq: Three times a day (TID) | INTRAMUSCULAR | Status: DC | PRN

## 2023-10-24 MED ORDER — LORAZEPAM 2 MG/ML IJ SOLN: 2.0000 mg | Freq: Three times a day (TID) | INTRAMUSCULAR | Status: DC | PRN

## 2023-10-24 MED ORDER — NICOTINE 21 MG/24HR TD PT24
21.0000 mg | MEDICATED_PATCH | Freq: Once | TRANSDERMAL | Status: DC
  Administered 2023-10-24: 21 mg via TRANSDERMAL
  Filled 2023-10-24: qty 1

## 2023-10-24 MED ORDER — GABAPENTIN 300 MG PO CAPS
300.0000 mg | ORAL_CAPSULE | Freq: Three times a day (TID) | ORAL | Status: DC
  Administered 2023-10-25 – 2023-10-26 (×5): 300 mg via ORAL
  Filled 2023-10-24 (×11): qty 1

## 2023-10-24 MED ORDER — TRAZODONE HCL 100 MG PO TABS
100.0000 mg | ORAL_TABLET | Freq: Every day | ORAL | Status: DC
  Administered 2023-10-24 – 2023-10-25 (×2): 100 mg via ORAL
  Filled 2023-10-24 (×6): qty 1

## 2023-10-24 MED ORDER — RISPERIDONE 2 MG PO TABS
2.0000 mg | ORAL_TABLET | Freq: Every day | ORAL | Status: DC
  Administered 2023-10-24 – 2023-10-25 (×2): 2 mg via ORAL
  Filled 2023-10-24 (×5): qty 1

## 2023-10-24 MED ORDER — TRAZODONE HCL 100 MG PO TABS: 100.0000 mg | ORAL_TABLET | Freq: Every day | ORAL | Status: DC

## 2023-10-24 MED ORDER — HALOPERIDOL LACTATE 5 MG/ML IJ SOLN: 10.0000 mg | Freq: Three times a day (TID) | INTRAMUSCULAR | Status: DC | PRN

## 2023-10-24 MED ORDER — ALUM & MAG HYDROXIDE-SIMETH 200-200-20 MG/5ML PO SUSP: 30.0000 mL | ORAL | Status: DC | PRN

## 2023-10-24 MED ORDER — DIPHENHYDRAMINE HCL 25 MG PO CAPS: 50.0000 mg | ORAL_CAPSULE | Freq: Three times a day (TID) | ORAL | Status: DC | PRN

## 2023-10-24 MED ORDER — RISPERIDONE 2 MG PO TABS: 2.0000 mg | ORAL_TABLET | Freq: Every day | ORAL | Status: DC

## 2023-10-24 MED ORDER — DIPHENHYDRAMINE HCL 50 MG/ML IJ SOLN
50.0000 mg | Freq: Three times a day (TID) | INTRAMUSCULAR | Status: DC | PRN
Start: 2023-10-24 — End: 2023-10-26

## 2023-10-24 MED ORDER — HALOPERIDOL 5 MG PO TABS: 5.0000 mg | ORAL_TABLET | Freq: Three times a day (TID) | ORAL | Status: DC | PRN

## 2023-10-24 NOTE — ED Notes (Signed)
 Belongings in locker 48

## 2023-10-24 NOTE — ED Notes (Addendum)
 Report given to [place] at [facility].

## 2023-10-24 NOTE — ED Notes (Signed)
 Pt has been dc in sheriff custody

## 2023-10-24 NOTE — ED Notes (Signed)
 Sheriff has been contacted to transportation.

## 2023-10-24 NOTE — [facility] Group Notes (Signed)
 Psychoeducational Group Note  Date:  10/24/2023 Time:  2000  Group Topic/Focus:  Alcoholics Anonymous Meeting  Participation Level: Did Not Attend  Participation Quality:  Not Applicable  Affect:  Not Applicable  Cognitive:  Not Applicable  Insight:  Not Applicable  Engagement in Group: Not Applicable  Additional Comments:  Did not attend.   Marcille Buffy 10/24/2023, 9:33 PM

## 2023-10-24 NOTE — Progress Notes (Signed)
 Pt has been accepted to [facility] on 10/24/2023 Bed assignment: 401-1   Pt meets inpatient criteria per: Dahlia Byes NP   Attending Physician will be: Phineas Inches, MD   Report can be called to: Adult unit: [phone]  Pt can arrive after (pending EKG can come tonight   Care Team Notified: [facility] Rona Ravens RN, Dahlia Byes NP,  Federica Woolwine RN,    [place] Oaklen Thiam LCSW-A   10/24/2023 11:19 AM

## 2023-10-24 NOTE — ED Notes (Signed)
 Patient requesting his cell phone to obtain his employer's phone number.  [employer] and public safety are assisting patient with getting the number from his phone

## 2023-10-24 NOTE — Progress Notes (Cosign Needed Addendum)
 RN reported to Clinical research associate that patient was asking for Oxycodone which is his home med. PDMP checked to verify this medication. As per the database, this med was last filled for 5 tabs on 03/26, prior to that, it was filled on 09/08/2023 for 5 tabs, and prior to that, it was filled on 06/10/2023 for 30 tabs. For all of the 3 encounters, patient saw 3 different providers for the, prescriptions above. I have informed RN that this seems to be a pattern of Oxycodone seeking behaviors, and that we will not be prescribing this medication during this hospitalization. Robaxin 750 mg PRN Q8H ordered, with an understanding that the patient will not be discharged on this medication. COWS not initiated since tox screen is negative for opiates. This is a new patient who is not yet assigned, and writer has not provided care to this patient, and only assisted  with orders at request of RN.

## 2023-10-24 NOTE — ED Notes (Signed)
 Belongings in locker 43, not 48

## 2023-10-24 NOTE — ED Provider Notes (Signed)
 [place] EMERGENCY DEPARTMENT AT [facility] Provider Note   CSN: [id number] Arrival date & time: 10/24/23  0013     History  Chief Complaint  Patient presents with   Paranoid         Jose Mitchell is a 43 y.o. male.  The history is provided by the patient and medical records.   43 year old male with history of GERD, anxiety, bipolar disorder, PTSD, paranoid schizophrenia, presenting to the ED for mental health evaluation.  Patient reports he has been on and off of several different medications over the past few years.  Recently his psychiatrist took him off of one of his anxiety medicines as it was a "controlled substance" and changed his other medications for unclear reasons.  He states now he feels out of control.  Reports worsening paranoia to the point that he feels unsafe in his own house.  He is experiencing auditory and visual hallucinations which seems to make it worse. Denies SI/HI.  Does admit to some recent cocaine use.  Home Medications Prior to Admission medications   Medication Sig Start Date End Date Taking? Authorizing Provider  chlorhexidine (HIBICLENS) 4 % external liquid Apply topically daily as needed. Mix a dime size drop of the solution with water and cleanse the affected area twice daily while symptoms persist. Patient not taking: Reported on 10/14/2023 09/05/23   Leath-Warren, Sadie Haber, NP  gabapentin (NEURONTIN) 300 MG capsule Take 1 capsule (300 mg total) by mouth 2 (two) times daily as needed. 11/09/22   [name], FNP  HYDROcodone-acetaminophen (NORCO/VICODIN) 5-325 MG tablet Take 1 tablet by mouth at bedtime as needed for severe pain (pain score 7-10). 09/08/23   [name], FNP  hydrOXYzine (VISTARIL) 25 MG capsule Take 1 capsule (25 mg total) by mouth at bedtime as needed for anxiety (sleep). Patient not taking: Reported on 10/14/2023 08/03/23   [name], FNP  ibuprofen (ADVIL) 800 MG tablet Take 1 tablet (800 mg total) by mouth  every 8 (eight) hours as needed. Take with food to prevent GI upset Patient not taking: Reported on 10/14/2023 08/25/23   [name], FNP  mupirocin ointment (BACTROBAN) 2 % Apply 1 Application topically 2 (two) times daily. Patient not taking: Reported on 10/14/2023 09/05/23   Leath-Warren, Sadie Haber, NP  omeprazole (PRILOSEC) 40 MG capsule Take 1 capsule (40 mg total) by mouth 2 (two) times daily. Patient not taking: Reported on 10/14/2023 10/04/22   [name], MD  pantoprazole (PROTONIX) 40 MG tablet Take 1 tablet (40 mg total) by mouth daily. 07/01/23 12/28/23  [name], FNP  PRAZOSIN HCL PO  06/14/23   [provider]  REXULTI 1 MG TABS tablet  06/14/23   [provider]  traZODone (DESYREL) 50 MG tablet Take 1 tablet (50 mg total) by mouth at bedtime. 08/19/23   Del Nigel Berthold, FNP  Vitamin D, Ergocalciferol, (DRISDOL) 1.25 MG (50000 UNIT) CAPS capsule Take 1 capsule (50,000 Units total) by mouth every 7 (seven) days. 08/25/23   [name], FNP      Allergies    Sulfamethoxazole, Trimethoprim, and Sulfamethoxazole-trimethoprim    Review of Systems   Review of Systems  Psychiatric/Behavioral:  Positive for hallucinations.   All other systems reviewed and are negative.   Physical Exam Updated Vital Signs BP (!) 145/88 (BP Location: Left Arm)   Pulse 85   Temp 98.5 F (36.9 C) (Oral)   Resp 18   SpO2 95%   Physical Exam Vitals  and nursing note reviewed.  Constitutional:      Appearance: He is well-developed.  HENT:     Head: Normocephalic and atraumatic.  Eyes:     Conjunctiva/sclera: Conjunctivae normal.     Pupils: Pupils are equal, round, and reactive to light.  Cardiovascular:     Rate and Rhythm: Normal rate and regular rhythm.     Heart sounds: Normal heart sounds.  Pulmonary:     Effort: Pulmonary effort is normal.     Breath sounds: Normal breath sounds.  Abdominal:     General: Bowel sounds are normal.      Palpations: Abdomen is soft.  Musculoskeletal:        General: Normal range of motion.     Cervical back: Normal range of motion.  Skin:    General: Skin is warm and dry.  Neurological:     Mental Status: He is alert and oriented to person, place, and time.  Psychiatric:     Comments: A little paranoid on exam, looking around often while speaking with me, does not appear to be responding to internal stimuli; denies SI/HI     ED Results / Procedures / Treatments   Labs (all labs ordered are listed, but only abnormal results are displayed) Labs Reviewed  CBC WITH DIFFERENTIAL/PLATELET - Abnormal; Notable for the following components:      Result Value   WBC 15.4 (*)    Neutro Abs 12.5 (*)    All other components within normal limits  COMPREHENSIVE METABOLIC PANEL WITH GFR - Abnormal; Notable for the following components:   Glucose, Bld 122 (*)    All other components within normal limits  RAPID URINE DRUG SCREEN, HOSP PERFORMED - Abnormal; Notable for the following components:   Cocaine POSITIVE (*)    All other components within normal limits  ETHANOL    EKG None  Radiology No results found.  Procedures Procedures    Medications Ordered in ED Medications  nicotine (NICODERM CQ - dosed in mg/24 hours) patch 21 mg (21 mg Transdermal Patch Applied 10/24/23 0145)  HYDROcodone-acetaminophen (NORCO/VICODIN) 5-325 MG per tablet 1 tablet (1 tablet Oral Given 10/24/23 0145)  LORazepam (ATIVAN) tablet 1 mg (1 mg Oral Given 10/24/23 0256)    ED Course/ Medical Decision Making/ A&P                                 Medical Decision Making Amount and/or Complexity of Data Reviewed Labs: ordered. ECG/medicine tests: ordered and independent interpretation performed.  Risk OTC drugs. Prescription drug management.   43 year old male presenting to the ED requesting psychiatric evaluation.  Reports psychiatrist recently changed his medication and now feels like he is "out of  whack".  He reports some hallucinations which are worsening his paranoia.  Now to the point of feeling unsafe at home.  Denies any SI/HI.  Labs were obtained--does have leukocytosis but no fever or other infectious symptoms.  Chemistry is reassuring.  Ethanol is negative.  UDS positive for cocaine.  Medically cleared.  TTS has evaluated and recommends IP treatment.  Patient is voluntary at this time.  Pharmacy has yet to review home meds, these will be continued once verified.  Final Clinical Impression(s) / ED Diagnoses Final diagnoses:  Paranoia (HCC)  Hallucinations    Rx / DC Orders ED Discharge Orders     None         Garlon Hatchet, PA-C  10/24/23 1610    Tilden Fossa, MD 10/24/23 [phone]
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
## 2023-10-24 NOTE — ED Notes (Signed)
 This Nurse received pt awake and walking to the bathroom. This nurse offered pt his 1600 meds pt took meds without any issues or conflict.

## 2023-10-24 NOTE — ED Triage Notes (Signed)
 Patient arrived stating he needs to see someone for mental health that he is dwelling on some people that may be after him and he can't stop thinking about it. No SI or HI. Admits cocaine use.

## 2023-10-24 NOTE — Progress Notes (Addendum)
 Admission Note:   34 yr Caucasian male who presents IVC in no acute distress for the treatment of Schizoaffective d/o present sx of anxiety and Depression and a recent change in his medications for and stated nit is not working.  Pt appears flat and depressed and slightly irritable due to "pain" 8/10. Pt requested Oxycodone on admission and when told it was not ordered, "they gave it to me @ the hospital". Pt was calm and cooperative with admission process. Pt presently denied SI/HI and contracts for safety upon admission. Pt denies A/V/H "at this time, but I do hear voices sometimes . Pt reports chronic lower back pain that is constant and had surgery in the past for "herniated disc" Pt stated he lives alone and do not like to be bother with people. Pt also reports having pre-diabetes and GERD. Skin was assessed and found to be clear of any abnormal marks apart from a surgical scar on lower back.and numerous tattoos to torso, back and lower extremities  PT searched and no contraband found, POC and unit policies explained and understanding verbalized. Consents for unit rules and policies obtained. Food and fluids offered, and he denied because he was not allowed to take it to his room.  Pt had no additional questions or concerns  except to get his pain managed, Doris, NP was notified of request. Pt was placed on q 15 min rounds for safety and support.

## 2023-10-25 LAB — VITAMIN B12: Vitamin B-12: 233 pg/mL (ref 180–914)

## 2023-10-25 LAB — HIV ANTIBODY (ROUTINE TESTING W REFLEX): HIV Screen 4th Generation wRfx: NONREACTIVE

## 2023-10-25 LAB — FOLATE: Folate: 11.2 ng/mL (ref >5.9)

## 2023-10-25 MED ORDER — VITAMIN D3 25 MCG PO TABS
2000.0000 [IU] | ORAL_TABLET | Freq: Every day | ORAL | Status: DC
  Administered 2023-10-25 – 2023-10-26 (×2): 2000 [IU] via ORAL
  Filled 2023-10-25 (×5): qty 2

## 2023-10-25 MED ORDER — NICOTINE 21 MG/24HR TD PT24
21.0000 mg | MEDICATED_PATCH | Freq: Every day | TRANSDERMAL | Status: DC
  Administered 2023-10-25 – 2023-10-26 (×2): 21 mg via TRANSDERMAL
  Filled 2023-10-25 (×4): qty 1

## 2023-10-25 NOTE — Group Note (Signed)
 Date:  10/25/2023 Time:  4:38 PM  Group Topic/Focus:  MHA Group/ Coping Skills    Participation Level:  Did Not Attend  Participation Quality:   n/a  Affect:   n/a  Cognitive:   n/a  Insight: None  Engagement in Group:   n/a  Modes of Intervention:   n/a  Additional Comments:   Did not attend.  [name] [name] Lauris Keepers 10/25/2023, 4:38 PM

## 2023-10-25 NOTE — Group Note (Signed)
 Date:  10/25/2023 Time:  10:36 PM  Group Topic/Focus:  Wrap-Up Group:   The focus of this group is to help patients review their daily goal of treatment and discuss progress on daily workbooks.    Participation Level:  Did Not Attend  Participation Quality:   Did Not Attend  Affect:   Did Not Attend  Cognitive:   Did Not Attend   Insight: None  Engagement in Group:   Did Not Attend  Modes of Intervention:   Did Not Attend  Additional Comments:  Pt was encouraged to attend wrap up group but did not attend.  Felipa Furnace 10/25/2023, 10:36 PM

## 2023-10-25 NOTE — Group Note (Signed)
 LCSW Group Therapy Note   Group Date: 10/25/2023 Start Time: 1100 End Time: 1200  Participation:  did not attend   Type of Therapy:  Group Therapy  Topic:  Understanding Your Path to Change   Objective:  The goal is to help individuals understand the stages of change, identify where they currently are in the process, and provide actionable next steps to continue moving forward in their journey of change.  Goals: Learn about the six stages of change:  Precontemplation, Contemplation, Preparation, Action, Maintenance, and Relapse Reflect on Current Change Efforts:  Recognize which stage participants are in regarding a personal change. Plan Next Steps for Moving Forward:  Create an action plan based on their current stage of change.  Summary:  In this session, we explored the Stages of Change as a framework to understand the process of change.  We discussed how each stage helps individuals recognize where they are in their personal journey and used the Stages of Change Worksheet for self-reflection. Participants answered questions to better understand their current stage, challenges, and progress. We also emphasized the importance of moving forward, even if setbacks (Relapse) occur, and created actionable steps to help participants continue progressing. By the end of the session, participants gained a clearer understanding of their path to change and left with a clear plan for next steps.  Therapeutic Modalities:  Elements of CBT (cognitive restructuring, problem solving)  Element of DBT (mindfulness, distress tolerance)   Winson Eichorn O Zach Tietje, LCSWA 10/25/2023  6:18 PM

## 2023-10-25 NOTE — Plan of Care (Signed)
   Problem: Education: Goal: Knowledge of [facility] Education information/materials will improve Outcome: Progressing Goal: Emotional status will improve Outcome: Progressing Goal: Mental status will improve Outcome: Progressing Goal: Verbalization of understanding the information provided will improve Outcome: Progressing   Problem: Safety: Goal: Periods of time without injury will increase Outcome: Progressing

## 2023-10-25 NOTE — Group Note (Signed)
 Recreation Therapy Group Note   Group Topic:Animal Assisted Therapy   Group Date: 10/25/2023 Start Time: 0950 End Time: 1030 Facilitators: Jemma Rasp-McCall, LRT,CTRS Location: [address] Dayroom   Animal-Assisted Activity (AAA) Program Checklist/Progress Notes Patient Eligibility Criteria Checklist & Daily Group note for Rec Tx Intervention  AAA/T Program Assumption of Risk Form signed by Patient/ or Parent Legal Guardian Yes  Patient understands his/her participation is voluntary Yes  Education: Charity fundraiser, Appropriate Animal Interaction   Education Outcome: Acknowledges education.    Affect/Mood: N/A   Participation Level: Did not attend    Clinical Observations/Individualized Feedback:      Plan: Continue to engage patient in RT group sessions 2-3x/week.   Kaegan Stigler-McCall, LRT,CTRS 10/25/2023 1:34 PM

## 2023-10-25 NOTE — Progress Notes (Signed)
   10/25/23 0900  Psych Admission Type (Psych Patients Only)  Admission Status Voluntary  Psychosocial Assessment  Patient Complaints Anxiety;Depression  Eye Contact Brief  Facial Expression Flat  Affect Flat  Speech Logical/coherent  Interaction Assertive  Motor Activity Slow  Appearance/Hygiene Unremarkable  Behavior Characteristics Cooperative;Appropriate to situation  Mood Anxious;Depressed  Thought Process  Coherency WDL  Content WDL  Delusions None reported or observed  Perception WDL  Hallucination None reported or observed  Judgment Poor  Confusion None  Danger to Self  Current suicidal ideation? Denies  Agreement Not to Harm Self Yes  Description of Agreement verbal  Danger to Others  Danger to Others None reported or observed

## 2023-10-25 NOTE — BH Assessment (Signed)
 Pt was in bed at beginning of shift. Pt was awakened for vital signs and meds.Pt states his back is hurting, facial grimise noted when sitting up on side of bed. Pt denies SI/HI and AVH. Denies depression and rates anxiety at 4/10. Pt was offered a heat pack for his back

## 2023-10-25 NOTE — Plan of Care (Signed)
  Problem: Education: Goal: Knowledge of [facility] Education information/materials will improve Outcome: Progressing Goal: Emotional status will improve Outcome: Progressing Goal: Mental status will improve Outcome: Progressing   Problem: Activity: Goal: Interest or engagement in activities will improve Outcome: Progressing Goal: Sleeping patterns will improve Outcome: Progressing   

## 2023-10-25 NOTE — Group Note (Signed)
 Date:  10/25/2023 Time:  11:33 AM  Group Topic/Focus:  Goals Group:   The focus of this group is to help patients establish daily goals to achieve during treatment and discuss how the patient can incorporate goal setting into their daily lives to aide in recovery. Orientation:   The focus of this group is to educate the patient on the purpose and policies of crisis stabilization and provide a format to answer questions about their admission.  The group details unit policies and expectations of patients while admitted.    Participation Level:  Did Not Attend  Participation Quality:   n/a  Affect:   n/a  Cognitive:   n/a  Insight: None  Engagement in Group:   n/a  Modes of Intervention:   n/a  Additional Comments:   Did not attend.  [name] [name] [name] 10/25/2023, 11:33 AM

## 2023-10-25 NOTE — Progress Notes (Signed)
   10/25/23 0047  Psych Admission Type (Psych Patients Only)  Admission Status Voluntary  Psychosocial Assessment  Patient Complaints Anxiety;Depression  Eye Contact Brief  Facial Expression Flat  Affect Flat  Speech Logical/coherent  Interaction Assertive  Motor Activity Slow  Behavior Characteristics Cooperative;Appropriate to situation  Mood Depressed;Anxious  Thought Process  Coherency WDL  Content WDL  Delusions WDL  Perception WDL  Hallucination None reported or observed  Judgment WDL  Confusion WDL  Danger to Self  Current suicidal ideation? Denies  Agreement Not to Harm Self Yes  Description of Agreement verbal  Danger to Others  Danger to Others None reported or observed

## 2023-10-25 NOTE — H&P (Signed)
 Psychiatric Admission Assessment Adult  Patient Identification: Jose Mitchell MRN:  [id number] Date of Evaluation:  10/25/2023  Chief Complaint:  Bipolar I disorder, current or most recent episode depressed, with psychotic features with peripartum onset (HCC) [F31.5],  Bipolar I disorder, current or most recent episode depressed, with psychotic features with peripartum onset (HCC)  Principal Problem:   Bipolar I disorder, current or most recent episode depressed, with psychotic features with peripartum onset (HCC)   History of Present Illness:   Jose Mitchell is a 43 y.o., male with past psychiatric history of anxiety, schizoaffective disorder, and cocaine use disorder who presents to the [facility] from [facility] Emergency Department for evaluation and management of paranoia and auditory/visual hallucinations.   On assessment today, patient reports that he used "some cocaine" on last Sunday (10/23/2023) after which he started feeling really paranoid and felt like someone was watching him and that someone was after him. He states he arrived at  [facility] Emergency Department as he felt that his symptoms were worsening and he began to see shadows and hear random voices. He was unable to make out what the voices were saying but he could hear them clearly. He states he felt out of control and that his happens every time he uses cocaine. He reports another instance in the distant past where a similar situation occurred after he used cocaine. He states that these symptoms occur without the use of cocaine as well but have not occurred in a while due to his medications and medication adherence. He describes that there was some recent changes in his medication but cannot describe exactly what changes other than saying " my medications went up". He denies SI or HI in the time when he was hearing voices and seeing shadows. Today he states that he is no longer experiencing any  auditory or visual hallucinations and attributes it to coming off of the cocaine.   He reports symptoms suggestive of depression such as sleep changes, low energy, low interests in hobbies, and feelings of guilt.. Patient denies symptoms suggestive of mania such as intermittent elevated mood, "high highs and low lows", and impulsivity. Patient does not appear to be reacting to internal stimuli. Patient denies AVH. Denies access to weapons/firearms. He also denies SI and HI. He states that he is ready go home. He denies any new stressors or changes in his life. He denies symptoms of anxiety such as excessive worrying or muscle stiffness.  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   Chart review: On chart review, prior to this evaluation, patient's last presentation to the ED for symptoms related to schizoaffective disorder was on 01/17/2022 . He also has many chronic medical conditions and has attempted to obtain oxycodone from many providers in the past few months. As per the PDMP database, this med was last filled for 5 tabs on 03/26, prior to that, it was filled on 09/08/2023 for 5 tabs, and prior to that, it was filled on 06/10/2023 for 30 tabs. For all of the 3 encounters, patient saw 3 different providers.  Subjective Sleep past 24 hours: fair Subjective Appetite past 24 hours: good  Collateral: Patient denies obtaining collateral from mother and states that they do not know about this and that he would like to make sure that they remain unaware about his condition.   Past Psychiatric History:  Previous psych diagnoses:  - Bipolar Disorder  -  Cocaine Use Disorder - PTSD - Schizoaffective Disorder  - Schizophrenia  - Anxiety  Prior inpatient psychiatric treatment: Last presentation to the ED was on 01/17/2022 for symptoms related to schizoaffective disorder. He also has many chronic medical conditions and has attempted to obtain oxycodone  from many providers in the past few months.   Prior outpatient psychiatric treatment: Juleen China, NP at [facility] Current psychiatric provider: Juleen China, NP at [facility]  Neuromodulation history: not assessed and denies  Current therapist: Lorrin Goodell at [facility] Psychotherapy hx: Lorrin Goodell at [facility]  History of suicide attempts: Denies History of homicide: Denies  Psychotropic medications: Current Gabapentin 300mg  twice daily - patient reportedly taking consistently, reports fair response Prazosin HCl - patient reportedly taking consistently, reports fair response Trazodone 50 mg - patient reportedly taking consistently, reports fair response Past Rexulti 1 mg  Hydroxyzine 25mg   Zyprexa 10mg    Substance Use History: Alcohol: tried in past, not currently drinking - last drink 7 years ago Hx withdrawal tremors/shakes: denies Hx alcohol related blackouts: denies Hx alcohol induced hallucinations: denies Hx alcoholic seizures: denies Hx medical hospitalization due to severe alcohol withdrawal symptoms: denies DUI: denies  --------  Tobacco: 1/2 pack/day since the age of 34 but quit for 3 years in between and restarted recently. Cannabis (marijuana): tried in past, very minimal, no current use Cocaine: Does not use often but last use was Sunday Methamphetamines: never tried Psilocybin (mushrooms): never tried Ecstasy (MDMA / molly): never tried LSD (acid): never tried Opiates (fentanyl / heroin): never tried Benzos (Xanax, Klonopin): never tried IV drug use: denies Prescribed meds abuse: denies  History of detox: denies History of rehab: denies  Is the patient at risk to self? No Has the patient been a risk to self in the past 6 months? No Has the patient been a risk to self within the distant past? No Is the patient a risk to others? No Has the patient been a risk to others in the past 6 months? No Has the patient been a  risk to others within the distant past? No  Alcohol Screening: 1. How often do you have a drink containing alcohol?: Never 2. How many drinks containing alcohol do you have on a typical day when you are drinking?: 1 or 2 3. How often do you have six or more drinks on one occasion?: Never AUDIT-C Score: 0 4. How often during the last year have you found that you were not able to stop drinking once you had started?: Never 5. How often during the last year have you failed to do what was normally expected from you because of drinking?: Never 6. How often during the last year have you needed a first drink in the morning to get yourself going after a heavy drinking session?: Never 7. How often during the last year have you had a feeling of guilt of remorse after drinking?: Never 8. How often during the last year have you been unable to remember what happened the night before because you had been drinking?: Never 9. Have you or someone else been injured as a result of your drinking?: No 10. Has a relative or friend or a doctor or another health worker been concerned about your drinking or suggested you cut down?: No Alcohol Use Disorder Identification Test Final Score (AUDIT): 0 Tobacco Screening:    Substance Abuse History in the last 12 months: Yes  Allergies:  - Reports allergy to Sulf Drugs but cannot describe  reaction.  Past Medical/Surgical History:  Medical Diagnoses:  - GERD - Herniated Disc  - Chronic Back and shoulder pain Home Rx:  Gabapentin 300mg  twice daily - patient reportedly taking consistently, reports fair response Pantoprazole 40mg  once daily - - patient reportedly taking consistently, reports fair response Prazosin HCl - patient reportedly taking consistently, reports fair response Trazodone 50 mg - patient reportedly taking consistently, reports fair response Vitamin D 50,000 Units - patient reportedly taking consistently, reports fair response NORCO/VICODIN - patient  reportedly taking consistently, reports fair response Prior Hosp: denies Prior Surgeries / non-head trauma:  - Back Surgery for herniated disc in May 2024  Head trauma: denies LOC: denies Concussions: denies Seizures: endorses as child but stopped a long time ago and cannot remember when his last seizure was.  Family History:  Medical:  Father - Type 2 Diabetes Mellitus Psych: denies Psych Rx: denies Suicide: denies Homicide: denies Substance use family hx: denies  Social History:  Place of birth and grew up where: Born and raised in [place], [place] Abuse: no history of abuse Marital Status: partnered Sexual orientation: straight Children: 0 Employment: employed at a factory working in [employer] level of education: GED Housing: living alone, rents  Finances: employment income Legal: patient endorsed previous incarceration but refused to elaborate further Military: never served 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 Pills stockpile: denies  Lab Results:  Results for orders placed or performed during the hospital encounter of 10/24/23 (from the past 48 hours)  CBC with Differential     Status: Abnormal   Collection Time: 10/24/23  2:01 AM  Result Value Ref Range   WBC 15.4 (H) 4.0 - 10.5 K/uL   RBC 5.35 4.22 - 5.81 MIL/uL   Hemoglobin 15.9 13.0 - 17.0 g/dL   HCT 16.1 09.6 - 04.5 %   MCV 86.9 80.0 - 100.0 fL   MCH 29.7 26.0 - 34.0 pg   MCHC 34.2 30.0 - 36.0 g/dL   RDW 40.9 81.1 - 91.4 %   Platelets 291 150 - 400 K/uL   nRBC 0.0 0.0 - 0.2 %   Neutrophils Relative % 82 %   Neutro Abs 12.5 (H) 1.7 - 7.7 K/uL   Lymphocytes Relative 12 %   Lymphs Abs 1.9 0.7 - 4.0 K/uL   Monocytes Relative 6 %   Monocytes Absolute 0.9 0.1 - 1.0 K/uL   Eosinophils Relative 0 %   Eosinophils Absolute 0.0 0.0 - 0.5 K/uL   Basophils Relative 0 %   Basophils Absolute 0.1 0.0 - 0.1 K/uL   Immature Granulocytes 0 %   Abs Immature Granulocytes 0.06 0.00 - 0.07 K/uL    Comment:  Performed at [facility], [address] [place], [place] [address]  Comprehensive metabolic panel     Status: Abnormal   Collection Time: 10/24/23  2:01 AM  Result Value Ref Range   Sodium 138 135 - 145 mmol/L   Potassium 3.9 3.5 - 5.1 mmol/L   Chloride 103 98 - 111 mmol/L   CO2 23 22 - 32 mmol/L   Glucose, Bld 122 (H) 70 - 99 mg/dL    Comment: Glucose reference range applies only to samples taken after fasting for at least 8 hours.   BUN 12 6 - 20 mg/dL   Creatinine, Ser 6.21 0.61 - 1.24 mg/dL   Calcium 9.6 8.9 - 30.8 mg/dL   Total Protein 7.5 6.5 - 8.1 g/dL   Albumin 4.5 3.5 - 5.0 g/dL  AST 26 15 - 41 U/L   ALT 28 0 - 44 U/L   Alkaline Phosphatase 45 38 - 126 U/L   Total Bilirubin 0.6 0.0 - 1.2 mg/dL   GFR, Estimated >16 >10 mL/min    Comment: (NOTE) Calculated using the CKD-EPI Creatinine Equation (2021)    Anion gap 12 5 - 15    Comment: Performed at [facility], [address], [place], [place] [address]  Ethanol     Status: None   Collection Time: 10/24/23  2:01 AM  Result Value Ref Range   Alcohol, Ethyl (B) <10 <10 mg/dL    Comment: (NOTE) Lowest detectable limit for serum alcohol is 10 mg/dL.  For medical purposes only. Performed at [facility], [address], [place], [place] [address]   Rapid urine drug screen (hospital performed)     Status: Abnormal   Collection Time: 10/24/23  2:13 AM  Result Value Ref Range   Opiates NONE DETECTED NONE DETECTED   Cocaine POSITIVE (A) NONE DETECTED   Benzodiazepines NONE DETECTED NONE DETECTED   Amphetamines NONE DETECTED NONE DETECTED   Tetrahydrocannabinol NONE DETECTED NONE DETECTED   Barbiturates NONE DETECTED NONE DETECTED    Comment: (NOTE) DRUG SCREEN FOR MEDICAL PURPOSES ONLY.  IF CONFIRMATION IS NEEDED FOR ANY PURPOSE, NOTIFY LAB WITHIN 5 DAYS.  LOWEST DETECTABLE LIMITS FOR URINE DRUG SCREEN Drug Class                     Cutoff  (ng/mL) Amphetamine and metabolites    1000 Barbiturate and metabolites    200 Benzodiazepine                 200 Opiates and metabolites        300 Cocaine and metabolites        300 THC                            50 Performed at [facility], [address] [place], [place] [address]     Blood Alcohol level:  Lab Results  Component Value Date   [facility] <10 10/24/2023   ETH <10 02/22/2018    Metabolic Disorder Labs:  Lab Results  Component Value Date   HGBA1C 6.1 (H) 10/14/2023   No results found for: "PROLACTIN" Lab Results  Component Value Date   CHOL 167 10/14/2023   TRIG 115 10/14/2023   HDL 40 10/14/2023   CHOLHDL 4.2 10/14/2023   LDLCALC 106 (H) 10/14/2023   LDLCALC 102 (H) 10/28/2022    Current Medications: Current Facility-Administered Medications  Medication Dose Route Frequency Provider Last Rate Last Admin   acetaminophen (TYLENOL) tablet 650 mg  650 mg Oral Q6H PRN Dahlia Byes C, NP   650 mg at 10/25/23 0917   alum & mag hydroxide-simeth (MAALOX/MYLANTA) 200-200-20 MG/5ML suspension 30 mL  30 mL Oral Q4H PRN Onuoha, Josephine C, NP       haloperidol (HALDOL) tablet 5 mg  5 mg Oral TID PRN Dahlia Byes C, NP       And   diphenhydrAMINE (BENADRYL) capsule 50 mg  50 mg Oral TID PRN Dahlia Byes C, NP       haloperidol lactate (HALDOL) injection 5 mg  5 mg Intramuscular TID PRN Dahlia Byes C, NP       And   diphenhydrAMINE (BENADRYL) injection 50 mg  50 mg Intramuscular TID PRN Onuoha,  Hessie Dibble, NP       And   LORazepam (ATIVAN) injection 2 mg  2 mg Intramuscular TID PRN Dahlia Byes C, NP       haloperidol lactate (HALDOL) injection 10 mg  10 mg Intramuscular TID PRN Dahlia Byes C, NP       And   diphenhydrAMINE (BENADRYL) injection 50 mg  50 mg Intramuscular TID PRN Earney Navy, NP       And   LORazepam (ATIVAN) injection 2 mg  2 mg Intramuscular TID PRN Dahlia Byes C, NP       gabapentin  (NEURONTIN) capsule 300 mg  300 mg Oral TID Dahlia Byes C, NP   300 mg at 10/25/23 1141   methocarbamol (ROBAXIN) tablet 750 mg  750 mg Oral Q8H PRN Starleen Blue, NP   750 mg at 10/25/23 4098   nicotine (NICODERM CQ - dosed in mg/24 hours) patch 21 mg  21 mg Transdermal Once Dahlia Byes C, NP   21 mg at 10/24/23 2156   nicotine (NICODERM CQ - dosed in mg/24 hours) patch 21 mg  21 mg Transdermal Daily Massengill, Harrold Donath, MD   21 mg at 10/25/23 1023   pantoprazole (PROTONIX) EC tablet 40 mg  40 mg Oral Daily Dahlia Byes C, NP   40 mg at 10/25/23 0809   prazosin (MINIPRESS) capsule 2 mg  2 mg Oral QHS Onuoha, Josephine C, NP   2 mg at 10/24/23 2155   risperiDONE (RISPERDAL) tablet 2 mg  2 mg Oral QHS Onuoha, Josephine C, NP   2 mg at 10/24/23 2154   traZODone (DESYREL) tablet 100 mg  100 mg Oral QHS Onuoha, Josephine C, NP   100 mg at 10/24/23 2154   vitamin D3 (CHOLECALCIFEROL) tablet 2,000 Units  2,000 Units Oral Daily Golda Acre, MD   2,000 Units at 10/25/23 1191    PTA Medications: Medications Prior to Admission  Medication Sig Dispense Refill Last Dose/Taking   clonazePAM (KLONOPIN) 1 MG tablet Take 1 mg by mouth 3 (three) times daily as needed for anxiety.      gabapentin (NEURONTIN) 300 MG capsule Take 1 capsule (300 mg total) by mouth 2 (two) times daily as needed. (Patient taking differently: Take 300 mg by mouth 3 (three) times daily.) 60 capsule 1    HYDROcodone-acetaminophen (NORCO/VICODIN) 5-325 MG tablet Take 1 tablet by mouth at bedtime as needed for severe pain (pain score 7-10). 20 tablet 0    pantoprazole (PROTONIX) 40 MG tablet Take 1 tablet (40 mg total) by mouth daily. 60 tablet 2    prazosin (MINIPRESS) 2 MG capsule Take 2 mg by mouth at bedtime.      REXULTI 2 MG TABS tablet Take 2 mg by mouth daily.      traZODone (DESYREL) 100 MG tablet Take 100 mg by mouth at bedtime.      Vitamin D, Ergocalciferol, (DRISDOL) 1.25 MG (50000 UNIT) CAPS capsule Take  1 capsule (50,000 Units total) by mouth every 7 (seven) days. 5 capsule 1     Physical Findings: AIMS: No  CIWA:    COWS:     Psychiatric Specialty Exam: General Appearance: Disheveled   Eye Contact: Fair  Speech: Fair, mumbling   Volume: good  Mood: "I feel fine"  Affect: depressed, anxious  Thought Content: delusions  Suicidal Thoughts: denies  Homicidal Thoughts: denies  Thought Process: linear  Orientation: Full orientation (person, place, time, situation)     Memory: Immediate Good; Recent  Good; Remote poor    Judgment: poor    Insight: Fair    Concentration: Fair    Recall: [employer] of Knowledge: Fair    Language: Fair    Psychomotor Activity: decreased, slow  Assets: Manufacturing systems engineer; Desire for Improvement; Financial Resources/Insurance; Housing; Leisure Time; Physical Health; Social Support; Vocational/Educational; Transportation   Sleep: Poor - sleep average for 4 hours per night for as long as he can remember.   Review of Systems Review of Systems  Psychiatric/Behavioral:  Positive for depression, hallucinations and substance abuse. Negative for memory loss and suicidal ideas. The patient is nervous/anxious and has insomnia.     Vital signs: Blood pressure 100/64, pulse 72, temperature 98 F (36.7 C), temperature source Oral, resp. rate 16, height 6\' 3"  (1.905 m), weight 99.3 kg, SpO2 96%. Body mass index is 27.37 kg/m. Physical Exam Pulmonary:     Effort: Pulmonary effort is normal.  Musculoskeletal:        General: Normal range of motion.     Cervical back: Normal range of motion.  Neurological:     Mental Status: He is alert and oriented to person, place, and time.  Psychiatric:        Attention and Perception: Attention normal.        Mood and Affect: Mood is anxious. Affect is flat.        Speech: Speech normal.        Behavior: Behavior is slowed.        Thought Content: Thought content is paranoid and delusional.         Cognition and Memory: He exhibits impaired remote memory.        Judgment: Judgment normal.     Assets  Assets: Communication Skills; Desire for Improvement; Financial Resources/Insurance; Housing; Leisure Time; Physical Health; Social Support; Ambulance person Summary: Daily contact with patient to assess and evaluate symptoms and progress in treatment and medication management  ASSESSMENT: Rainen Vanrossum is a 43 y.o., male with past psychiatric history of anxiety, schizoaffective disorder, and cocaine use disorder who presents for evaluation and management of paranoia and auditory/visual hallucinations.  On assessment today, patient reports that he used "some cocaine" on last Sunday (10/23/2023) after which he started feeling really paranoid and felt like someone was watching him and that someone was after him. He began to see shadows and hear random voices. He states he felt out of control and that his happens every time he uses cocaine. He describes that there was some recent changes in his medication but cannot describe exactly what changes other than saying " my medications went up". He denies SI or HI in the time when he was hearing voices and seeing shadows. Today, he states that he is no longer experiencing any auditory or visual hallucinations.  He reports symptoms suggestive of depression and mania. Patient does not appear to be reacting to internal stimuli. Patient denies AVH, SI and HI. He denies any new stressors or changes in his life. He denies symptoms of anxiety.  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   UDS was positive for cocaine. Start him on Risperdal 2 mg for psychosis, prazosin 2 mg at bedtime for sleep and PTSD, Trazodone 100 mg at bedtime for sleep. Provide nicotine patch for tobacco use. Will monitor for withdrawal symptoms from substances such as cocaine and tobacco. Will encourage participation in group sessions. Will  utilize social work for [employer] and  additional community resources.   Differential:  Substance induced psychosis, cocaine Schizoaffective disorder exacerbation  PLAN: Safety and Monitoring:  -- Voluntary admission to inpatient psychiatric unit for safety, stabilization and treatment  -- Daily contact with patient to assess and evaluate symptoms and progress in treatment  -- Patient's case to be discussed in multi-disciplinary team meeting  -- Observation Level : q15 minute checks  -- Vital signs: q12 hours  -- Precautions: suicide, elopement, and assault  2. Interventions (medications, psychoeducation, etc):    -- Patient in need of nicotine replacement; nicotine patch 21 mg / 24 hours ordered. Smoking cessation encouraged  -- Start him on Risperdal 2mg  for psychosis -- prazosin 2mg  at bedtime for sleep and PTSD -- Trazodone 100 mg at bedtime for sleep.   PRN medications for symptomatic management:      -- start acetaminophen 650 mg every 6 hours as needed for mild to moderate pain, fever, and headaches              -- start hydroxyzine 25 mg three times a day as needed for anxiety              -- start bismuth subsalicylate 524 mg oral chewable tablet every 3 hours as needed for indigestion              -- start senna 8.6 mg oral at bedtime as needed and polyethylene glycol 17 g oral daily as needed for mild to moderate constipation              -- start ondansetron 8 mg every 8 hours as needed for nausea or vomiting              -- start aluminum-magnesium hydroxide + simethicone 30 mL every 4 hours as needed for heartburn  -- As needed agitation protocol in-place  The risks/benefits/side-effects/alternatives to the above medication were discussed in detail with the patient and time was given for questions. The patient consents to medication trial. FDA black box warnings, if present, were discussed.  The patient is agreeable with the medication plan, as above. We will  monitor the patient's response to pharmacologic treatment, and adjust medications as necessary.  3. Routine and other pertinent labs: EKG monitoring: QTc: 404  Metabolism / endocrine: BMI: Body mass index is 27.37 kg/m. Prolactin: No results found for: "PROLACTIN" Lipid Panel: Lab Results  Component Value Date   CHOL 167 10/14/2023   TRIG 115 10/14/2023   HDL 40 10/14/2023   CHOLHDL 4.2 10/14/2023   LDLCALC 106 (H) 10/14/2023   LDLCALC 102 (H) 10/28/2022   HbgA1c: Hgb A1c MFr Bld (%)  Date Value  10/14/2023 6.1 (H)   TSH: TSH (uIU/mL)  Date Value  10/14/2023 0.522    Drugs of Abuse     Component Value Date/Time   LABOPIA NONE DETECTED 10/24/2023 0213   COCAINSCRNUR POSITIVE (A) 10/24/2023 0213   LABBENZ NONE DETECTED 10/24/2023 0213   AMPHETMU NONE DETECTED 10/24/2023 0213   THCU NONE DETECTED 10/24/2023 0213   LABBARB NONE DETECTED 10/24/2023 0213     4. Group Therapy:  -- Encouraged patient to participate in unit milieu and in scheduled group therapies   -- Short Term Goals: Ability to identify changes in lifestyle to reduce recurrence of condition, verbalize feelings, identify and develop effective coping behaviors, maintain clinical measurements within normal limits, and identify triggers associated with substance abuse/mental health issues will improve. Improvement in ability to disclose and discuss suicidal ideas, demonstrate self-control, and  comply with prescribed medications.  -- Long Term Goals: Improvement in symptoms so as ready for discharge -- Patient is encouraged to participate in group therapy while admitted to the psychiatric unit. -- We will address other chronic and acute stressors, which contributed to the patient's Bipolar I disorder, current or most recent episode depressed, with psychotic features with peripartum onset (HCC) in order to reduce the risk of self-harm at discharge.  5. Discharge Planning:   -- Social work and case management to  assist with discharge planning and identification of hospital follow-up needs prior to discharge  -- Estimated LOS: 10/26/2023  -- Discharge Concerns: Need to establish a safety plan; Medication compliance and effectiveness  -- Discharge Goals: Return home with outpatient referrals for mental health follow-up including medication management/psychotherapy  I certify that inpatient services furnished can reasonably be expected to improve the patient's condition.   Signed: Gaylord Shih, Medical Student 10/25/2023, 12:51 PM

## 2023-10-26 ENCOUNTER — Encounter (HOSPITAL_COMMUNITY): Payer: Self-pay | Admitting: Nurse Practitioner

## 2023-10-26 ENCOUNTER — Other Ambulatory Visit: Payer: Self-pay | Admitting: Family Medicine

## 2023-10-26 LAB — RPR: RPR Ser Ql: NONREACTIVE

## 2023-10-26 MED ORDER — TRAZODONE HCL 100 MG PO TABS
100.0000 mg | ORAL_TABLET | Freq: Every day | ORAL | 0 refills | Status: DC
Start: 2023-10-26 — End: 2023-11-14

## 2023-10-26 MED ORDER — GABAPENTIN 300 MG PO CAPS
300.0000 mg | ORAL_CAPSULE | Freq: Three times a day (TID) | ORAL | 0 refills | Status: DC
Start: 2023-10-26 — End: 2023-11-14

## 2023-10-26 MED ORDER — PRAZOSIN HCL 2 MG PO CAPS: 2.0000 mg | ORAL_CAPSULE | Freq: Every day | ORAL | 0 refills | Status: DC

## 2023-10-26 MED ORDER — CHOLECALCIFEROL 125 MCG (5000 UT) PO TABS: 1.0000 | ORAL_TABLET | Freq: Every day | ORAL | 0 refills | Status: DC

## 2023-10-26 MED ORDER — RISPERIDONE 2 MG PO TABS: 2.0000 mg | ORAL_TABLET | Freq: Every day | ORAL | 0 refills | Status: DC

## 2023-10-26 NOTE — Progress Notes (Addendum)
 Patient denies SI/HI/AVH this morning. Pt rates their depression a 3/10 and anxiety a 3/10. Pt reports that they slept "good" last night. Pt has been interactive on the unit and participating in groups throughout the day. Pt has been calm and cooperative throughout the day. Patient has been compliant with medications and treatment plan. Q 15 minute safety checks are in place for patient's safety. Patient is currently safe on the unit.   10/26/23 0919  Psych Admission Type (Psych Patients Only)  Admission Status Voluntary  Psychosocial Assessment  Patient Complaints None  Eye Contact Fair  Facial Expression Flat  Affect Appropriate to circumstance  Speech Logical/coherent  Interaction Minimal  Motor Activity Slow  Appearance/Hygiene Unremarkable  Behavior Characteristics Cooperative  Mood Pleasant  Thought Process  Coherency WDL  Content WDL  Delusions None reported or observed  Perception WDL  Hallucination None reported or observed  Judgment Impaired  Confusion None  Danger to Self  Current suicidal ideation? Denies  Agreement Not to Harm Self Yes  Description of Agreement verbal  Danger to Others  Danger to Others None reported or observed

## 2023-10-26 NOTE — Progress Notes (Signed)
 Patient discharged from [facility] on 10/26/23 at 12:20pm. Patient denies SI, plan, and intention. Suicide safety plan completed, reviewed with this RN, given to the patient, and a copy in the chart. Patient denies HI/AVH upon discharge. Patient is alert, oriented, and cooperative. RN provided patient with discharge paperwork and reviewed information with patient. Patient expressed that he understood all of the discharge instructions. Pt was satisfied with belongings returned to him from the locker and at bedside. Discharged patient to [facility] waiting room. Pt's taxi driver awaiting patient in the [facility] waiting room.

## 2023-10-26 NOTE — Progress Notes (Signed)
  [facility] Adult Case Management Discharge Plan :  Will you be returning to the same living situation after discharge:  Yes,  pt returning home  At discharge, do you have transportation home?: CSW arranged [employer] for 1200PM Do you have the ability to pay for your medications: Yes,  pt has private insurance  Release of information consent forms completed and in the chart;  Patient's signature needed at discharge.  Patient to Follow up at:  Follow-up Information     [facility] Follow up on 11/14/2023.   Specialty: Behavioral Health Why: You have an appointment scheduled for 11/14/23 at 1:00 pm for medication management services, Virtual.  You also have an appointment with your provider for therapy services on 11/24/23 at 10:00 am, Virtual.  * Please submit the new patient paperwork that was sent to you, prior to your first appointment. Contact information: [address] [place] [address] [phone]        Vesta Mixer. Call on 10/27/2023.   Why: Please call this provider if you wish to schedule a hospital follow up appointment for therapy and/or medication management services.  If you do so, please call and cancel the other appointments. Contact information: [address]  [address] [place] [address] [phone]         [facility] Follow up.   Why: Please follow up with your primary care provider at discharge to continue with routine appointments. Contact information: [address] [place] [address] [phone]                Next level of care provider has access to [facility] Link:no  Safety Planning and Suicide Prevention discussed: Yes,  completed with patient, lives alone     Has patient been referred to the Quitline?: Yes, faxed/e-referral on 10/26/23  Patient has been referred for addiction treatment: No known substance use  disorder.  Kathi Der, LCSWA 10/26/2023, 9:34 AM

## 2023-10-26 NOTE — [facility] Suicide Risk Assessment (Signed)
 [facility] INPATIENT:  Family/Significant Other Suicide Prevention Education  Suicide Prevention Education:  Patient Refusal for Family/Significant Other Suicide Prevention Education: The patient Jose Mitchell has refused to provide written consent for family/significant other to be provided Family/Significant Other Suicide Prevention Education during admission and/or prior to discharge.  Physician notified.  Suicide Prevention Education was reviewed thoroughly with patient, including risk factors, warning signs, and what to do. Mobile Crisis services were described and that telephone number pointed out, with encouragement to patient to put this number in personal cell phone. Brochure was provided to patient to share with natural supports. Patient acknowledged the ways in which they are at risk, and how working through each of their issues can gradually start to reduce their risk factors. Patient was encouraged to think of the information in the context of people in their own lives. Patient denied having access to firearms Patient verbalized understanding of information provided. Patient endorsed a desire to live.    Kathi Der 10/26/2023, 9:30 AM
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
## 2023-10-26 NOTE — Discharge Summary (Signed)
 Medical Student  Discharge Summary Note  Patient:  Jose Mitchell is a 43 y.o. male  MRN:  [id number]  DOB:  [birth year]  Patient phone: [phone] (home)  Patient address:   [address] [place] [address]   Total Time spent with patient: 31 Minutes  Date of Admission:  10/24/2023  Date of Discharge: 10/26/23   Reason for Admission:    Jose Mitchell is a 43 y.o., male with past psychiatric history of anxiety, schizoaffective disorder, and cocaine use disorder who presents to the [facility] Voluntary from [facility] Emergency Department for evaluation and management of paranoia and auditory/visual hallucinations.    On assessment, patient reports that he used "some cocaine" on last Sunday (10/23/2023) after which he started feeling really paranoid and felt like someone was watching him and that someone was after him. He states he arrived at  [facility] Emergency Department as he felt that his symptoms were worsening and he began to see shadows and hear random voices. He was unable to make out what the voices were saying but he could hear them clearly. He states he felt out of control and that his happens every time he uses cocaine. He reports another instance in the distant past where a similar situation occurred after he used cocaine. He states that these symptoms occur without the use of cocaine as well but have not occurred in a while due to his medications and medication adherence. He describes that there was some recent changes in his medication but cannot describe exactly what changes other than saying " my medications went up". He denies SI or HI in the time when he was hearing voices and seeing shadows. Today he states that he is no longer experiencing any auditory or visual hallucinations and attributes it to coming off of the cocaine.    He reports symptoms suggestive of depression such as sleep changes, low energy, low interests in hobbies, and feelings of  guilt.. Patient denies symptoms suggestive of mania such as intermittent elevated mood, "high highs and low lows", and impulsivity. Patient does not appear to be reacting to internal stimuli. Patient denies AVH. Denies access to weapons/firearms. He also denies SI and HI. He states that he is ready go home. He denies any new stressors or changes in his life. He denies symptoms of anxiety such as excessive worrying or muscle stiffness.  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     Principal Problem: Bipolar I disorder, current or most recent episode depressed, with psychotic features with peripartum onset [facility])  Discharge Diagnoses: Principal Problem:   Bipolar I disorder, current or most recent episode depressed, with psychotic features with peripartum onset [facility])     Past Psychiatric (and medical) History: Jose Mitchell  has a past medical history of Alcohol abuse, Bipolar 1 disorder (HCC), Cocaine abuse (HCC), and Schizophrenia (HCC).   Past Medical History:  Past Medical History:  Diagnosis Date   Alcohol abuse    Bipolar 1 disorder (HCC)    Cocaine abuse (HCC)    Schizophrenia (HCC)      No past surgical history on file.   Family History:  Family History  Family history unknown: Yes     Family Psychiatric  History:   Medical:  Father - Type 2 Diabetes Mellitus Psych: denies Psych Rx: denies Suicide: denies Homicide: denies Substance use family hx: denies  Social History:   Place of  birth and grew up where: Born and raised in [place], [place] Abuse: no history of abuse Marital Status: partnered Sexual orientation: straight Children: 0 Employment: employed at a factory working in [employer] level of education: GED Housing: living alone, rents  Finances: employment income Legal: patient endorsed previous incarceration but refused to elaborate further Hotel manager: never served Consulting civil engineer: denies owning any  firearms Pills stockpile: denies   Social History   Substance and Sexual Activity  Alcohol Use Not Currently     Social History   Substance and Sexual Activity  Drug Use Not Currently   Types: Cocaine     Social History   Socioeconomic History   Marital status: Single    Spouse name: Not on file   Number of children: Not on file   Years of education: Not on file   Highest education level: GED or equivalent  Occupational History   Not on file  Tobacco Use   Smoking status: Former    Current packs/day: 0.00    Types: Cigarettes    Quit date: 2021    Years since quitting: 4.2   Smokeless tobacco: Current    Types: Chew  Vaping Use   Vaping status: Never Used  Substance and Sexual Activity   Alcohol use: Not Currently   Drug use: Not Currently    Types: Cocaine   Sexual activity: Not Currently  Other Topics Concern   Not on file  Social History Narrative   Not on file   Social Drivers of Health   Financial Resource Strain: High Risk (08/19/2023)   Overall Financial Resource Strain (CARDIA)    Difficulty of Paying Living Expenses: Very hard  Food Insecurity: No Food Insecurity (10/24/2023)   Hunger Vital Sign    Worried About Running Out of Food in the Last Year: Never true    Ran Out of Food in the Last Year: Never true  Recent Concern: Food Insecurity - Food Insecurity Present (08/19/2023)   Hunger Vital Sign    Worried About Running Out of Food in the Last Year: Sometimes true    Ran Out of Food in the Last Year: Patient declined  Transportation Needs: No Transportation Needs (10/24/2023)   PRAPARE - Administrator, Civil Service (Medical): No    Lack of Transportation (Non-Medical): No  Physical Activity: Sufficiently Active (08/19/2023)   Exercise Vital Sign    Days of Exercise per Week: 3 days    Minutes of Exercise per Session: 50 min  Recent Concern: Physical Activity - Insufficiently Active (06/27/2023)   Exercise Vital Sign    Days of  Exercise per Week: 1 day    Minutes of Exercise per Session: 10 min  Stress: Stress Concern Present (09/15/2023)   [employer] of Occupational Health - Occupational Stress Questionnaire    Feeling of Stress : To some extent  Social Connections: Moderately Integrated (08/19/2023)   Social Connection and Isolation Panel [NHANES]    Frequency of Communication with Friends and Family: Three times a week    Frequency of Social Gatherings with Friends and Family: More than three times a week    Attends Religious Services: More than 4 times per year    Active Member of [employer] or Organizations: Yes    Attends Banker Meetings: 1 to 4 times per year    Marital Status: Divorced     Hospital Course:  During the patient's hospitalization, patient had extensive initial psychiatric evaluation, and follow-up psychiatric evaluations  every day.  Psychiatric diagnoses provided upon initial assessment: Bipolar I disorder, current or most recent episode depressed, with psychotic features with peripartum onset (HCC) [F31.5]   Patient's psychiatric medications were adjusted on admission:   Prazosin 2 mg at bedtime, risperidone 2 mg at bedtime, Trazodone 100 mg at bedtime, Gabapentin 300 mg three times daily, pantoprazole EC 40 mg once daily, Vitamin D3 2,000 units once daily  During the hospitalization, other adjustments were made to the patient's psychiatric medication regimen: n/a, patient tolerating medication regiment well and is reportedly helping patient's symptoms improve.  Patient's care was discussed during the interdisciplinary team meeting every day during the hospitalization.  The patient denies having side effects to prescribed psychiatric medication.  Gradually, patient started adjusting to milieu. The patient was evaluated each day by a clinical provider to ascertain response to treatment. Improvement was noted by the patient's report of decreasing symptoms, improved sleep and  appetite, affect, medication tolerance, behavior, and participation in unit programming.  Patient was asked each day to complete a self inventory noting mood, mental status, pain, new symptoms, anxiety and concerns.    Symptoms were reported as significantly decreased or resolved completely by discharge.   On day of discharge, the patient reports that their mood is stable. The patient denied having suicidal thoughts for more than 48 hours prior to discharge.  Patient denies having homicidal thoughts.  Patient denies having auditory hallucinations.  Patient denies any visual hallucinations or other symptoms of psychosis. The patient was motivated to continue taking medication with a goal of continued improvement in mental health.   The patient reports their target psychiatric symptoms of auditory and visual hallucinations responded well to the psychiatric medications and unit programming.  The patient reports overall benefit other psychiatric hospitalization. Supportive psychotherapy was provided to the patient. The patient also participated in regular group therapy while hospitalized. Coping skills, problem solving as well as relaxation therapies were also part of the unit programming.  Labs were reviewed with the patient, and abnormal results were discussed with the patient.  The patient is able to verbalize their individual safety plan to this provider.   Physical Findings:  AIMS:  Facial and Oral Movements: None Muscles of Facial Expression: None Lips and Perioral Area: None Jaw: None Tongue: None, Extremity Movements Upper (arms, wrists, hands, fingers): None Lower (legs, knees, ankles, toes): None, Trunk Movements Neck, shoulders, hips: None, Global Judgements Severity of abnormal movements overall: None Incapacitation due to abnormal movements: None Patient's awareness of abnormal movements: No Awareness, Dental Status Current problems with teeth and/or dentures: No Does patient  usually wear dentures: No Edentia: No   CIWA:   NA  COWS:  NA  Musculoskeletal: Strength & Muscle Tone: within normal limits Gait & Station: normal Patient leans: N/A    Psychiatric Specialty Exam:  Presentation  General Appearance: casual, normal for environment Eye Contact: normal, good Speech: normal, not pressured Speech Volume: normal Handedness: right  Mood and Affect  Mood: "great" Affect: depressed   Thought Process  Thought Processes: Linear Descriptions of Associations: denies/n.a. Orientation: Full orientation (person, place, time, situation)  Thought Content: delusions  History of Schizophrenia/Schizoaffective disorder: Yes  Duration of Psychotic Symptoms: Sunday (10/24/2023) - Tuesday (10/25/2023) Hallucinations: denies Ideas of Reference: denies/n.a. Suicidal Thoughts: denies Homicidal Thoughts: denies  Sensorium  Memory: Immediate Good; Recent Good; Remote poor  Judgment: Poor Insight: Adult nurse: Fair Attention Span: Fair Recall: [employer] of Knowledge: Fair Language: Fair  Psychomotor Activity  Psychomotor Activity: normal  Assets  Assets: Communication Skills; Desire for Improvement; Financial Resources/Insurance; Housing; Leisure Time; Physical Health; Social Support; Advertising copywriter; Transportation   Sleep  Sleep: good sleep      Physical Exam: General: Sitting comfortably. NAD. HEENT: Normocephalic, atraumatic, MMM, EMOI Lungs: no increased work of breathing noted Heart: no cyanosis Abdomen: Non distended Musculoskeletal: FROM. No obvious deformities Skin: Warm, dry, intact. No rashes noted Neuro: No obvious focal deficits.  Gait and station are normal  Review of Systems:  Constitutional: Negative.   HENT: Negative.    Eyes: Negative.   Respiratory: Negative.    Cardiovascular: Negative.   Gastrointestinal: Negative.   Genitourinary: Negative.   Skin: Negative.   Neurological:  Negative.   Psychiatric/Behavioral:  Positive for depression, hallucinations and substance abuse. Negative for memory loss and suicidal ideas. The patient is nervous/anxious.  Blood pressure 113/76, pulse 87, temperature 97.6 F (36.4 C), temperature source Oral, resp. rate 18, height 6\' 3"  (1.905 m), weight 99.3 kg, SpO2 97%. Body mass index is 27.37 kg/m.    Social History   Tobacco Use  Smoking Status Former   Current packs/day: 0.00   Types: Cigarettes   Quit date: 2021   Years since quitting: 4.2  Smokeless Tobacco Current   Types: Chew     Tobacco Cessation:  A prescription for an FDA approved medication for tobacco cessation was not prescribed because: Patient Refused   Blood Alcohol level:  Lab Results  Component Value Date   ETH <10 10/24/2023   ETH <10 02/22/2018    Metabolic Disorder Labs:  Lab Results  Component Value Date   HGBA1C 6.1 (H) 10/14/2023   No results found for: "PROLACTIN"  Lab Results  Component Value Date   CHOL 167 10/14/2023   TRIG 115 10/14/2023   HDL 40 10/14/2023   LDLCALC 106 (H) 10/14/2023   LDLCALC 102 (H) 10/28/2022      See Psychiatric Specialty Exam and Suicide Risk Assessment completed by Attending Physician prior to discharge.  Discharge destination:  Mother's home.  Is patient on multiple antipsychotic therapies at discharge:  No  Has Patient had three or more failed trials of antipsychotic monotherapy by history: NA Recommended Plan for Multiple Antipsychotic Therapies: NA       Allergies:  - Reports allergy to Sulf Drugs but cannot describe reaction.     Follow-up Information     [facility] Follow up on 11/14/2023.   Specialty: Behavioral Health Why: You have an appointment scheduled for 11/14/23 at 1:00 pm for medication management services, Virtual.  You also have an appointment with your provider for therapy services on 11/24/23 at 10:00 am, Virtual.  * Please  submit the new patient paperwork that was sent to you, prior to your first appointment. Contact information: [address] [place] [address] [phone]        Vesta Mixer. Call on 10/27/2023.   Why: Please call this provider if you wish to schedule a hospital follow up appointment for therapy and/or medication management services.  If you do so, please call and cancel the other appointments. Contact information: [address]  [address] [place] [address] [phone]         [facility] Follow up.   Why: Please follow up with your primary care provider at discharge to continue with routine appointments. Contact information: [address] [place] [address] [phone]  Follow-up recommendations:  - It is recommended to the patient to continue psychiatric medications as prescribed, after discharge from the hospital.   - It is recommended to the patient to follow up with your outpatient psychiatric provider and PCP. - It was discussed with the patient, the impact of alcohol, drugs, tobacco have been there overall psychiatric and medical wellbeing, and total abstinence from substance use was recommended the patient. - Prescriptions provided or sent directly to preferred pharmacy at discharge. Patient agreeable to plan. Given opportunity to ask questions. Appears to feel comfortable with discharge.   - In the event of worsening symptoms, the patient is instructed to call the crisis hotline, 911 and or go to the nearest ED for appropriate evaluation and treatment of symptoms. To follow-up with primary care provider for other medical issues, concerns and or health care needs - Patient was discharged 10/26/2023 with a plan to follow up as noted above.   Comments:  NA  SignedValetta Mole, MS3   10/26/23 9:41 AM

## 2023-10-26 NOTE — Group Note (Signed)
 Date:  10/26/2023 Time:  10:29 AM  Group Topic/Focus:  Goals Group:   The focus of this group is to help patients establish daily goals to achieve during treatment and discuss how the patient can incorporate goal setting into their daily lives to aide in recovery. Orientation:   The focus of this group is to educate the patient on the purpose and policies of crisis stabilization and provide a format to answer questions about their admission.  The group details unit policies and expectations of patients while admitted.    Participation Level:  Active  Participation Quality:  Appropriate  Affect:  Appropriate  Cognitive:  Appropriate  Insight: Appropriate  Engagement in Group:  Engaged  Modes of Intervention:  Discussion and Orientation  Additional Comments:   Pt attended and participated in the Orientation and Goals group.  [name] [name] Kyoko Elsea 10/26/2023, 10:29 AM
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
## 2023-10-26 NOTE — Plan of Care (Signed)
   Problem: Education: Goal: Emotional status will improve Outcome: Progressing Goal: Mental status will improve Outcome: Progressing   Problem: Activity: Goal: Interest or engagement in activities will improve Outcome: Progressing

## 2023-10-26 NOTE — Telephone Encounter (Signed)
 Copied from CRM [id number]. Topic: Clinical - Medication Refill >> Oct 26, 2023  4:03 PM Priscille Loveless wrote: Most Recent Primary Care Visit:  Provider: [name]  Department: [facility]  Visit Type: OFFICE VISIT  Date: 10/14/2023  Pt states that he has been in the hospital for 4 days.    Medication: HYDROcodone-acetaminophen (NORCO/VICODIN) 5-325 MG per tablet 1 tablet  Has the patient contacted their pharmacy? No   Is this the correct pharmacy for this prescription? Yes  This is the patient's preferred pharmacy:  [facility] [address], [place] - [address] [place] [address] Phone: [phone] Fax: [phone]   Has the prescription been filled recently? Yes  Is the patient out of the medication? Yes  Has the patient been seen for an appointment in the last year OR does the patient have an upcoming appointment? Yes  Can we respond through MyChart? Yes  Agent: Please be advised that Rx refills may take up to 3 business days. We ask that you follow-up with your pharmacy.

## 2023-10-26 NOTE — Transportation (Signed)
 10/26/2023  Ella Jubilee DOB: 1981-05-27 MRN: [id number]   RIDER WAIVER AND RELEASE OF LIABILITY  For the purposes of helping with transportation needs, [facility] partners with outside transportation providers (taxi companies, [place], Catering manager.) to give [employer] patients or other approved people the choice of on-demand rides [employer]") to our buildings for non-emergency visits.  By using [employer], I, the person signing this document, on behalf of myself and/or any legal minors (in my care using the [employer]), agree:  Science writer given to me are supplied by independent, outside transportation providers who do not work for, or have any affiliation with, [employer]. [place] is not a transportation company. [facility] has no control over the quality or safety of the rides I get using [employer]. [facility] has no control over whether any outside ride will happen on time or not. [facility] gives no guarantee on the reliability, quality, safety, or availability on any rides, or that no mistakes will happen. I know and accept that traveling by vehicle (car, truck, SVU, Zenaida Niece, bus, taxi, etc.) has risks of serious injuries such as disability, being paralyzed, and death. I know and agree the risk of using [employer] is mine alone, and not [employer]. Transport Services are provided "as is" and as are available. The transportation providers are in charge for all inspections and care of the vehicles used to provide these rides. I agree not to take legal action against [facility], its agents, employees, officers, directors, representatives, insurers, attorneys, assigns, successors, subsidiaries, and affiliates at any time for any reasons related directly or indirectly to using [employer]. I also agree not to take legal action against [place] or its affiliates for any injury, death, or damage to property caused by or related to using  [employer]. I have read this Waiver and Release of Liability, and I understand the terms used in it and their legal meaning. This Waiver is freely and voluntarily given with the understanding that my right (or any legal minors) to legal action against [facility] [place] relating to [employer] is knowingly given up to use these services.   I attest that I read the Ride Waiver and Release of Liability to Ella Jubilee, gave Mr. Glasscock the opportunity to ask questions and answered the questions asked (if any). I affirm that Jerami Tammen then provided consent for assistance with transportation.

## 2023-10-26 NOTE — Telephone Encounter (Signed)
 Urine drug screen was positive for cocaine; unable to refill prescription at this time.

## 2023-10-27 ENCOUNTER — Encounter: Payer: Self-pay | Admitting: Physical Medicine & Rehabilitation

## 2023-10-28 NOTE — Telephone Encounter (Signed)
Pt. Has been informed.

## 2023-10-31 ENCOUNTER — Ambulatory Visit (HOSPITAL_COMMUNITY): Payer: Self-pay | Admitting: Mental Health

## 2023-10-31 DIAGNOSIS — M5416 Radiculopathy, lumbar region: Secondary | ICD-10-CM | POA: Diagnosis not present

## 2023-11-04 DIAGNOSIS — F4310 Post-traumatic stress disorder, unspecified: Secondary | ICD-10-CM | POA: Diagnosis not present

## 2023-11-04 DIAGNOSIS — F315 Bipolar disorder, current episode depressed, severe, with psychotic features: Secondary | ICD-10-CM | POA: Diagnosis not present

## 2023-11-04 DIAGNOSIS — F1420 Cocaine dependence, uncomplicated: Secondary | ICD-10-CM | POA: Diagnosis not present

## 2023-11-04 DIAGNOSIS — F17290 Nicotine dependence, other tobacco product, uncomplicated: Secondary | ICD-10-CM | POA: Diagnosis not present

## 2023-11-09 DIAGNOSIS — F315 Bipolar disorder, current episode depressed, severe, with psychotic features: Secondary | ICD-10-CM | POA: Diagnosis not present

## 2023-11-14 ENCOUNTER — Ambulatory Visit (HOSPITAL_BASED_OUTPATIENT_CLINIC_OR_DEPARTMENT_OTHER): Admitting: Family

## 2023-11-14 DIAGNOSIS — F3181 Bipolar II disorder: Secondary | ICD-10-CM

## 2023-11-14 DIAGNOSIS — F4310 Post-traumatic stress disorder, unspecified: Secondary | ICD-10-CM

## 2023-11-14 DIAGNOSIS — F411 Generalized anxiety disorder: Secondary | ICD-10-CM

## 2023-11-14 MED ORDER — RISPERIDONE 3 MG PO TABS: 3.0000 mg | ORAL_TABLET | Freq: Every day | ORAL | 0 refills | Status: DC

## 2023-11-14 MED ORDER — PRAZOSIN HCL 2 MG PO CAPS: 2.0000 mg | ORAL_CAPSULE | Freq: Every day | ORAL | 0 refills | Status: DC

## 2023-11-14 MED ORDER — GABAPENTIN 400 MG PO CAPS: 400.0000 mg | ORAL_CAPSULE | Freq: Three times a day (TID) | ORAL | 2 refills | Status: DC

## 2023-11-14 MED ORDER — TRAZODONE HCL 100 MG PO TABS: 100.0000 mg | ORAL_TABLET | Freq: Every day | ORAL | 0 refills | Status: DC
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
## 2023-11-22 ENCOUNTER — Other Ambulatory Visit (HOSPITAL_COMMUNITY): Payer: Self-pay

## 2023-11-22 ENCOUNTER — Ambulatory Visit (HOSPITAL_BASED_OUTPATIENT_CLINIC_OR_DEPARTMENT_OTHER): Admitting: Family

## 2023-11-22 VITALS — BP 119/81 | HR 74

## 2023-11-22 DIAGNOSIS — F3181 Bipolar II disorder: Secondary | ICD-10-CM | POA: Diagnosis not present

## 2023-11-22 DIAGNOSIS — F22 Delusional disorders: Secondary | ICD-10-CM | POA: Diagnosis not present

## 2023-11-22 MED ORDER — QUETIAPINE FUMARATE 25 MG PO TABS: ORAL_TABLET | ORAL | 1 refills | Status: DC

## 2023-11-22 NOTE — Progress Notes (Signed)
 [facility] MD/PA/NP OP Progress Note  11/22/2023 2:43 PM Luckas Porro  MRN:  [id number]  Chief Complaint: " I had an episode yesterday and today at work."   HPI: Jami Kerrick 43 year old Caucasian male presents for follow-up visit.  He was seen and evaluated face-to-face by this provider. Jonothon reported he had experienced what sounds like panic/anxiety attacks.  He reports " my mood was off all day yesterday and today I left work."  Presented with mood irritability, sleep disturbance and auditory hallucinations.  States" I left early from work today because I felt like people were out to get me" states I do not think the medication is helping.    He is currently prescribed Risperdal  3 mg nightly.  Reports experiencing restless legs syndrome which she is attributing to this medication and possibly trazodone .  Reports she has been taking and tolerating medications well.  [place] carries a diagnosis related to bipolar disorder, generalized anxiety disorder , posttraumatic stress disorder and major depressive disorder.  Quesiton related to bipolar 1 vs schizoaffective bipolar type.  Currently he is prescribed gabapentin , risperidone  and trazodone .  States he has been taking and tolerating medications fairly.  Discussed discontinuing Risperdal  patient to start Seroquel 25 mg p.o. twice daily and Seroquel 50 mg nightly-will continue titration.  Patient has a virtual follow-up appointment 1 week.  -Reports he has tried and failed olanzapine  and Risperdal ,  Zyprexa , Lyrica , Depakote,. Stated " I think they have tried Seroquel only in the past and not really sure."   During evaluation Carvell Erisman is pleasant calm cooperative smiling throughout ;he is alert/oriented x 4; calm/cooperative; and mood congruent with affect.  Patient is speaking in a clear tone at moderate volume, and normal pace; with good eye contact.    His thought process is coherent and relevant; There is no indication that he is currently  responding to internal/external stimuli or experiencing delusional thought content.  Patient denies suicidal/self-harm/homicidal ideation, psychosis, and paranoia.  Patient has remained calm throughout assessment and has answered questions appropriately.    Visit Diagnosis:    ICD-10-CM   1. Paranoia (HCC)  F22     2. Bipolar 2 disorder, major depressive episode (HCC)  F31.81       Past Psychiatric History:   Past Medical History:  Past Medical History:  Diagnosis Date   Alcohol abuse    Anxiety reaction 12/18/2021   Bipolar 1 disorder (HCC)    Bipolar 1 disorder, depressed (HCC) 04/08/2020   Bipolar 2 disorder, major depressive episode (HCC) 04/08/2020   Bipolar disorder, current episode depressed, severe, with psychotic features (HCC) 10/24/2023   Chronic right shoulder pain 11/24/2021   Cocaine abuse (HCC)    Epigastric pain 01/27/2022   Gastroesophageal reflux disease 10/04/2022   GERD without esophagitis 12/01/2022   Hemorrhoids 12/01/2022   Hiatal hernia 12/01/2022   HNP (herniated nucleus pulposus), lumbar 10/04/2022   Hx of adenomatous colonic polyps 12/01/2022   Low back pain radiating down leg 02/29/2020   Lumbar stenosis with neurogenic claudication 12/08/2022   Other chronic pain 10/15/2023   Pre-operative clearance 12/03/2022   Restless leg 04/08/2020   Schizoaffective disorder, depressive type (HCC) 01/28/2023   Schizophrenia (HCC)    Schizophrenia, paranoid type (HCC) 06/06/2020   I was diagnosed just don't remember the exact date but it seems it gets worse lately     Sinusitis 10/28/2022   No past surgical history on file.  Family Psychiatric History:   Family History:  Family History  Family history  unknown: Yes    Social History:  Social History   Socioeconomic History   Marital status: Single    Spouse name: Not on file   Number of children: Not on file   Years of education: Not on file   Highest education level: GED or equivalent   Occupational History   Not on file  Tobacco Use   Smoking status: Former    Current packs/day: 0.00    Types: Cigarettes    Quit date: 2021    Years since quitting: 4.3   Smokeless tobacco: Current    Types: Chew  Vaping Use   Vaping status: Never Used  Substance and Sexual Activity   Alcohol use: Not Currently   Drug use: Not Currently    Types: Cocaine   Sexual activity: Not Currently  Other Topics Concern   Not on file  Social History Narrative   Not on file   Social Drivers of Health   Financial Resource Strain: High Risk (08/19/2023)   Overall Financial Resource Strain (CARDIA)    Difficulty of Paying Living Expenses: Very hard  Food Insecurity: No Food Insecurity (10/24/2023)   Hunger Vital Sign    Worried About Running Out of Food in the Last Year: Never true    Ran Out of Food in the Last Year: Never true  Recent Concern: Food Insecurity - Food Insecurity Present (08/19/2023)   Hunger Vital Sign    Worried About Running Out of Food in the Last Year: Sometimes true    Ran Out of Food in the Last Year: Patient declined  Transportation Needs: No Transportation Needs (10/24/2023)   PRAPARE - Administrator, Civil Service (Medical): No    Lack of Transportation (Non-Medical): No  Physical Activity: Sufficiently Active (08/19/2023)   Exercise Vital Sign    Days of Exercise per Week: 3 days    Minutes of Exercise per Session: 50 min  Recent Concern: Physical Activity - Insufficiently Active (06/27/2023)   Exercise Vital Sign    Days of Exercise per Week: 1 day    Minutes of Exercise per Session: 10 min  Stress: Stress Concern Present (09/15/2023)   [employer] of Occupational Health - Occupational Stress Questionnaire    Feeling of Stress : To some extent  Social Connections: Moderately Integrated (08/19/2023)   Social Connection and Isolation Panel [NHANES]    Frequency of Communication with Friends and Family: Three times a week    Frequency of  Social Gatherings with Friends and Family: More than three times a week    Attends Religious Services: More than 4 times per year    Active Member of [employer] or Organizations: Yes    Attends Banker Meetings: 1 to 4 times per year    Marital Status: Divorced    Allergies:  Allergies  Allergen Reactions   Sulfamethoxazole    Trimethoprim Nausea And Vomiting   Sulfamethoxazole-Trimethoprim Rash and Other (See Comments)    Childhood allergy. Does not know reaction    Metabolic Disorder Labs: Lab Results  Component Value Date   HGBA1C 6.1 (H) 10/14/2023   No results found for: "PROLACTIN" Lab Results  Component Value Date   CHOL 167 10/14/2023   TRIG 115 10/14/2023   HDL 40 10/14/2023   CHOLHDL 4.2 10/14/2023   LDLCALC 106 (H) 10/14/2023   LDLCALC 102 (H) 10/28/2022   Lab Results  Component Value Date   TSH 0.522 10/14/2023   TSH 0.551 10/28/2022    Therapeutic  Level Labs: No results found for: "LITHIUM" No results found for: "VALPROATE" No results found for: "CBMZ"  Current Medications: Current Outpatient Medications  Medication Sig Dispense Refill   QUEtiapine (SEROQUEL) 25 MG tablet Take 1 tablet (25 mg total) by mouth 2 (two) times daily AND 2 tablets (50 mg total) at bedtime. 90 tablet 1   Cholecalciferol  125 MCG (5000 UT) TABS Take 1 tablet (5,000 Units total) by mouth daily. 90 tablet 0   gabapentin  (NEURONTIN ) 400 MG capsule Take 1 capsule (400 mg total) by mouth 3 (three) times daily. 90 capsule 2   pantoprazole  (PROTONIX ) 40 MG tablet Take 1 tablet (40 mg total) by mouth daily. 60 tablet 2   prazosin  (MINIPRESS ) 2 MG capsule Take 1 capsule (2 mg total) by mouth at bedtime. 60 capsule 0   traZODone  (DESYREL ) 100 MG tablet Take 1 tablet (100 mg total) by mouth at bedtime. 30 tablet 0   Vitamin D , Ergocalciferol , (DRISDOL ) 1.25 MG (50000 UNIT) CAPS capsule Take 1 capsule (50,000 Units total) by mouth every 7 (seven) days. 5 capsule 1   No current  facility-administered medications for this visit.     Musculoskeletal: Strength & Muscle Tone: within normal limits Gait & Station: normal Patient leans: N/A  Psychiatric Specialty Exam: Review of Systems  Blood pressure 119/81, pulse 74.There is no height or weight on file to calculate BMI.  General Appearance: Casual work uniform noted  Eye Contact:  Good  Speech:  Clear and Coherent  Volume:  Normal  Mood:  Anxious and Depressed  Affect:  Congruent  Thought Process:  Coherent  Orientation:  Full (Time, Place, and Person)  Thought Content: Logical   Suicidal Thoughts:  No  Homicidal Thoughts:  No  Memory:  Immediate;   Good Recent;   Good  Judgement:  Good  Insight:  Fair  Psychomotor Activity:  Normal  Concentration:  Concentration: Good  Recall:  Good  Fund of Knowledge: Good  Language: Good  Akathisia:  No  Handed:  Right  AIMS (if indicated): not done  Assets:  Communication Skills Desire for Improvement Resilience Social Support  ADL's:  Intact  Cognition: WNL  Sleep:  Poor   Screenings: AUDIT    Flowsheet Row [facility]  Alcohol Use Disorder Identification Test Final Score (AUDIT) 0      GAD-7    Flowsheet Row Office Visit from 10/14/2023 in [facility] Office Visit from 11/09/2022 in [facility] Office Visit from 10/28/2022 in [facility] Office Visit from 10/15/2022 in [facility] Office Visit from 10/04/2022 in [facility]  Total GAD-7 Score 16 10 9 8 15       PHQ2-9    Flowsheet Row Office Visit from 10/14/2023 in [facility] Office Visit from 11/09/2022 in [facility] Office Visit from 10/28/2022 in [facility] Office Visit from 10/15/2022 in [facility] Office Visit  from 10/04/2022 in [facility]  PHQ-2 Total Score 5 5 1 4 5   PHQ-9 Total Score 15 18 11 13 16       Flowsheet Row Ad[facility] Most recent reading at 10/24/2023  6:00 PM ED from 10/24/2023 in [facility] Emergency Department at [facility] Most recent reading at 10/24/2023 12:33 AM  ED from 09/05/2023 in [facility] [place] Most recent reading at 09/05/2023  7:26 PM  C-SSRS RISK CATEGORY No Risk No Risk No Risk        Assessment and Plan: Marques Castagna 43 year old male presents for medication management follow-up appointment.  States he is been experiencing recent panic/anxiety attack coupled with increased paranoia.  States he left work early due to multiple episodes happening consecutive days.  Risperdal  3 mg night which she does not feel is helping with symptoms.  Reports restless legs.  He reports he has been compliant with medication.  Discussed discontinuing medication and initiate Seroquel 25 mg p.o. twice daily and start Seroquel 50 mg nightly. Continue gabapentin  and trazodone  as directed  Collaboration of Care: Collaboration of Care: Medication Management AEB start Seroquel 25 mg BID  to 50 mg nightly, follow-up 1 week for medication adherence/tolerability.  Patient/Guardian was advised Release of Information must be obtained prior to any record release in order to collaborate their care with an outside provider. Patient/Guardian was advised if they have not already done so to contact the registration department to sign all necessary forms in order for us  to release information regarding their care.   Consent: Patient/Guardian gives verbal consent for treatment and assignment of benefits for services provided during this visit. Patient/Guardian expressed understanding and agreed to proceed.    Levester Reagin, NP 11/22/2023, 2:43 PM

## 2023-11-24 ENCOUNTER — Encounter (HOSPITAL_COMMUNITY): Payer: Self-pay

## 2023-11-24 ENCOUNTER — Encounter (HOSPITAL_COMMUNITY): Payer: Self-pay | Admitting: Licensed Clinical Social Worker

## 2023-11-24 ENCOUNTER — Ambulatory Visit (HOSPITAL_COMMUNITY): Admitting: Licensed Clinical Social Worker

## 2023-11-24 DIAGNOSIS — F22 Delusional disorders: Secondary | ICD-10-CM

## 2023-11-24 DIAGNOSIS — F3181 Bipolar II disorder: Secondary | ICD-10-CM

## 2023-11-24 NOTE — Progress Notes (Signed)
 Virtual Visit via Video Note  I connected with Tamim Puma on 11/24/23 at 10:00 AM EDT by a video enabled telemedicine application and verified that I am speaking with the correct person using two identifiers.  Location: Patient: parked car  Provider: home   I discussed the limitations of evaluation and management by telemedicine and the availability of in person appointments. The patient expressed understanding and agreed to proceed.   I discussed the assessment and treatment plan with the patient. The patient was provided an opportunity to ask questions and all were answered. The patient agreed with the plan and demonstrated an understanding of the instructions.   The patient was advised to call back or seek an in-person evaluation if the symptoms worsen or if the condition fails to improve as anticipated.  I provided 45 minutes of non-face-to-face time during this encounter.   Seldon Dago, LCSW   THERAPIST PROGRESS NOTE  Session Time: 10:00am-10:45am  Participation Level: Active  Behavioral Response: NeatAlertDepressed  Type of Therapy: Individual Therapy  Treatment Goals addressed: Goal: LTG: Rafid will increase coping skills to promote long-term recovery and improve ability to perform daily activities  Dates: Start:  11/24/23   Expected End:  05/26/24    Disciplines: Interdisciplinary, PROVIDER  Intervention: Alyssa Backbone to participate in recovery peer support activities  Dates: Start:  11/24/23    Intervention: Educate [name] on relaxation techniques and the rationale for learning these techniques  Dates: Start:  11/24/23    Intervention: Educate [name] on cognitive restructuring techniques to address hallucinations and their rationale  Dates: Start:  11/24/23      ProgressTowards Goals: Initial  Interventions: DBT  Summary: Commie Fendley is a 43 y.o. male who presents with Bipolar II depressive type and paranoia.   Suicidal/Homicidal: Nowithout  intent/plan  Therapist Response: Kyandre engaged well in individual virtual session with Facilities manager. Clinician utilized DBT to process thoughts, feelings, interactions. Clinician built rapport to identify current sxs of paranoia, anxiety, and relapse into substance abuse. Clinician provided feedback about the impact of isolation on these sxs. Clinician identified natural supports, as well as a possible return to [employer], which have been helpful in the past. Clinician provided psychoeducation about inverse proportionality of emotions and logic. Clinician identified the importance of using relaxation techniques such as deep breathing to slow down the emotional reasoning and to increase logical reasoning when paranoia is present.   Plan: Return again in 4 weeks. Juquan has upcoming appts with [name] to address medication management. Aryian will begin biweekly appts in June.   Diagnosis: Bipolar 2 disorder, major depressive episode (HCC)  Paranoia (HCC)  Collaboration of Care: Medication Management AEB reviewed Tanika Lewis's notes and communicated about plan.   Patient/Guardian was advised Release of Information must be obtained prior to any record release in order to collaborate their care with an outside provider. Patient/Guardian was advised if they have not already done so to contact the registration department to sign all necessary forms in order for us  to release information regarding their care.   Consent: Patient/Guardian gives verbal consent for treatment and assignment of benefits for services provided during this visit. Patient/Guardian expressed understanding and agreed to proceed.   Merleen Stare [place], LCSW 11/24/2023

## 2023-11-26 DIAGNOSIS — M48061 Spinal stenosis, lumbar region without neurogenic claudication: Secondary | ICD-10-CM | POA: Diagnosis not present

## 2023-11-26 DIAGNOSIS — M4726 Other spondylosis with radiculopathy, lumbar region: Secondary | ICD-10-CM | POA: Diagnosis not present

## 2023-11-26 DIAGNOSIS — M5116 Intervertebral disc disorders with radiculopathy, lumbar region: Secondary | ICD-10-CM | POA: Diagnosis not present

## 2023-11-28 ENCOUNTER — Telehealth (HOSPITAL_BASED_OUTPATIENT_CLINIC_OR_DEPARTMENT_OTHER): Admitting: Family

## 2023-11-28 DIAGNOSIS — F3181 Bipolar II disorder: Secondary | ICD-10-CM | POA: Diagnosis not present

## 2023-11-28 DIAGNOSIS — F22 Delusional disorders: Secondary | ICD-10-CM | POA: Diagnosis not present

## 2023-11-28 NOTE — Progress Notes (Signed)
 Virtual Visit via Video Note  I connected with Jose Mitchell on 11/28/23 at  7:00 AM EDT by a video enabled telemedicine application and verified that I am speaking with the correct person using two identifiers.  Location: Patient: Car - stated "Jose Mitchell was working" Provider: Office   I discussed the limitations of evaluation and management by telemedicine and the availability of in person appointments. The patient expressed understanding and agreed to proceed.  I discussed the assessment and treatment plan with the patient. The patient was provided an opportunity to ask questions and all were answered. The patient agreed with the plan and demonstrated an understanding of the instructions.   The patient was advised to call back or seek an in-person evaluation if the symptoms worsen or if the condition fails to improve as anticipated.  I provided 15 minutes of non-face-to-face time during this encounter.   Levester Reagin, NP   [facility] MD/PA/NP OP Progress Note  11/28/2023 8:51 AM Jose Mitchell  MRN:  [id number]  Chief Complaint: Jose Mitchell stated "Still feeling depressed, groggy and paranoid."  HPI: Jose Mitchell 43 year old Caucasian Mitchell presents for medication management follow-up appointment.  Jose Mitchell carries a diagnosis related to bipolar 1 disorder, posttraumatic stress disorder, substance-induced mood disorder and schizophrenia.  patient was recently hospitalized due to increased paranoia and auditory hallucination.  Jose Mitchell was discharged with gabapentin  3 times daily, prazosin , Risperdal  2 mg and trazodone  50 mg nightly.  Subsequent visit patient's Risperdal  was increased to 3 mg nightly.  States Jose Mitchell was unable to tolerate medication.  This provider discontinued medication patient was initiated on Seroquel  25 mg twice daily 50 mg nightly.   Past medication trials with Abilify, Zyprexa , Rexulti, Depakote and  Lyrica .  Discussed which medication has been beneficial in the past.  States " Klonopin is the  only thing that has helped my anxiety."   Discussed continuing Seroquel  25 mg p.o. twice daily Will increase Seroquel  50 to 100 mg nightly. Continue gabapentin  300 mg 3 times daily  Jose Mitchell thought process appears to be clear, coherent and relevant.  Jose Mitchell reports paranoia but denied any auditory or visual hallucinations.  Denied suicidal or homicidal ideations.  Jose Mitchell is alert oriented x 3.  Jose Mitchell's mood is congruent with affect.  Patient observed resting in his vehicle states Jose Mitchell is currently working.  Patient encouraged to continue medication as directed.  Support, encouragement and  reassurance was provided.   Visit Diagnosis:    ICD-10-CM   1. Paranoia (HCC)  F22     2. Bipolar 2 disorder, major depressive episode (HCC)  F31.81       Past Psychiatric History:   Past Medical History:  Past Medical History:  Diagnosis Date   Alcohol abuse    Anxiety reaction 12/18/2021   Bipolar 1 disorder (HCC)    Bipolar 1 disorder, depressed (HCC) 04/08/2020   Bipolar 2 disorder, major depressive episode (HCC) 04/08/2020   Bipolar disorder, current episode depressed, severe, with psychotic features (HCC) 10/24/2023   Chronic right shoulder pain 11/24/2021   Cocaine abuse (HCC)    Epigastric pain 01/27/2022   Gastroesophageal reflux disease 10/04/2022   GERD without esophagitis 12/01/2022   Hemorrhoids 12/01/2022   Hiatal hernia 12/01/2022   HNP (herniated nucleus pulposus), lumbar 10/04/2022   Hx of adenomatous colonic polyps 12/01/2022   Low back pain radiating down leg 02/29/2020   Lumbar stenosis with neurogenic claudication 12/08/2022   Other chronic pain 10/15/2023   Pre-operative clearance 12/03/2022   Restless leg 04/08/2020  Schizoaffective disorder, depressive type (HCC) 01/28/2023   Schizophrenia (HCC)    Schizophrenia, paranoid type (HCC) 06/06/2020   I was diagnosed just don't remember the exact date but it seems it gets worse lately     Sinusitis 10/28/2022   No past surgical  history on file.  Family Psychiatric History:   Family History:  Family History  Family history unknown: Yes    Social History:  Social History   Socioeconomic History   Marital status: Single    Spouse name: Not on file   Number of children: Not on file   Years of education: Not on file   Highest education level: GED or equivalent  Occupational History   Not on file  Tobacco Use   Smoking status: Former    Current packs/day: 0.00    Types: Cigarettes    Quit date: 2021    Years since quitting: 4.3   Smokeless tobacco: Current    Types: Chew  Vaping Use   Vaping status: Never Used  Substance and Sexual Activity   Alcohol use: Not Currently   Drug use: Not Currently    Types: Cocaine   Sexual activity: Not Currently  Other Topics Concern   Not on file  Social History Narrative   Not on file   Social Drivers of Health   Financial Resource Strain: High Risk (08/19/2023)   Overall Financial Resource Strain (CARDIA)    Difficulty of Paying Living Expenses: Very hard  Food Insecurity: No Food Insecurity (10/24/2023)   Hunger Vital Sign    Worried About Running Out of Food in the Last Year: Never true    Ran Out of Food in the Last Year: Never true  Recent Concern: Food Insecurity - Food Insecurity Present (08/19/2023)   Hunger Vital Sign    Worried About Running Out of Food in the Last Year: Sometimes true    Ran Out of Food in the Last Year: Patient declined  Transportation Needs: No Transportation Needs (10/24/2023)   PRAPARE - Administrator, Civil Service (Medical): No    Lack of Transportation (Non-Medical): No  Physical Activity: Sufficiently Active (08/19/2023)   Exercise Vital Sign    Days of Exercise per Week: 3 days    Minutes of Exercise per Session: 50 min  Recent Concern: Physical Activity - Insufficiently Active (06/27/2023)   Exercise Vital Sign    Days of Exercise per Week: 1 day    Minutes of Exercise per Session: 10 min  Stress: Stress  Concern Present (09/15/2023)   Jose Mitchell    Feeling of Stress : To some extent  Social Connections: Moderately Integrated (08/19/2023)   Social Connection and Isolation Panel [NHANES]    Frequency of Communication with Friends and Family: Three times a week    Frequency of Social Gatherings with Friends and Family: More than three times a week    Attends Religious Services: More than 4 times per year    Active Member of [employer] or Organizations: Yes    Attends Banker Meetings: 1 to 4 times per year    Marital Status: Divorced    Allergies:  Allergies  Allergen Reactions   Sulfamethoxazole    Trimethoprim Nausea And Vomiting   Sulfamethoxazole-Trimethoprim Rash and Other (See Comments)    Childhood allergy. Does not know reaction    Metabolic Disorder Labs: Lab Results  Component Value Date   HGBA1C 6.1 (H) 10/14/2023   No  results found for: "PROLACTIN" Lab Results  Component Value Date   CHOL 167 10/14/2023   TRIG 115 10/14/2023   HDL 40 10/14/2023   CHOLHDL 4.2 10/14/2023   LDLCALC 106 (H) 10/14/2023   LDLCALC 102 (H) 10/28/2022   Lab Results  Component Value Date   TSH 0.522 10/14/2023   TSH 0.551 10/28/2022    Therapeutic Level Labs: No results found for: "LITHIUM" No results found for: "VALPROATE" No results found for: "CBMZ"  Current Medications: Current Outpatient Medications  Medication Sig Dispense Refill   Cholecalciferol  125 MCG (5000 UT) TABS Take 1 tablet (5,000 Units total) by mouth daily. 90 tablet 0   gabapentin  (NEURONTIN ) 400 MG capsule Take 1 capsule (400 mg total) by mouth 3 (three) times daily. 90 capsule 2   pantoprazole  (PROTONIX ) 40 MG tablet Take 1 tablet (40 mg total) by mouth daily. 60 tablet 2   prazosin  (MINIPRESS ) 2 MG capsule Take 1 capsule (2 mg total) by mouth at bedtime. 60 capsule 0   QUEtiapine  (SEROQUEL ) 25 MG tablet Take 1 tablet (25 mg total) by  mouth 2 (two) times daily AND 2 tablets (50 mg total) at bedtime. 120 tablet 1   traZODone  (DESYREL ) 100 MG tablet Take 1 tablet (100 mg total) by mouth at bedtime. 30 tablet 0   Vitamin D , Ergocalciferol , (DRISDOL ) 1.25 MG (50000 UNIT) CAPS capsule Take 1 capsule (50,000 Units total) by mouth every 7 (seven) days. 5 capsule 1   No current facility-administered medications for this visit.     Musculoskeletal: Strength & Muscle Tone: within normal limits Gait & Station: normal Patient leans: N/A  Psychiatric Specialty Exam: Review of Systems  There were no vitals taken for this visit.There is no height or weight on file to calculate BMI.  General Appearance: Casual  Eye Contact:  Good  Speech:  Clear and Coherent  Volume:  Normal  Mood:  Anxious  Affect:  Congruent  Thought Process:  Coherent  Orientation:  Full (Time, Place, and Person)  Thought Content: Logical   Suicidal Thoughts:  No  Homicidal Thoughts:  No  Memory:  Recent;   Good  Judgement:  Fair  Insight:  Fair  Psychomotor Activity:  Normal  Concentration:  Concentration: Good  Recall:  Good  Fund of Knowledge: Good  Language: Fair  Akathisia:  No  Handed:  Right  AIMS (if indicated): not done  Assets:  Communication Skills Desire for Improvement Resilience Social Support  ADL's:  Intact  Cognition: WNL  Sleep:  Fair   Screenings: AUDIT    Flowsheet Row [facility]  Alcohol Use Disorder Identification Test Final Score (AUDIT) 0      GAD-7    Flowsheet Row Office Visit from 10/14/2023 in [facility] Office Visit from 11/09/2022 in [facility] Office Visit from 10/28/2022 in [facility] Office Visit from 10/15/2022 in [facility] Office Visit from 10/04/2022 in [facility]  Total GAD-7 Score 16 10 9 8 15        PHQ2-9    Flowsheet Row Office Visit from 10/14/2023 in [facility] Office Visit from 11/09/2022 in [facility] Office Visit from 10/28/2022 in [facility] Office Visit from 10/15/2022 in [facility] Office Visit from 10/04/2022 in [facility] Care  PHQ-2 Total Score 5  5 1 4 5   PHQ-9 Total Score 15 18 11 13 16       Flowsheet Row [facility] Most recent reading at 10/24/2023  6:00 PM ED from 10/24/2023 in [facility] at [facility] Most recent reading at 10/24/2023 12:33 AM ED from 09/05/2023 in [facility] [place] Most recent reading at 09/05/2023  7:26 PM  C-SSRS RISK CATEGORY No Risk No Risk No Risk        Assessment and Plan: Jose Mitchell presents for medication management follow-up appointment.  Recently discontinued from Risperdal   3 mg nightly which Jose Mitchell reports Jose Mitchell has not been able to tolerate causing him symptoms to intensify.  Reports increased anxiety and paranoia.  Patient was discontinued from Risperdal  and started on Seroquel  50 mg nightly and 25 mg twice daily.  Reports Jose Mitchell has been taking medication however continues to have symptoms with paranoia and increased anxiety.  Discussed taking medications at night.  Will note that patient is taking Seroquel  100 mg nightly.  Continue gabapentin  300 mg p.o. 3 times daily for increased anxiety.  Follow-up 2 months for medication management   Collaboration of Care: Collaboration of Care: Medication Management AEB continue Seroquel  100 mg nightly , may discontinue daytime dosing  Patient/Guardian was advised Release of Information must be obtained prior to any record release in order to collaborate their care with an outside provider. Patient/Guardian was advised if they have not already  done so to contact the registration department to sign all necessary forms in order for us  to release information regarding their care.   Consent: Patient/Guardian gives verbal consent for treatment and assignment of benefits for services provided during this visit. Patient/Guardian expressed understanding and agreed to proceed.    Levester Reagin, NP 11/28/2023, 8:51 AM

## 2023-11-29 ENCOUNTER — Ambulatory Visit
Admission: EM | Admit: 2023-11-29 | Discharge: 2023-11-29 | Disposition: A | Discharge status: Home / Self Care | Attending: Nurse Practitioner | Admitting: Nurse Practitioner

## 2023-11-29 ENCOUNTER — Encounter: Payer: Self-pay | Admitting: Emergency Medicine

## 2023-11-29 ENCOUNTER — Ambulatory Visit (HOSPITAL_COMMUNITY)
Admission: EM | Admit: 2023-11-29 | Discharge: 2023-11-29 | Discharge status: Against Medical Advice | Attending: Psychiatry | Admitting: Psychiatry

## 2023-11-29 ENCOUNTER — Telehealth (HOSPITAL_COMMUNITY): Payer: Self-pay | Admitting: Family

## 2023-11-29 DIAGNOSIS — R1084 Generalized abdominal pain: Secondary | ICD-10-CM

## 2023-11-29 DIAGNOSIS — M5416 Radiculopathy, lumbar region: Secondary | ICD-10-CM | POA: Diagnosis not present

## 2023-11-29 DIAGNOSIS — Z8659 Personal history of other mental and behavioral disorders: Secondary | ICD-10-CM

## 2023-11-29 DIAGNOSIS — R079 Chest pain, unspecified: Secondary | ICD-10-CM

## 2023-11-29 DIAGNOSIS — F410 Panic disorder [episodic paroxysmal anxiety] without agoraphobia: Secondary | ICD-10-CM | POA: Diagnosis not present

## 2023-11-29 DIAGNOSIS — M47816 Spondylosis without myelopathy or radiculopathy, lumbar region: Secondary | ICD-10-CM | POA: Diagnosis not present

## 2023-11-29 MED ORDER — HYDROXYZINE HCL 25 MG PO TABS: 25.0000 mg | ORAL_TABLET | Freq: Three times a day (TID) | ORAL | 0 refills | Status: DC | PRN

## 2023-11-29 MED ORDER — ALUM & MAG HYDROXIDE-SIMETH 200-200-20 MG/5ML PO SUSP
30.0000 mL | Freq: Once | ORAL | Status: AC
  Administered 2023-11-29: 30 mL via ORAL

## 2023-11-29 MED ORDER — LIDOCAINE VISCOUS HCL 2 % MT SOLN
15.0000 mL | Freq: Once | OROMUCOSAL | Status: AC
  Administered 2023-11-29: 15 mL via OROMUCOSAL

## 2023-11-29 NOTE — Discharge Instructions (Signed)
 Take medication as prescribed. Continue current medications. Increase fluids and allow for plenty of rest. Follow-up with behavioral health for worsening anxiety.  Go to the emergency department immediately if you experiencing a severe panic attack. Follow-up in the emergency department also if you are experiencing worsening chest pain, shortness of breath, difficulty breathing, or other concerns. Recommend follow-up with your primary care physician for reevaluation within the next 7 to 10 days.  Also recommend follow-up with behavioral health this week for worsening anxiety. Follow-up as needed.

## 2023-11-29 NOTE — ED Provider Notes (Signed)
 [facility]    CSN: [id number] Arrival date & time: 11/29/23  1535      History   Chief Complaint No chief complaint on file.   HPI Jose Mitchell is a 43 y.o. male.   The history is provided by the patient.   Patient presents for complaints of chest pain, generalized abdominal pain, and nausea.  Patient states symptoms started today.  Patient states his chest feels "heavy" and he also endorses a panic attack.  Patient states that he has underlying history of bipolar disorder, PTSD, schizoaffective disorder, schizophrenia, PTSD, and paranoid schizophrenia.  He is currently taking Seroquel , gabapentin , and trazodone  for symptoms.  Patient reports that his Seroquel  was recently increased over the past week.  Patient states that he also feels "bloated" states that his last bowel movement this morning.  He does have a history of reflux disease, states that he takes Protonix  daily.  Patient denies fever, chills, vomiting, diarrhea, or rash.  Patient was seen by behavioral health 1 day ago.  Past Medical History:  Diagnosis Date   Alcohol abuse    Anxiety reaction 12/18/2021   Bipolar 1 disorder (HCC)    Bipolar 1 disorder, depressed (HCC) 04/08/2020   Bipolar 2 disorder, major depressive episode (HCC) 04/08/2020   Bipolar disorder, current episode depressed, severe, with psychotic features (HCC) 10/24/2023   Chronic right shoulder pain 11/24/2021   Cocaine abuse (HCC)    Epigastric pain 01/27/2022   Gastroesophageal reflux disease 10/04/2022   GERD without esophagitis 12/01/2022   Hemorrhoids 12/01/2022   Hiatal hernia 12/01/2022   HNP (herniated nucleus pulposus), lumbar 10/04/2022   Hx of adenomatous colonic polyps 12/01/2022   Low back pain radiating down leg 02/29/2020   Lumbar stenosis with neurogenic claudication 12/08/2022   Other chronic pain 10/15/2023   Pre-operative clearance 12/03/2022   Restless leg 04/08/2020   Schizoaffective disorder, depressive type  (HCC) 01/28/2023   Schizophrenia (HCC)    Schizophrenia, paranoid type (HCC) 06/06/2020   I was diagnosed just don't remember the exact date but it seems it gets worse lately     Sinusitis 10/28/2022    Patient Active Problem List   Diagnosis Date Noted   Substance-induced psychotic disorder with hallucinations (HCC) 10/24/2023   Insomnia 08/19/2023   PTSD (post-traumatic stress disorder) 05/03/2019    History reviewed. No pertinent surgical history.     Home Medications    Prior to Admission medications   Medication Sig Start Date End Date Taking? Authorizing Provider  Cholecalciferol  125 MCG (5000 UT) TABS Take 1 tablet (5,000 Units total) by mouth daily. 10/26/23   Timmothy Foots, MD  gabapentin  (NEURONTIN ) 400 MG capsule Take 1 capsule (400 mg total) by mouth 3 (three) times daily. 11/14/23 11/13/24  Levester Reagin, NP  pantoprazole  (PROTONIX ) 40 MG tablet Take 1 tablet (40 mg total) by mouth daily. 07/01/23 12/28/23  Zarwolo, Gloria, FNP  prazosin  (MINIPRESS ) 2 MG capsule Take 1 capsule (2 mg total) by mouth at bedtime. 11/14/23   Levester Reagin, NP  QUEtiapine  (SEROQUEL ) 25 MG tablet Take 1 tablet (25 mg total) by mouth 2 (two) times daily AND 2 tablets (50 mg total) at bedtime. 11/22/23 11/21/24  Levester Reagin, NP  traZODone  (DESYREL ) 100 MG tablet Take 1 tablet (100 mg total) by mouth at bedtime. 11/14/23   Levester Reagin, NP  Vitamin D , Ergocalciferol , (DRISDOL ) 1.25 MG (50000 UNIT) CAPS capsule Take 1 capsule (50,000 Units total) by mouth every 7 (seven) days.  08/25/23   Zarwolo, Gloria, FNP    Family History Family History  Family history unknown: Yes    Social History Social History   Tobacco Use   Smoking status: Former    Current packs/day: 0.00    Types: Cigarettes    Quit date: 2021    Years since quitting: 4.3   Smokeless tobacco: Current    Types: Chew  Vaping Use   Vaping status: Never Used  Substance Use Topics   Alcohol use: Not Currently   Drug use:  Not Currently    Types: Cocaine     Allergies   Sulfamethoxazole, Trimethoprim, and Sulfamethoxazole-trimethoprim   Review of Systems Review of Systems Per HPI  Physical Exam Triage Vital Signs ED Triage Vitals  Encounter Vitals Group     BP 11/29/23 1541 123/82     Systolic BP Percentile --      Diastolic BP Percentile --      Pulse Rate 11/29/23 1541 63     Resp 11/29/23 1541 18     Temp 11/29/23 1541 97.8 F (36.6 C)     Temp Source 11/29/23 1541 Oral     SpO2 11/29/23 1541 96 %     Weight --      Height --      Head Circumference --      Peak Flow --      Pain Score 11/29/23 1542 6     Pain Loc --      Pain Education --      Exclude from Growth Chart --    No data found.  Updated Vital Signs BP 123/82 (BP Location: Right Arm)   Pulse 63   Temp 97.8 F (36.6 C) (Oral)   Resp 18   SpO2 96%   Visual Acuity Right Eye Distance:   Left Eye Distance:   Bilateral Distance:    Right Eye Near:   Left Eye Near:    Bilateral Near:     Physical Exam Vitals and nursing note reviewed.  Constitutional:      General: He is not in acute distress.    Appearance: Normal appearance.  HENT:     Head: Normocephalic.  Eyes:     Extraocular Movements: Extraocular movements intact.     Conjunctiva/sclera: Conjunctivae normal.     Pupils: Pupils are equal, round, and reactive to light.  Cardiovascular:     Rate and Rhythm: Normal rate and regular rhythm.     Pulses: Normal pulses.     Heart sounds: Normal heart sounds.  Pulmonary:     Effort: Pulmonary effort is normal. No respiratory distress.     Breath sounds: Normal breath sounds. No stridor. No wheezing, rhonchi or rales.  Abdominal:     General: Abdomen is protuberant. Bowel sounds are normal.     Palpations: Abdomen is soft.     Tenderness: There is generalized abdominal tenderness.     Comments: Patient reports improvement of symptoms after GI cocktail.  Musculoskeletal:     Cervical back: Normal range  of motion.  Skin:    General: Skin is warm and dry.  Neurological:     General: No focal deficit present.     Mental Status: He is alert and oriented to person, place, and time.  Psychiatric:        Attention and Perception: Attention normal.        Mood and Affect: Mood normal. Affect is flat.        Speech:  Speech normal.        Behavior: Behavior normal.        Thought Content: Thought content normal.        Cognition and Memory: Cognition and memory normal.        Judgment: Judgment normal.      UC Treatments / Results  Labs (all labs ordered are listed, but only abnormal results are displayed) Labs Reviewed - No data to display  EKG: Normal sinus rhythm, no STEMI.  Comparisons reviewed dated 10/24/2023, 12/03/2022, and 01/12/2022.   Radiology No results found.  Procedures Procedures (including critical care time)  Medications Ordered in UC Medications - No data to display  Initial Impression / Assessment and Plan / UC Course  I have reviewed the triage vital signs and the nursing notes.  Pertinent labs & imaging results that were available during my care of the patient were reviewed by me and considered in my medical decision making (see chart for details).  Patient presents with complaints of chest pain and abdominal pain with nausea that started earlier this morning.  EKG shows normal sinus rhythm, no STEMI.  He does report underlying history of anxiety, states he is having a panic attack.  Patient with underlying behavioral health history to include PTSD, schizophrenia, and bipolar disorder.  Patient's Seroquel  was recently increased over the past week.  GI cocktail was administered with some relief of patient's symptoms.  Will start patient on hydroxyzine  25 mg 3 times daily as needed for anxiety and have him follow-up with behavioral health for further evaluation.  With regard to his chest pain, EKG shows normal sinus rhythm, will have patient follow-up with his primary  care physician for reevaluation.  Patient's anxiety may be contributing to his current symptoms.  Patient was given strict ER follow-up precautions.  Patient was in agreement with this plan of care and verbalized understanding.  All questions were answered.  Patient stable for discharge.  Work note was provided.  Final diagnoses:  None   Discharge Instructions   None    ED Prescriptions   None    PDMP not reviewed this encounter.   Hardy Lia, NP 11/29/23 [phone]

## 2023-11-29 NOTE — Progress Notes (Signed)
   11/29/23 1439  [facility] Triage Screening (Walk-ins at [facility] only)  How Did You Hear About Us ? Self  What Is the Reason for Your Visit/Call Today? Jose Mitchell presents to [facility] voluntarily unaccompanied. Pt states that he has been having panic attacks. Pt states that he felt an emptiness in his stomach and chest pains. Pt shares that he went to a medical urgent care and they advised him to come here, without being evaluated. Pt shares that he spoke with NP Tanika before going to the urgent care which was her suggestion. Pt currently denies SI, HI, AVH and alcohol/drug use.  How Long Has This Been Causing You Problems? <Week  Have You Recently Had Any Thoughts About Hurting Yourself? No  Are You Planning to Commit Suicide/Harm Yourself At This time? No  Have you Recently Had Thoughts About Hurting Someone Marigene Shoulder? No  Are You Planning To Harm Someone At This Time? No  Physical Abuse Denies  Verbal Abuse Denies  Sexual Abuse Denies  Exploitation of patient/patient's resources Denies  Self-Neglect Denies  Are you currently experiencing any auditory, visual or other hallucinations? No  Have You Used Any Alcohol or Drugs in the Past 24 Hours? No  Do you have any current medical co-morbidities that require immediate attention? No  Clinician description of patient physical appearance/behavior: calm, cooperative  What Do You Feel Would Help You the Most Today? Treatment for Depression or other mood problem  If access to [facility] was not available, would you have sought care in the Emergency Department? No  Determination of Need Routine (7 days)  Options For Referral Medication Management;Outpatient Therapy

## 2023-11-29 NOTE — ED Triage Notes (Signed)
 States has been having stomach and chest pains today.  States he feels nauseated.

## 2023-12-05 ENCOUNTER — Telehealth (HOSPITAL_COMMUNITY): Payer: Self-pay

## 2023-12-05 MED ORDER — HYDROXYZINE HCL 25 MG PO TABS
25.0000 mg | ORAL_TABLET | Freq: Three times a day (TID) | ORAL | 2 refills | Status: DC | PRN
Start: 2023-12-05 — End: 2023-12-26

## 2023-12-05 NOTE — Telephone Encounter (Signed)
 Patient is calling because he went to the Urgent care and they gave him Hydroxyzine , he wants to know if you can continue it. Please review and advise, thank you

## 2023-12-05 NOTE — Telephone Encounter (Cosign Needed)
 Yes, Hydroxyzine  25mg s was sent to the pharmacy

## 2023-12-14 ENCOUNTER — Ambulatory Visit (HOSPITAL_COMMUNITY): Admitting: Family

## 2023-12-14 ENCOUNTER — Encounter (HOSPITAL_COMMUNITY): Payer: Self-pay

## 2023-12-14 ENCOUNTER — Encounter: Admitting: Physical Medicine & Rehabilitation

## 2023-12-26 ENCOUNTER — Telehealth (HOSPITAL_BASED_OUTPATIENT_CLINIC_OR_DEPARTMENT_OTHER): Admitting: Family

## 2023-12-26 DIAGNOSIS — F3181 Bipolar II disorder: Secondary | ICD-10-CM

## 2023-12-26 MED ORDER — HYDROXYZINE HCL 25 MG PO TABS: 25.0000 mg | ORAL_TABLET | Freq: Three times a day (TID) | ORAL | 2 refills | Status: AC | PRN

## 2023-12-26 MED ORDER — PRAZOSIN HCL 2 MG PO CAPS: 2.0000 mg | ORAL_CAPSULE | Freq: Every day | ORAL | 0 refills | Status: DC

## 2023-12-26 MED ORDER — GABAPENTIN 400 MG PO CAPS: 400.0000 mg | ORAL_CAPSULE | Freq: Three times a day (TID) | ORAL | 2 refills | Status: DC

## 2023-12-26 NOTE — Progress Notes (Signed)
 Virtual Visit via Video Note  I connected with Jose Mitchell on 12/26/23 at 11:00 AM EDT by a video enabled telemedicine application and verified that I am speaking with the correct person using two identifiers.  Location: Patient: work Provider: office    I discussed the limitations of evaluation and management by telemedicine and the availability of in person appointments. The patient expressed understanding and agreed to proceed.   I discussed the assessment and treatment plan with the patient. The patient was provided an opportunity to ask questions and all were answered. The patient agreed with the plan and demonstrated an understanding of the instructions.   The patient was advised to call back or seek an in-person evaluation if the symptoms worsen or if the condition fails to improve as anticipated.  I provided 15 minutes of non-face-to-face time during this encounter.   Levester Reagin, NP   [facility] MD/PA/NP OP Progress Note  12/26/2023 5:36 PM Jose Mitchell  MRN:  [id number]  Chief Complaint:  " I am doing better"   HPI: Jose Mitchell 43 year old Caucasian male seen and evaluated via caregility for medication management.  Currently carries a diagnosis related to major depressive disorder, schizoaffective disorder, schizophrenia, posttraumatic stress disorder and generalized anxiety disorder.    Jose Mitchell was initiated on Seroquel  25 mg p.o. twice daily and 50 mg p.o. nightly.  He reports his anxiety and auditory hallucinations was intensified at the time of initiating Seroquel  1 month prior.  States he has since discontinued Seroquel  and only takes gabapentin  and hydroxyzine  for mood stabilization.  States he has self titrated medication to see how you feel and feels like he is doing a lot better.  Denied that he is taking trazodone  as directed.  Reports he continues to have restless leg which she has been taking Flexeril /cyclobenzaprine  which seems to help his restless leg syndrome.     States he would like to continue to only take gabapentin  and hydroxyzine  for mood stabilization.  Does report vivid dreams states he will restart taking Minipress  for reported symptoms.  No concerns related to suicidal or homicidal ideations.  Denies auditory or visual hallucinations.    Jose Mitchell states he is feeling more alert and "energized" with current medication regimen while at work.  Reports a good appetite.  States he is resting well throughout the night.  Patient to follow-up 3 months for medication management.  Support encouragement reassurance was provided.  Consideration for revisiting ACT services however patient states that his mood has been under control since he has discontinued antipsychotics.     Visit Diagnosis:    ICD-10-CM   1. Bipolar 2 disorder, major depressive episode (HCC)  F31.81       Past Psychiatric History:   Past Medical History:  Past Medical History:  Diagnosis Date   Alcohol abuse    Anxiety reaction 12/18/2021   Bipolar 1 disorder (HCC)    Bipolar 1 disorder, depressed (HCC) 04/08/2020   Bipolar 2 disorder, major depressive episode (HCC) 04/08/2020   Bipolar disorder, current episode depressed, severe, with psychotic features (HCC) 10/24/2023   Chronic right shoulder pain 11/24/2021   Cocaine abuse (HCC)    Epigastric pain 01/27/2022   Gastroesophageal reflux disease 10/04/2022   GERD without esophagitis 12/01/2022   Hemorrhoids 12/01/2022   Hiatal hernia 12/01/2022   HNP (herniated nucleus pulposus), lumbar 10/04/2022   Hx of adenomatous colonic polyps 12/01/2022   Low back pain radiating down leg 02/29/2020   Lumbar stenosis with neurogenic claudication 12/08/2022  Other chronic pain 10/15/2023   Pre-operative clearance 12/03/2022   Restless leg 04/08/2020   Schizoaffective disorder, depressive type (HCC) 01/28/2023   Schizophrenia (HCC)    Schizophrenia, paranoid type (HCC) 06/06/2020   I was diagnosed just don't remember the exact date  but it seems it gets worse lately     Sinusitis 10/28/2022   No past surgical history on file.  Family Psychiatric History:   Family History:  Family History  Family history unknown: Yes    Social History:  Social History   Socioeconomic History   Marital status: Single    Spouse name: Not on file   Number of children: Not on file   Years of education: Not on file   Highest education level: GED or equivalent  Occupational History   Not on file  Tobacco Use   Smoking status: Former    Current packs/day: 0.00    Types: Cigarettes    Quit date: 2021    Years since quitting: 4.4   Smokeless tobacco: Current    Types: Chew  Vaping Use   Vaping status: Never Used  Substance and Sexual Activity   Alcohol use: Not Currently   Drug use: Not Currently    Types: Cocaine   Sexual activity: Not Currently  Other Topics Concern   Not on file  Social History Narrative   Not on file   Social Drivers of Health   Financial Resource Strain: Low Risk  (11/29/2023)   Received from [facility]   Overall Financial Resource Strain (CARDIA)    Difficulty of Paying Living Expenses: Not hard at all  Food Insecurity: No Food Insecurity (11/29/2023)   Received from [facility]   Hunger Vital Sign    Worried About Running Out of Food in the Last Year: Never true    Ran Out of Food in the Last Year: Never true  Transportation Needs: No Transportation Needs (11/29/2023)   [facility] - Transportation    Lack of Transportation (Medical): No    Lack of Transportation (Non-Medical): No  Physical Activity: Sufficiently Active (08/19/2023)   Exercise Vital Sign    Days of Exercise per Week: 3 days    Minutes of Exercise per Session: 50 min  Recent Concern: Physical Activity - Insufficiently Active (06/27/2023)   Exercise Vital Sign    Days of Exercise per Week: 1 day    Minutes of Exercise per Session: 10 min  Stress: Stress Concern Present (09/15/2023)   [employer] - Occupational Stress Questionnaire    Feeling of Stress : To some extent  Social Connections: Moderately Integrated (08/19/2023)   Social Connection and Isolation Panel [NHANES]    Frequency of Communication with Friends and Family: Three times a week    Frequency of Social Gatherings with Friends and Family: More than three times a week    Attends Religious Services: More than 4 times per year    Active Member of [employer] or Organizations: Yes    Attends Banker Meetings: 1 to 4 times per year    Marital Status: Divorced    Allergies:  Allergies  Allergen Reactions   Sulfamethoxazole    Trimethoprim Nausea And Vomiting   Sulfamethoxazole-Trimethoprim Rash and Other (See Comments)    Childhood allergy. Does not know reaction    Metabolic Disorder Labs: Lab Results  Component Value Date   HGBA1C 6.1 (H) 10/14/2023   No results found for: "PROLACTIN" Lab Results  Component Value Date   CHOL 167 10/14/2023   TRIG 115 10/14/2023   HDL 40 10/14/2023   CHOLHDL 4.2 10/14/2023   LDLCALC 106 (H) 10/14/2023   LDLCALC 102 (H) 10/28/2022   Lab Results  Component Value Date   TSH 0.522 10/14/2023   TSH 0.551 10/28/2022    Therapeutic Level Labs: No results found for: "LITHIUM" No results found for: "VALPROATE" No results found for: "CBMZ"  Current Medications: Current Outpatient Medications  Medication Sig Dispense Refill   Cholecalciferol  125 MCG (5000 UT) TABS Take 1 tablet (5,000 Units total) by mouth daily. 90 tablet 0   gabapentin  (NEURONTIN ) 400 MG capsule Take 1 capsule (400 mg total) by mouth 3 (three) times daily. 90 capsule 2   hydrOXYzine  (ATARAX ) 25 MG tablet Take 1 tablet (25 mg total) by mouth every 8 (eight) hours as needed. 30 tablet 2   pantoprazole  (PROTONIX ) 40 MG tablet Take 1 tablet (40 mg total) by mouth daily. 60 tablet 2   prazosin  (MINIPRESS ) 2 MG capsule Take 1 capsule (2 mg total) by mouth at bedtime. 60  capsule 0   Vitamin D , Ergocalciferol , (DRISDOL ) 1.25 MG (50000 UNIT) CAPS capsule Take 1 capsule (50,000 Units total) by mouth every 7 (seven) days. 5 capsule 1   No current facility-administered medications for this visit.     Musculoskeletal: Virtual assessment   Psychiatric Specialty Exam: Review of Systems  There were no vitals taken for this visit.There is no height or weight on file to calculate BMI.  General Appearance: Casual  Eye Contact:  Good  Speech:  Clear and Coherent  Volume:  Normal  Mood:  Anxious and Depressed  Affect:  Congruent  Thought Process:  Coherent  Orientation:  Full (Time, Place, and Person)  Thought Content: Logical   Suicidal Thoughts:  No  Homicidal Thoughts:  No  Memory:  Immediate;   Good Recent;   Good  Judgement:  Good  Insight:  Good  Psychomotor Activity:  Normal  Concentration:  Concentration: Good  Recall:  Good  Fund of Knowledge: Good  Language: Good  Akathisia:  No  Handed:  Right  AIMS (if indicated): not done  Assets:  Communication Skills Desire for Improvement Resilience Social Support  ADL's:  Intact  Cognition: WNL  Sleep:  Good   Screenings: AUDIT    Flowsheet Row [facility]  Alcohol Use Disorder Identification Test Final Score (AUDIT) 0      GAD-7    Flowsheet Row Office Visit from 10/14/2023 in [facility] Office Visit from 11/09/2022 in [facility] Office Visit from 10/28/2022 in [facility] Office Visit from 10/15/2022 in [facility] Office Visit from 10/04/2022 in [facility]  Total GAD-7 Score 16 10 9 8 15       PHQ2-9    Flowsheet Row Office Visit from 10/14/2023 in [facility] Office Visit from 11/09/2022 in [facility] Office Visit from 10/28/2022 in [facility] Office Visit from 10/15/2022 in [facility] Office Visit from 10/04/2022 in [facility] Primary Care  PHQ-2 Total Score 5 5 1 4 5   PHQ-9 Total Score 15 18 11 13 16       Flowsheet Row UC from 11/29/2023 in [facility] [place] Most recent reading at 11/29/2023  3:43 PM  ED from 11/29/2023 in [facility] Most recent reading at 11/29/2023  2:51 PM [facility] Most recent reading at 10/24/2023  6:00 PM  C-SSRS RISK CATEGORY No Risk No Risk No Risk        Assessment and Plan: Jose Mitchell 43 year old Caucasian male was seen and evaluated via care agility he reports overall his mood has stabilized since previous visit.  He reported that he self discontinued Seroquel .  patient was initiated on Seroquel  25 mg p.o. twice daily and 50 mg nightly states he has not taken medication as directed.  He reports he has tried multiple psychotropic medications in the past and states he feels a lot better only taking gabapentin  and hydroxyzine .  Reports he has been utilizing Flexeril  to help with his restless leg syndrome.  No concerns related to suicidal homicidal ideations.  He denied auditory or visual hallucinations.  Patient to continue with current medication dosage.  Follow-up 3 months for medication adherence/tolerability.  Support encouragement reassurance was provided.  Collaboration of Care: Collaboration of Care: Medication Management AEB will make gabapentin  and hydroxyzine  available  Patient/Guardian was advised Release of Information must be obtained prior to any record release in order to collaborate their care with an outside provider. Patient/Guardian was advised if they have not already done so to contact the registration department to sign all necessary forms in order for us  to release information regarding their care.    Consent: Patient/Guardian gives verbal consent for treatment and assignment of benefits for services provided during this visit. Patient/Guardian expressed understanding and agreed to proceed.    Levester Reagin, NP 12/26/2023, 5:36 PM

## 2023-12-28 ENCOUNTER — Ambulatory Visit (HOSPITAL_COMMUNITY): Admitting: Licensed Clinical Social Worker

## 2023-12-29 ENCOUNTER — Encounter (HOSPITAL_COMMUNITY): Payer: Self-pay

## 2023-12-29 ENCOUNTER — Ambulatory Visit (HOSPITAL_COMMUNITY): Admitting: Licensed Clinical Social Worker

## 2024-01-11 ENCOUNTER — Encounter (HOSPITAL_COMMUNITY): Payer: Self-pay | Admitting: Licensed Clinical Social Worker

## 2024-01-11 ENCOUNTER — Ambulatory Visit (INDEPENDENT_AMBULATORY_CARE_PROVIDER_SITE_OTHER): Admitting: Licensed Clinical Social Worker

## 2024-01-11 DIAGNOSIS — F3181 Bipolar II disorder: Secondary | ICD-10-CM | POA: Diagnosis not present

## 2024-01-11 NOTE — Progress Notes (Signed)
 Virtual Visit via Video Note  I connected with Jose Mitchell on 01/11/24 at 10:00 AM EDT by a video enabled telemedicine application and verified that I am speaking with the correct person using two identifiers.  Location: Patient: home Provider: home office   I discussed the limitations of evaluation and management by telemedicine and the availability of in person appointments. The patient expressed understanding and agreed to proceed.   I discussed the assessment and treatment plan with the patient. The patient was provided an opportunity to ask questions and all were answered. The patient agreed with the plan and demonstrated an understanding of the instructions.   The patient was advised to call back or seek an in-person evaluation if the symptoms worsen or if the condition fails to improve as anticipated.  I provided 25 minutes of non-face-to-face time during this encounter.   Jose Dago, LCSW   THERAPIST PROGRESS NOTE  Session Time: 10:00am-10:25am  Participation Level: Active  Behavioral Response: NeatAlertEuthymic  Type of Therapy: Individual Therapy  Treatment Goals addressed: Treatment Goals addressed: Goal: LTG: Jose Mitchell will increase coping skills to promote long-term recovery and improve ability to perform daily activities  Dates: Start:  11/24/23   Expected End:  05/26/24    Disciplines: Interdisciplinary, PROVIDER  Intervention: Alyssa Backbone to participate in recovery peer support activities  Dates: Start:  11/24/23    Intervention: Educate Jose Mitchell on relaxation techniques and the rationale for learning these techniques  Dates: Start:  11/24/23    Intervention: Educate Jose Mitchell on cognitive restructuring techniques to address hallucinations and their rationale  Dates: Start:  11/24/23       ProgressTowards Goals: Progressing  Interventions: CBT  Summary: Jose Mitchell is a 43 y.o. male who presents with Bipolar 2, depressed.    Suicidal/Homicidal: Nowithout intent/plan  Therapist Response: Jose Mitchell engaged well in individual virtual session with Facilities manager. Clinician utilized CBT to process thoughts, feelings, and interactions. Clinician processed improvement in mood overall and noted that sleep, mood, and socialization has greatly improved. Clinician processed changes in medications and noted that they seem to be reaching effectiveness level, which has really satisfied Jose Mitchell. Clinician explored socialization and noted that Jose Mitchell has been spending more time with his parents, not arguing or feeling frustrated with them, and his sleep has greatly improved. Clinician explored hallucinations and noted that if the thoughts pop up, he is able to tell himself it's not real and push the thoughts to the back of his mind. Jose Mitchell also shared that he has been going to church on a weekly basis and this has helped him spiritually and socially.    Plan: Return again in 4 weeks.  Diagnosis: Bipolar 2 disorder, major depressive episode (HCC)  Collaboration of Care: Medication Management AEB updated Jose Mitchell on Jose Mitchell's progress  Patient/Guardian was advised Release of Information must be obtained prior to any record release in order to collaborate their care with an outside provider. Patient/Guardian was advised if they have not already done so to contact the registration department to sign all necessary forms in order for us  to release information regarding their care.   Consent: Patient/Guardian gives verbal consent for treatment and assignment of benefits for services provided during this visit. Patient/Guardian expressed understanding and agreed to proceed.   Jose Stare [place], LCSW 01/11/2024

## 2024-02-16 ENCOUNTER — Ambulatory Visit (HOSPITAL_COMMUNITY): Admitting: Licensed Clinical Social Worker

## 2024-02-16 ENCOUNTER — Encounter (HOSPITAL_COMMUNITY): Payer: Self-pay | Admitting: Licensed Clinical Social Worker

## 2024-02-16 DIAGNOSIS — F3181 Bipolar II disorder: Secondary | ICD-10-CM

## 2024-02-16 NOTE — Progress Notes (Signed)
 Virtual Visit via Video Note  I connected with Jose Mitchell on 02/16/24 at  4:30 PM EDT by a video enabled telemedicine application and verified that I am speaking with the correct person using two identifiers.  Location: Patient: home Provider: home office   I discussed the limitations of evaluation and management by telemedicine and the availability of in person appointments. The patient expressed understanding and agreed to proceed.   I discussed the assessment and treatment plan with the patient. The patient was provided an opportunity to ask questions and all were answered. The patient agreed with the plan and demonstrated an understanding of the instructions.   The patient was advised to call back or seek an in-person evaluation if the symptoms worsen or if the condition fails to improve as anticipated.  I provided 45 minutes of non-face-to-face time during this encounter.   Harlene JONELLE Rosser, LCSW   THERAPIST PROGRESS NOTE  Session Time: 4:30pm-5:15pm  Participation Level: Active  Behavioral Response: CasualAlertEuthymic  Type of Therapy: Individual Therapy  Treatment Goals addressed: Treatment Goals addressed: Treatment Goals addressed: Goal: LTG: Jose Mitchell will increase coping skills to promote long-term recovery and improve ability to perform daily activities  Dates: Start:  11/24/23   Expected End:  05/26/24    Disciplines: Interdisciplinary, PROVIDER  Intervention: Charle Jose Mitchell to participate in recovery peer support activities  Dates: Start:  11/24/23    Intervention: Educate Jose Mitchell on relaxation techniques and the rationale for learning these techniques  Dates: Start:  11/24/23    Intervention: Educate Jose Mitchell on cognitive restructuring techniques to address hallucinations and their rationale  Dates: Start:  11/24/23       ProgressTowards Goals: Progressing  Interventions: CBT  Summary: Jose Mitchell is a 43 y.o. male who presents with Bipolar 2, major  depressive episode.   Suicidal/Homicidal: Nowithout intent/plan  Therapist Response: Jose Mitchell engaged well in individual virtual session with Facilities manager.  Clinician utilized CBT to process thoughts feelings and interactions.  Clinician explored updates with mental health, physical health, sleep, and appetite.  Jose Mitchell shared ongoing improvement in overall mental health.  Clinician processed current stressors in his reactions versus past reactions to the same stressor.  Jose Mitchell shared he is more able to take breaks, calm himself down, and return or choose to leave the situation.  Clinician discussed daily routine, coping strategies, and noted improvement in family relationships.  Clinician addressed past addiction struggles and identified significant improvement and changes in his choices, increased responsibility at home and work, and being more financially responsible.  He shared he is doing very well.  Plan: Return again in 4 weeks.  Diagnosis: Bipolar 2 disorder, major depressive episode (HCC)  Collaboration of Care: Medication Management AEB reviewed medical records.  Jose Mitchell reports he is taking medicine most days and he feels positive effects.  Patient/Guardian was advised Release of Information must be obtained prior to any record release in order to collaborate their care with an outside provider. Patient/Guardian was advised if they have not already done so to contact the registration department to sign all necessary forms in order for us  to release information regarding their care.   Consent: Patient/Guardian gives verbal consent for treatment and assignment of benefits for services provided during this visit. Patient/Guardian expressed understanding and agreed to proceed.   Harlene JONELLE McMullin, LCSW 02/16/2024

## 2024-03-27 ENCOUNTER — Ambulatory Visit: Payer: Self-pay

## 2024-03-27 ENCOUNTER — Telehealth (INDEPENDENT_AMBULATORY_CARE_PROVIDER_SITE_OTHER): Admitting: Internal Medicine

## 2024-03-27 DIAGNOSIS — U071 COVID-19: Secondary | ICD-10-CM | POA: Diagnosis not present

## 2024-03-27 MED ORDER — ONDANSETRON HCL 4 MG PO TABS: 4.0000 mg | ORAL_TABLET | Freq: Three times a day (TID) | ORAL | 0 refills | Status: DC | PRN

## 2024-03-27 MED ORDER — NIRMATRELVIR/RITONAVIR (PAXLOVID)TABLET
3.0000 | ORAL_TABLET | Freq: Two times a day (BID) | ORAL | 0 refills | Status: AC
Start: 2024-03-27 — End: 2024-04-01

## 2024-03-27 NOTE — Assessment & Plan Note (Signed)
 Started Paxlovid  considering his chronic medical conditions Zofran  PRN for nausea Mucinex PRN for cough Advised to maintain adequate hydration

## 2024-03-27 NOTE — Telephone Encounter (Signed)
 FYI Only or Action Required?: FYI only for provider.  Patient was last seen in primary care on 10/14/2023 by Zarwolo, Gloria, FNP.  Called Nurse Triage reporting Covid Positive.  Symptoms began several days ago.  Interventions attempted: OTC medications: mucinex, tylenol  and Prescription medications: ibuprofen .  Symptoms are: gradually improving.  Triage Disposition: Home Care  Patient/caregiver understands and will follow disposition?: Yes   Reason for Disposition  [1] COVID-19 diagnosed by positive lab test (e.g., PCR, rapid self-test kit) AND [2] mild symptoms (e.g., cough, fever, others) AND [3] no complications or SOB  Answer Assessment - Initial Assessment Questions 1. COVID-19 DIAGNOSIS: How do you know that you have COVID? (e.g., positive lab test or self-test, diagnosed by doctor or NP/PA, symptoms after exposure).     Tested positive Sunday night 2. COVID-19 EXPOSURE: Was there any known exposure to COVID before the symptoms began? CDC Definition of close contact: within 6 feet (2 meters) for a total of 15 minutes or more over a 24-hour period.      no 3. ONSET: When did the COVID-19 symptoms start?      Saturday night  4. WORST SYMPTOM: What is your worst symptom? (e.g., cough, fever, shortness of breath, muscle aches)     Fever, cough,  5. COUGH: Do you have a cough? If Yes, ask: How bad is the cough?       Mild- coughing up clear phelgm 6. FEVER: Do you have a fever? If Yes, ask: What is your temperature, how was it measured, and when did it start?     Highest was 100.5 on Monday but currently running in 99.0 7. RESPIRATORY STATUS: Describe your breathing? (e.g., normal; shortness of breath, wheezing, unable to speak)      no 8. BETTER-SAME-WORSE: Are you getting better, staying the same or getting worse compared to yesterday?  If getting worse, ask, In what way?     better 9. OTHER SYMPTOMS: Do you have any other symptoms?  (e.g., chills,  fatigue, headache, loss of smell or taste, muscle pain, sore throat)     Headache, nausea,  10. HIGH RISK DISEASE: Do you have any chronic medical problems? (e.g., asthma, heart or lung disease, weak immune system, obesity, etc.)       no 11. VACCINE: Have you had the COVID-19 vaccine? If Yes, ask: Which one, how many shots, when did you get it?       Yes, 2 doses the very first doses.  Protocols used: Coronavirus (COVID-19) Diagnosed or Suspected-A-AH

## 2024-03-27 NOTE — Patient Instructions (Addendum)
 Please start taking Paxlovid  as prescribed.  Please get co-pay card by signing up on this link: [email or web address]  Please take Zofran  as needed for nausea or vomiting.  Please take Mucinex as needed for cough.

## 2024-03-27 NOTE — Telephone Encounter (Signed)
 Appt made.

## 2024-03-27 NOTE — Progress Notes (Signed)
 Virtual Visit via Video Note   Because of Oval Tillett's co-morbid illnesses, he is at least at moderate risk for complications without adequate follow up.  This format is felt to be most appropriate for this patient at this time.  All issues noted in this document were discussed and addressed.  A limited physical exam was performed with this format.      Evaluation Performed:  Follow-up visit  Date:  03/27/2024   ID:  Jose Mitchell, DOB 09-04-1980, MRN [id number]  Patient Location: Home Provider Location: Office/Clinic  Participants: Patient Location of Patient: Home Location of Provider: Telehealth Consent was obtain for visit to be over via telehealth. I verified that I am speaking with the correct person using two identifiers.  PCP:  Zarwolo, Gloria, FNP   Chief Complaint: Nasal congestion, cough and nausea  History of Present Illness:    Jose Mitchell is a 43 y.o. male who has a BID visit for complaint of nasal congestion, cough and nausea for the last 3 days.  He tested positive for COVID 2 days ago.  He has been taking Mucinex as needed for cough.  Denies any recent worsening of dyspnea or wheezing.  Denies any fever or chills in the last 2 days.  The patient does have symptoms concerning for COVID-19 infection (fever, chills, cough, or new shortness of breath).   Past Medical, Surgical, Social History, Allergies, and Medications have been Reviewed.  Past Medical History:  Diagnosis Date   Alcohol abuse    Anxiety reaction 12/18/2021   Bipolar 1 disorder (HCC)    Bipolar 1 disorder, depressed (HCC) 04/08/2020   Bipolar 2 disorder, major depressive episode (HCC) 04/08/2020   Bipolar disorder, current episode depressed, severe, with psychotic features (HCC) 10/24/2023   Chronic right shoulder pain 11/24/2021   Cocaine abuse (HCC)    Epigastric pain 01/27/2022   Gastroesophageal reflux disease 10/04/2022   GERD without esophagitis 12/01/2022   Hemorrhoids  12/01/2022   Hiatal hernia 12/01/2022   HNP (herniated nucleus pulposus), lumbar 10/04/2022   Hx of adenomatous colonic polyps 12/01/2022   Low back pain radiating down leg 02/29/2020   Lumbar stenosis with neurogenic claudication 12/08/2022   Other chronic pain 10/15/2023   Pre-operative clearance 12/03/2022   Restless leg 04/08/2020   Schizoaffective disorder, depressive type (HCC) 01/28/2023   Schizophrenia (HCC)    Schizophrenia, paranoid type (HCC) 06/06/2020   I was diagnosed just don't remember the exact date but it seems it gets worse lately     Sinusitis 10/28/2022   No past surgical history on file.   Current Meds  Medication Sig   nirmatrelvir /ritonavir  (PAXLOVID ) 20 x 150 MG & 10 x 100MG  TABS Take 3 tablets by mouth 2 (two) times daily for 5 days. (Take nirmatrelvir  150 mg two tablets twice daily for 5 days and ritonavir  100 mg one tablet twice daily for 5 days) Patient GFR is >60.   ondansetron  (ZOFRAN ) 4 MG tablet Take 1 tablet (4 mg total) by mouth every 8 (eight) hours as needed for nausea or vomiting.     Allergies:   Sulfamethoxazole, Trimethoprim, and Sulfamethoxazole-trimethoprim   ROS:   Please see the history of present illness. All other systems reviewed and are negative.   Labs/Other Tests and Data Reviewed:    Recent Labs: 10/14/2023: TSH 0.522 10/24/2023: ALT 28; BUN 12; Creatinine, Ser 1.11; Hemoglobin 15.9; Platelets 291; Potassium 3.9; Sodium 138   Recent Lipid Panel Lab Results  Component Value Date/Time  CHOL 167 10/14/2023 09:55 AM   TRIG 115 10/14/2023 09:55 AM   HDL 40 10/14/2023 09:55 AM   CHOLHDL 4.2 10/14/2023 09:55 AM   LDLCALC 106 (H) 10/14/2023 09:55 AM    Wt Readings from Last 3 Encounters:  10/14/23 235 lb 1.9 oz (106.6 kg)  08/19/23 237 lb 0.6 oz (107.5 kg)  02/03/23 207 lb (93.9 kg)     Objective:    Vital Signs:  There were no vitals taken for this visit.   VITAL SIGNS:  reviewed GEN:  no acute distress EYES:   sclerae anicteric, EOMI - Extraocular Movements Intact RESPIRATORY:  normal respiratory effort, symmetric expansion NEURO:  alert and oriented x 3, no obvious focal deficit PSYCH:  normal affect  ASSESSMENT & PLAN:    COVID-19 virus infection Started Paxlovid  considering his chronic medical conditions Zofran  PRN for nausea Mucinex PRN for cough Advised to maintain adequate hydration Work note provided    I discussed the assessment and treatment plan with the patient. The patient was provided an opportunity to ask questions, and all were answered. The patient agreed with the plan and demonstrated an understanding of the instructions.   The patient was advised to call back or seek an in-person evaluation if the symptoms worsen or if the condition fails to improve as anticipated.  The above assessment and management plan was discussed with the patient. The patient verbalized understanding of and has agreed to the management plan.   Medication Adjustments/Labs and Tests Ordered: Current medicines are reviewed at length with the patient today.  Concerns regarding medicines are outlined above.   Tests Ordered: No orders of the defined types were placed in this encounter.   Medication Changes: Meds ordered this encounter  Medications   nirmatrelvir /ritonavir  (PAXLOVID ) 20 x 150 MG & 10 x 100MG  TABS    Sig: Take 3 tablets by mouth 2 (two) times daily for 5 days. (Take nirmatrelvir  150 mg two tablets twice daily for 5 days and ritonavir  100 mg one tablet twice daily for 5 days) Patient GFR is >60.    Dispense:  30 tablet    Refill:  0   ondansetron  (ZOFRAN ) 4 MG tablet    Sig: Take 1 tablet (4 mg total) by mouth every 8 (eight) hours as needed for nausea or vomiting.    Dispense:  20 tablet    Refill:  0     Note: This dictation was prepared with Dragon dictation along with smaller phrase technology. Similar sounding words can be transcribed inadequately or may not be corrected upon  review. Any transcriptional errors that result from this process are unintentional.      Disposition:  Follow up  Signed, Suzzane MARLA Blanch, MD  03/27/2024 2:38 PM     [facility]

## 2024-03-27 NOTE — Telephone Encounter (Signed)
 Copied from CRM [id number]. Topic: Clinical - Red Word Triage >> Mar 27, 2024  8:19 AM Antwanette L wrote: Red Word that prompted transfer to Nurse Triage: Patient took an at home covid test on Sunday and it came back positive. Patient is experiencing body aches, headache, fever was 99 today, very sore and fatigue

## 2024-04-16 ENCOUNTER — Ambulatory Visit: Admitting: Family Medicine

## 2024-04-26 ENCOUNTER — Telehealth: Payer: Self-pay | Admitting: Family Medicine

## 2024-04-26 NOTE — Telephone Encounter (Signed)
 Copied from CRM [id number]. Topic: General - Other >> Apr 26, 2024 10:06 AM Olam RAMAN wrote: Reason for CRM:  Pt is needing a physical done for his work before 05/25/24 and can not wait for a far appt CB [phone]

## 2024-04-26 NOTE — Telephone Encounter (Signed)
Pt on wait list

## 2024-05-15 ENCOUNTER — Encounter

## 2024-05-15 ENCOUNTER — Other Ambulatory Visit: Payer: Self-pay | Admitting: Family Medicine

## 2024-05-15 DIAGNOSIS — K219 Gastro-esophageal reflux disease without esophagitis: Secondary | ICD-10-CM

## 2024-05-15 NOTE — Progress Notes (Unsigned)
 Patient ID: Jose Mitchell, male   DOB: 09/27/1980, 43 y.o.   MRN: [id number]  Patient attempting to establish care.  Hostile behavior to the front desk, and medical assistant both.  When asked about drug use he denied use for decades.  When asked about recent cocaine use on drug screen in March he became agitated and began to be rude and sarcastic.    Patient not a good fit for our office due to behaviors overall and this will make it impossible to have a productive therapeutic relationship.  Advised to keep his PCP with which whom he is already established.   This encounter was created in error - please disregard.

## 2024-05-15 NOTE — Telephone Encounter (Unsigned)
 Copied from CRM #[id number]. Topic: Clinical - Medical Advice >> May 15, 2024  9:37 AM Anairis L wrote: Reason for CRM: Hadassah GAILS HR at [facility] is calling in because she is part of Mr.Marsolek HR department and she wants patient to be rescheduled for a physical.    Please advise/Call patient.

## 2024-05-15 NOTE — Progress Notes (Unsigned)
 0   New Patient Visit   Physician: Nancee Brownrigg A Kenesha Moshier, MD  Patient: Jose Mitchell   DOB: 07-May-1981   43 y.o. Male  MRN: [id number] Visit Date: 05/15/2024   Chief Complaint  Patient presents with   Establish Care   Subjective  [name] is a 43 y.o. male who presents today as a new patient to establish care.   HPI  Discussed the use of AI scribe software for clinical note transcription with the patient, who gave verbal consent to proceed.  History of Present Illness             ASSESSMENT & PLAN  No diagnosis found.  No orders of the defined types were placed in this encounter.   Assessment and Plan                  Objective  BP 120/80   Pulse 89   Wt 223 lb 9.6 oz (101.4 kg)   SpO2 97%   BMI 27.95 kg/m  {Insert last BP/Wt (optional):23777}{See vitals history (optional):1}    Review of Systems  Constitutional:  Negative for chills, fever and weight loss.  Eyes:  Negative for blurred vision. h Respiratory:  Negative for cough and shortness of breath.   Cardiovascular:  Negative for chest pain and palpitations.  Skin:  Negative for rash.  Psychiatric/Behavioral:  Negative for depression. The patient is not nervous/anxious.      Physical Exam Physical Exam Vitals reviewed.  Constitutional:      Appearance: Normal appearance. Well-developed with normal weight.  HENT:     Head: Normocephalic and atraumatic.  Normal mucous membranes, no oral lesions Eyes:     Pupils: Pupils are equal, round, and reactive to light.  Neck:     Thyroid: No thyroid mass or thyromegaly.  Cardiovascular:     Rate and Rhythm: Normal rate and regular rhythm. Normal heart sounds. Normal peripheral pulses Pulmonary:     Normal breath sounds with normal effort Abdominal:   Abdomen is soft, without tenderness or noted hepatosplenomegaly Musculoskeletal:        General: No swelling or edema  Lymphadenopathy:     Cervical: No cervical adenopathy.  Skin:     General: Skin is warm and dry without noticeable rash. Neurological:     General: No focal deficit present.  Psychiatric:        Mood and Affect: Mood, behavior and cognition normal   Past Medical History:  Diagnosis Date   Alcohol abuse    Anxiety reaction 12/18/2021   Bipolar 1 disorder (HCC)    Bipolar 1 disorder, depressed (HCC) 04/08/2020   Bipolar 2 disorder, major depressive episode (HCC) 04/08/2020   Bipolar disorder, current episode depressed, severe, with psychotic features (HCC) 10/24/2023   Chronic right shoulder pain 11/24/2021   Cocaine abuse (HCC)    Epigastric pain 01/27/2022   Gastroesophageal reflux disease 10/04/2022   GERD without esophagitis 12/01/2022   Hemorrhoids 12/01/2022   Hiatal hernia 12/01/2022   HNP (herniated nucleus pulposus), lumbar 10/04/2022   Hx of adenomatous colonic polyps 12/01/2022   Low back pain radiating down leg 02/29/2020   Lumbar stenosis with neurogenic claudication 12/08/2022   Other chronic pain 10/15/2023   Pre-operative clearance 12/03/2022   Restless leg 04/08/2020   Schizoaffective disorder, depressive type (HCC) 01/28/2023   Schizophrenia (HCC)    Schizophrenia, paranoid type (HCC) 06/06/2020   I was diagnosed just don't remember the exact date but it seems it gets worse lately  Sinusitis 10/28/2022   No past surgical history on file. Family Status  Relation Name Status   Mother  Alive   Father  Alive   Brother  Alive   MGM  Deceased   MGF  Deceased   PGM  Deceased   PGF  Deceased  No partnership data on file   Family History  Family history unknown: Yes   Social History   Socioeconomic History   Marital status: Single    Spouse name: Not on file   Number of children: Not on file   Years of education: Not on file   Highest education level: GED or equivalent  Occupational History   Not on file  Tobacco Use   Smoking status: Former    Current packs/day: 0.00    Types: Cigarettes    Quit date: 2021     Years since quitting: 4.8   Smokeless tobacco: Current    Types: Chew  Vaping Use   Vaping status: Never Used  Substance and Sexual Activity   Alcohol use: Not Currently   Drug use: Not Currently    Types: Cocaine   Sexual activity: Not Currently  Other Topics Concern   Not on file  Social History Narrative   Not on file   Social Drivers of Health   Financial Resource Strain: Low Risk  (05/11/2024)   Overall Financial Resource Strain (CARDIA)    Difficulty of Paying Living Expenses: Not very hard  Food Insecurity: No Food Insecurity (05/11/2024)   Hunger Vital Sign    Worried About Running Out of Food in the Last Year: Never true    Ran Out of Food in the Last Year: Never true  Transportation Needs: No Transportation Needs (05/11/2024)   PRAPARE - Administrator, Civil Service (Medical): No    Lack of Transportation (Non-Medical): No  Physical Activity: Insufficiently Active (05/11/2024)   Exercise Vital Sign    Days of Exercise per Week: 3 days    Minutes of Exercise per Session: 30 min  Stress: No Stress Concern Present (05/11/2024)   [employer] of Occupational Health - Occupational Stress Questionnaire    Feeling of Stress: Only a little  Social Connections: Moderately Isolated (05/11/2024)   Social Connection and Isolation Panel    Frequency of Communication with Friends and Family: More than three times a week    Frequency of Social Gatherings with Friends and Family: Once a week    Attends Religious Services: More than 4 times per year    Active Member of [employer] or Organizations: No    Attends Engineer, structural: Not on file    Marital Status: Divorced   Outpatient Medications Prior to Visit  Medication Sig   pantoprazole  (PROTONIX ) 40 MG tablet Take 1 tablet (40 mg total) by mouth daily.   Vitamin D , Ergocalciferol , (DRISDOL ) 1.25 MG (50000 UNIT) CAPS capsule Take 1 capsule (50,000 Units total) by mouth every 7 (seven) days.    Cholecalciferol  125 MCG (5000 UT) TABS Take 1 tablet (5,000 Units total) by mouth daily.   gabapentin  (NEURONTIN ) 400 MG capsule Take 1 capsule (400 mg total) by mouth 3 (three) times daily.   ondansetron  (ZOFRAN ) 4 MG tablet Take 1 tablet (4 mg total) by mouth every 8 (eight) hours as needed for nausea or vomiting.   prazosin  (MINIPRESS ) 2 MG capsule Take 1 capsule (2 mg total) by mouth at bedtime.   No facility-administered medications prior to visit.   Allergies  Allergen  Reactions   Sulfamethoxazole    Trimethoprim Nausea And Vomiting   Sulfamethoxazole-Trimethoprim Rash and Other (See Comments)    Childhood allergy. Does not know reaction    Immunization History  Administered Date(s) Administered   Moderna Sars-Covid-2 Vaccination 07/30/2021   Tdap 03/12/2017    Health Maintenance  Topic Date Due   Hepatitis B Vaccines 19-59 Average Risk (1 of 3 - 19+ 3-dose series) Never done   HPV VACCINES (1 - Risk 3-dose SCDM series) Never done   Influenza Vaccine  Never done   COVID-19 Vaccine (2 - 2025-26 season) 03/26/2024   DTaP/Tdap/Td (2 - Td or Tdap) 03/13/2027   Hepatitis C Screening  Completed   HIV Screening  Completed   Pneumococcal Vaccine  Aged Out   Meningococcal B Vaccine  Aged Out    Patient Care Team: Everlene Parris LABOR, MD as PCP - General (Family Medicine)  Depression Screen    10/14/2023    9:03 AM 11/09/2022   10:32 AM 10/28/2022    9:18 AM 10/28/2022    9:15 AM  PHQ 2/9 Scores  PHQ - 2 Score 5 5 1 1   PHQ- 9 Score 15 18 11 11      Parris LABOR Everlene, MD  [facility] [phone] (phone) [phone] (fax)  [facility]

## 2024-05-21 ENCOUNTER — Telehealth (HOSPITAL_BASED_OUTPATIENT_CLINIC_OR_DEPARTMENT_OTHER): Admitting: Family

## 2024-05-21 DIAGNOSIS — F3170 Bipolar disorder, currently in remission, most recent episode unspecified: Secondary | ICD-10-CM | POA: Diagnosis not present

## 2024-05-21 NOTE — Progress Notes (Unsigned)
 Virtual Visit via Video Note  I connected with Jose Mitchell on 05/21/24 at  4:00 PM EDT by a video enabled telemedicine application and verified that I am speaking with the correct person using two identifiers.  Location: Patient: Home Provider: Office   I discussed the limitations of evaluation and management by telemedicine and the availability of in person appointments. The patient expressed understanding and agreed to proceed.   I discussed the assessment and treatment plan with the patient. The patient was provided an opportunity to ask questions and all were answered. The patient agreed with the plan and demonstrated an understanding of the instructions.   The patient was advised to call back or seek an in-person evaluation if the symptoms worsen or if the condition fails to improve as anticipated.  I provided 17 minutes of non-face-to-face time during this encounter.   Jose LOISE Kerns, NP   [facility] MD/PA/NP OP Progress Note  05/21/2024 4:06 PM Jose Mitchell  MRN:  [id number]  Chief Complaint: Jose Mitchell stated  I am doing good, But I do have good and bad days.   HPI: Jose Mitchell 43 year old male seen and evaluated via virtual platform careagility.  He has an extensive psychiatric history with schizoaffective disorder, schizophrenia, posttraumatic stress disorder, major depressive disorder and generalized anxiety disorder.  He reports he has discontinued all psychotropic medications and is feeling great.  States he does continue to struggle with good and bad days, but stated that his mind feels a lot clearer without medication.  Jose Mitchell denied paranoia or auditory hallucinations,  I prayed all that away.  Reports when he gets stressed and his mind starts racing states he is able to utilize coping skills and immersed self with work.    Jose Mitchell reports he is not interested in starting any new medications at this time.  No concerns related to suicidal or homicidal ideations.  Denied  auditory visual hallucinations.  Patient encouraged to follow-up with therapy services states he had 1 visit with the therapist but overall does not feel that he needs therapy at this time either.  Reports a good appetite.  States he is resting well throughout the night.  He denied paranoia or delusional ideations.  Discussed continue to monitor for symptoms and follow-up as needed.  Support, encouragement and  reassurance was provided.   Visit Diagnosis:    ICD-10-CM   1. Depression, major, in remission  F32.5       Past Psychiatric History:   Past Medical History:  Past Medical History:  Diagnosis Date   Alcohol abuse    Anxiety reaction 12/18/2021   Bipolar 1 disorder (HCC)    Bipolar 1 disorder, depressed (HCC) 04/08/2020   Bipolar 2 disorder, major depressive episode (HCC) 04/08/2020   Bipolar disorder, current episode depressed, severe, with psychotic features (HCC) 10/24/2023   Chronic right shoulder pain 11/24/2021   Cocaine abuse (HCC)    Epigastric pain 01/27/2022   Gastroesophageal reflux disease 10/04/2022   GERD without esophagitis 12/01/2022   Hemorrhoids 12/01/2022   Hiatal hernia 12/01/2022   HNP (herniated nucleus pulposus), lumbar 10/04/2022   Hx of adenomatous colonic polyps 12/01/2022   Low back pain radiating down leg 02/29/2020   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  Family Psychiatric History:   Family History:  Family History  Family history unknown: Yes    Social History:  Social History   Socioeconomic History   Marital status: Single    Spouse name:  Not on file   Number of children: Not on file   Years of education: Not on file   Highest education level: GED or equivalent  Occupational History   Not on file  Tobacco Use   Smoking status: Former    Current packs/day: 0.00    Types: Cigarettes    Quit date: 2021    Years since quitting: 4.8   Smokeless tobacco: Current    Types: Chew  Vaping Use   Vaping status: Never Used  Substance and Sexual Activity   Alcohol use: Not Currently   Drug use: Not Currently    Types: Cocaine   Sexual activity: Not Currently  Other Topics Concern   Not on file  Social History Narrative   Not on file   Social Drivers of Health   Financial Resource Strain: Low Risk  (05/11/2024)   Overall Financial Resource Strain (CARDIA)    Difficulty of Paying Living Expenses: Not very hard  Food Insecurity: No Food Insecurity (05/11/2024)   Hunger Vital Sign    Worried About Running Out of Food in the Last Year: Never true    Ran Out of Food in the Last Year: Never true  Transportation Needs: No Transportation Needs (05/11/2024)   PRAPARE - Administrator, Civil Service (Medical): No    Lack of Transportation (Non-Medical): No  Physical Activity: Insufficiently Active (05/11/2024)   Exercise Vital Sign    Days of Exercise per Week: 3 days    Minutes of Exercise per Session: 30 min  Stress: No Stress Concern Present (05/11/2024)   [employer] of Occupational Health - Occupational Stress Questionnaire    Feeling of Stress: Only a little  Social Connections: Moderately Isolated (05/11/2024)   Social Connection and Isolation Panel    Frequency of Communication with Friends and Family: More than three times a week    Frequency of Social Gatherings with Friends and Family: Once a week    Attends Religious Services: More than 4 times per year    Active Member of [employer] or Organizations: No    Attends Engineer, Structural: Not on file    Marital Status: Divorced     Allergies:  Allergies  Allergen Reactions   Sulfamethoxazole    Trimethoprim Nausea And Vomiting   Sulfamethoxazole-Trimethoprim Rash and Other (See Comments)    Childhood allergy. Does not know reaction    Metabolic Disorder Labs: Lab Results  Component Value Date   HGBA1C 6.1 (H) 10/14/2023   No results found for: PROLACTIN Lab Results  Component Value Date   CHOL 167 10/14/2023   TRIG 115 10/14/2023   HDL 40 10/14/2023   CHOLHDL 4.2 10/14/2023   LDLCALC 106 (H) 10/14/2023   LDLCALC 102 (H) 10/28/2022   Lab Results  Component Value Date   TSH 0.522 10/14/2023   TSH 0.551 10/28/2022    Therapeutic Level Labs: No results found for: LITHIUM No results found for: VALPROATE No results found for: CBMZ  Current Medications: Current Outpatient Medications  Medication Sig Dispense Refill   pantoprazole  (PROTONIX ) 40 MG tablet Take 1 tablet by mouth once daily 30 tablet  0   prazosin  (MINIPRESS ) 2 MG capsule Take 1 capsule (2 mg total) by mouth at bedtime. 60 capsule 0   Vitamin D , Ergocalciferol , (DRISDOL ) 1.25 MG (50000 UNIT) CAPS capsule Take 1 capsule (50,000 Units total) by mouth every 7 (seven) days. 5 capsule 1   No current facility-administered medications for this visit.     Musculoskeletal:    Psychiatric Specialty Exam: Review of Systems  There were no vitals taken for this visit.There is no height or weight on file to calculate BMI.  General Appearance: Casual  Eye Contact:  Good  Speech:  Clear and Coherent  Volume:  Normal  Mood:  Euthymic  Affect:  Congruent  Thought Process:  Coherent  Orientation:  Full (Time, Place, and Person)  Thought Content: WDL   Suicidal Thoughts:  No  Homicidal Thoughts:  No  Memory:  Immediate;   Good Recent;   Good  Judgement:  Good  Insight:  Good  Psychomotor Activity:  Normal  Concentration:  Concentration: Good  Recall:  Good  Fund of Knowledge: Good  Language: Good  Akathisia:  No  Handed:   Right  AIMS (if indicated): not done  Assets:  Communication Skills Desire for Improvement  ADL's:  Intact  Cognition: WNL  Sleep:  Good   Screenings: AUDIT    Flowsheet Row [facility]  Alcohol Use Disorder Identification Test Final Score (AUDIT) 0   GAD-7    Flowsheet Row Office Visit from 10/14/2023 in [facility] Office Visit from 11/09/2022 in [facility] Office Visit from 10/28/2022 in [facility] Office Visit from 10/15/2022 in [facility] Office Visit from 10/04/2022 in [facility]  Total GAD-7 Score 16 10 9 8 15    PHQ2-9    Flowsheet Row Office Visit from 10/14/2023 in [facility] Office Visit from 11/09/2022 in [facility] Office Visit from 10/28/2022 in [facility] Care Office Visit from 10/15/2022 in [facility] Care Office Visit from 10/04/2022 in [facility] Care  PHQ-2 Total Score 5 5 1 4 5   PHQ-9 Total Score 15 18 11 13 16    Flowsheet Row UC from 11/29/2023 in [facility] [place] Most recent reading at 11/29/2023  3:43 PM ED from 11/29/2023 in [facility] Most recent reading at 11/29/2023  2:51 PM Admission (Discharged) [facility] Most recent reading at 10/24/2023  6:00 PM  C-SSRS RISK CATEGORY No Risk No Risk No Risk     Assessment and Plan: Hilman Kissling 43 year old male presents for follow-up appointment.  He reports discontinuing all medications at previous assessment 12/2023.  States he has not taken medication since previous visit.  States he is feeling a lot better without any psychotropic medications.  States that his paranoia has resolved.   I prayed all that stuff away.  Denied  illicit drug use or substance abuse currently.  States he has been working and attending to his daily ADLs.  No concerns with suicidal or homicidal ideations.  He reports a good appetite.  States he is resting well through the night.  Discussed following up as needed.    Patient encouraged to follow-up with therapist for monthly/biweekly progress and help with relapse/ stability. He appeared receptive to plan.  Support, encouragement and  reassurance was provided.  Collaboration of Care: Collaboration of Care: Medication Management AEB self discontinued all medications  Patient/Guardian was advised Release of Information must be obtained prior to any record release in order to collaborate their care with an outside provider. Patient/Guardian was advised if they have not already done so to contact the registration department to sign all necessary forms in order for us  to release information regarding their care.   Consent: Patient/Guardian gives verbal consent for treatment and assignment of benefits for services provided during this visit. Patient/Guardian expressed understanding and agreed to proceed.    Jose LOISE Kerns, NP 05/21/2024, 4:06 PM

## 2024-05-22 ENCOUNTER — Ambulatory Visit (INDEPENDENT_AMBULATORY_CARE_PROVIDER_SITE_OTHER): Admitting: Internal Medicine

## 2024-05-22 ENCOUNTER — Encounter: Payer: Self-pay | Admitting: Internal Medicine

## 2024-05-22 VITALS — BP 117/71 | HR 72 | Ht 75.0 in | Wt 225.2 lb

## 2024-05-22 DIAGNOSIS — E782 Mixed hyperlipidemia: Secondary | ICD-10-CM | POA: Diagnosis not present

## 2024-05-22 DIAGNOSIS — K219 Gastro-esophageal reflux disease without esophagitis: Secondary | ICD-10-CM

## 2024-05-22 DIAGNOSIS — Z0001 Encounter for general adult medical examination with abnormal findings: Secondary | ICD-10-CM | POA: Diagnosis not present

## 2024-05-22 DIAGNOSIS — R7303 Prediabetes: Secondary | ICD-10-CM | POA: Diagnosis not present

## 2024-05-22 DIAGNOSIS — Z2821 Immunization not carried out because of patient refusal: Secondary | ICD-10-CM

## 2024-05-22 DIAGNOSIS — F3177 Bipolar disorder, in partial remission, most recent episode mixed: Secondary | ICD-10-CM | POA: Insufficient documentation

## 2024-05-22 MED ORDER — PANTOPRAZOLE SODIUM 40 MG PO TBEC: 40.0000 mg | DELAYED_RELEASE_TABLET | Freq: Every day | ORAL | 5 refills | Status: DC

## 2024-05-22 NOTE — Assessment & Plan Note (Signed)
 Physical exam as documented. Fasting blood tests ordered today. Refused flu vaccine today.

## 2024-05-22 NOTE — Progress Notes (Signed)
 Established Patient Office Visit  Subjective:  Patient ID: Jose Mitchell, male    DOB: [birth year]  Age: 43 y.o. MRN: [id number]  CC:  Chief Complaint  Patient presents with   Annual Exam    Cpe     HPI [name] is a 43 y.o. male with past medical history of bipolar disorder, HLD and prediabetes who presents for annual physical.  Bipolar disorder: He had a visit with his psychiatric provider yesterday, and reported that he has stopped taking all his antipsychotic medications for the last 3 months.  He has felt better since stopping the medication.  Denies any episode of delusion or hallucination.  Does not report anhedonia, SI or HI currently.  GERD: He currently takes pantoprazole  40 mg QD for GERD.  Denies nausea or vomiting.   Past Medical History:  Diagnosis Date   Alcohol abuse    Anxiety reaction 12/18/2021   Bipolar 1 disorder (HCC)    Bipolar 1 disorder, depressed (HCC) 04/08/2020   Bipolar 2 disorder, major depressive episode (HCC) 04/08/2020   Bipolar disorder, current episode depressed, severe, with psychotic features (HCC) 10/24/2023   Chronic right shoulder pain 11/24/2021   Cocaine abuse (HCC)    Epigastric pain 01/27/2022   Gastroesophageal reflux disease 10/04/2022   GERD without esophagitis 12/01/2022   Hemorrhoids 12/01/2022   Hiatal hernia 12/01/2022   HNP (herniated nucleus pulposus), lumbar 10/04/2022   Hx of adenomatous colonic polyps 12/01/2022   Low back pain radiating down leg 02/29/2020   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 No pertinent surgical  history.  Family History  Family history unknown: Yes    Social History   Socioeconomic History   Marital status: Single    Spouse name: Not on file   Number of children: Not on file   Years of education: Not on file   Highest education level: GED or equivalent  Occupational History   Not on file  Tobacco Use   Smoking status: Former    Current packs/day: 0.00    Types: Cigarettes    Quit date: 2021    Years since quitting: 4.8   Smokeless tobacco: Current    Types: Chew  Vaping Use   Vaping status: Never Used  Substance and Sexual Activity   Alcohol use: Not Currently   Drug use: Not Currently    Types: Cocaine   Sexual activity: Not Currently  Other Topics Concern   Not on file  Social History Narrative   Not on file   Social Drivers of Health   Financial Resource Strain: Low Risk  (05/11/2024)   Overall Financial Resource Strain (CARDIA)    Difficulty of Paying Living Expenses: Not very hard  Food Insecurity: No Food Insecurity (05/11/2024)   Hunger Vital Sign    Worried About Running Out of Food in the Last Year: Never true    Ran Out of Food in the Last Year: Never true  Transportation Needs: No Transportation Needs (05/11/2024)   PRAPARE - Administrator, Civil Service (Medical): No    Lack of Transportation (Non-Medical): No  Physical Activity: Insufficiently  Active (05/11/2024)   Exercise Vital Sign    Days of Exercise per Week: 3 days    Minutes of Exercise per Session: 30 min  Stress: No Stress Concern Present (05/11/2024)   [employer] of Occupational Health - Occupational Stress Questionnaire    Feeling of Stress: Only a little  Social Connections: Moderately Isolated (05/11/2024)   Social Connection and Isolation Panel    Frequency of Communication with Friends and Family: More than three times a week    Frequency of Social Gatherings with Friends and Family: Once a week    Attends Religious Services: More than 4 times per year     Active Member of [employer] or Organizations: No    Attends Banker Meetings: Not on file    Marital Status: Divorced  Intimate Partner Violence: Not At Risk (10/24/2023)   Humiliation, Afraid, Rape, and Kick questionnaire    Fear of Current or Ex-Partner: No    Emotionally Abused: No    Physically Abused: No    Sexually Abused: No    Outpatient Medications Prior to Visit  Medication Sig Dispense Refill   pantoprazole  (PROTONIX ) 40 MG tablet Take 1 tablet by mouth once daily 30 tablet 0   Vitamin D , Ergocalciferol , (DRISDOL ) 1.25 MG (50000 UNIT) CAPS capsule Take 1 capsule (50,000 Units total) by mouth every 7 (seven) days. 5 capsule 1   prazosin  (MINIPRESS ) 2 MG capsule Take 1 capsule (2 mg total) by mouth at bedtime. 60 capsule 0   No facility-administered medications prior to visit.    Allergies  Allergen Reactions   Sulfamethoxazole    Trimethoprim Nausea And Vomiting   Sulfamethoxazole-Trimethoprim Rash and Other (See Comments)    Childhood allergy. Does not know reaction    ROS Review of Systems  Constitutional:  Negative for chills and fever.  HENT:  Negative for congestion and sore throat.   Eyes:  Negative for pain and discharge.  Respiratory:  Negative for cough and shortness of breath.   Cardiovascular:  Negative for chest pain and palpitations.  Gastrointestinal:  Negative for diarrhea, nausea and vomiting.  Endocrine: Negative for polydipsia and polyuria.  Genitourinary:  Negative for dysuria and hematuria.  Musculoskeletal:  Negative for neck pain and neck stiffness.  Skin:  Negative for rash.  Neurological:  Negative for dizziness, weakness, numbness and headaches.  Psychiatric/Behavioral:  Negative for agitation and behavioral problems.       Objective:    Physical Exam Vitals reviewed.  Constitutional:      General: He is not in acute distress.    Appearance: He is not diaphoretic.  HENT:     Head: Normocephalic and atraumatic.      Nose: Nose normal.     Mouth/Throat:     Mouth: Mucous membranes are moist.  Eyes:     General: No scleral icterus.    Extraocular Movements: Extraocular movements intact.  Cardiovascular:     Rate and Rhythm: Normal rate and regular rhythm.     Heart sounds: Normal heart sounds. No murmur heard. Pulmonary:     Breath sounds: Normal breath sounds. No wheezing or rales.  Abdominal:     Palpations: Abdomen is soft.     Tenderness: There is no abdominal tenderness.  Musculoskeletal:     Cervical back: Neck supple. No tenderness.     Right lower leg: No edema.     Left lower leg: No edema.  Skin:    General: Skin is warm.  Findings: No rash.  Neurological:     General: No focal deficit present.     Mental Status: He is alert and oriented to person, place, and time.     Cranial Nerves: No cranial nerve deficit.     Sensory: No sensory deficit.     Motor: No weakness.  Psychiatric:        Mood and Affect: Mood normal.        Behavior: Behavior normal.     BP 117/71   Pulse 72   Ht 6' 3 (1.905 m)   Wt 225 lb 3.2 oz (102.2 kg)   SpO2 97%   BMI 28.15 kg/m  Wt Readings from Last 3 Encounters:  05/22/24 225 lb 3.2 oz (102.2 kg)  05/15/24 223 lb 9.6 oz (101.4 kg)  10/24/23 219 lb (99.3 kg)    Lab Results  Component Value Date   TSH 0.522 10/14/2023   Lab Results  Component Value Date   WBC 15.4 (H) 10/24/2023   HGB 15.9 10/24/2023   HCT 46.5 10/24/2023   MCV 86.9 10/24/2023   PLT 291 10/24/2023   Lab Results  Component Value Date   NA 138 10/24/2023   K 3.9 10/24/2023   CO2 23 10/24/2023   GLUCOSE 122 (H) 10/24/2023   BUN 12 10/24/2023   CREATININE 1.11 10/24/2023   BILITOT 0.6 10/24/2023   ALKPHOS 45 10/24/2023   AST 26 10/24/2023   ALT 28 10/24/2023   PROT 7.5 10/24/2023   ALBUMIN 4.5 10/24/2023   CALCIUM 9.6 10/24/2023   ANIONGAP 12 10/24/2023   EGFR 74 10/14/2023   Lab Results  Component Value Date   CHOL 167 10/14/2023   Lab Results   Component Value Date   HDL 40 10/14/2023   Lab Results  Component Value Date   LDLCALC 106 (H) 10/14/2023   Lab Results  Component Value Date   TRIG 115 10/14/2023   Lab Results  Component Value Date   CHOLHDL 4.2 10/14/2023   Lab Results  Component Value Date   HGBA1C 6.1 (H) 10/14/2023      Assessment & Plan:   Problem List Items Addressed This Visit       Digestive   Gastroesophageal reflux disease (Chronic)   Well-controlled with pantoprazole  40 mg QD, refilled Avoid hot and spicy food      Relevant Medications   pantoprazole  (PROTONIX ) 40 MG tablet     Other   Encounter for general adult medical examination with abnormal findings - Primary   Physical exam as documented. Fasting blood tests ordered today. Refused flu vaccine today.      Bipolar disorder, in partial remission, most recent episode mixed (HCC)   Overall well-controlled Not on any antipsychotic medications currently Followed by psychiatry - recently discharged as he is feeling better now      Relevant Orders   CBC with Differential/Platelet   CMP14+EGFR   TSH   Prediabetes   Lab Results  Component Value Date   HGBA1C 6.1 (H) 10/14/2023   Advised to follow low carb diet      Relevant Orders   CMP14+EGFR   Hemoglobin A1c   Mixed hyperlipidemia   Check lipid profile Advised to follow low-carb diet      Relevant Orders   Lipid Profile   Other Visit Diagnoses       Refused influenza vaccine           Meds ordered this encounter  Medications   pantoprazole  (PROTONIX ) 40  MG tablet    Sig: Take 1 tablet (40 mg total) by mouth daily.    Dispense:  30 tablet    Refill:  5    Follow-up: Return in about 1 year (around 05/22/2025).    Suzzane MARLA Blanch, MD

## 2024-05-22 NOTE — Assessment & Plan Note (Signed)
 Overall well-controlled Not on any antipsychotic medications currently Followed by psychiatry - recently discharged as he is feeling better now

## 2024-05-22 NOTE — Assessment & Plan Note (Signed)
 Well-controlled with pantoprazole  40 mg QD, refilled Avoid hot and spicy food

## 2024-05-22 NOTE — Assessment & Plan Note (Signed)
Check lipid profile Advised to follow low carb diet

## 2024-05-22 NOTE — Assessment & Plan Note (Signed)
 Lab Results  Component Value Date   HGBA1C 6.1 (H) 10/14/2023   Advised to follow low carb diet

## 2024-05-22 NOTE — Patient Instructions (Signed)
 Please take Vitamin D  2000 IU once daily.  Please continue to cut down -> quit smoking.

## 2024-05-23 DIAGNOSIS — F3177 Bipolar disorder, in partial remission, most recent episode mixed: Secondary | ICD-10-CM | POA: Diagnosis not present

## 2024-05-23 DIAGNOSIS — R7303 Prediabetes: Secondary | ICD-10-CM | POA: Diagnosis not present

## 2024-05-23 DIAGNOSIS — E782 Mixed hyperlipidemia: Secondary | ICD-10-CM | POA: Diagnosis not present

## 2024-05-24 ENCOUNTER — Ambulatory Visit: Payer: Self-pay | Admitting: Internal Medicine

## 2024-05-24 LAB — HEMOGLOBIN A1C
Est. average glucose Bld gHb Est-mCnc: 123 mg/dL
Hgb A1c MFr Bld: 5.9 % — ABNORMAL HIGH (ref 4.8–5.6)

## 2024-05-24 LAB — LIPID PANEL
Chol/HDL Ratio: 3.2 ratio (ref 0.0–5.0)
Cholesterol, Total: 144 mg/dL (ref 100–199)
HDL: 45 mg/dL (ref >39)
LDL Chol Calc (NIH): 81 mg/dL (ref 0–99)
Triglycerides: 97 mg/dL (ref 0–149)
VLDL Cholesterol Cal: 18 mg/dL (ref 5–40)

## 2024-05-24 LAB — CMP14+EGFR
ALT: 15 IU/L (ref 0–44)
AST: 17 IU/L (ref 0–40)
Albumin: 4.7 g/dL (ref 4.1–5.1)
Alkaline Phosphatase: 66 IU/L (ref 47–123)
BUN/Creatinine Ratio: 7 — ABNORMAL LOW (ref 9–20)
BUN: 8 mg/dL (ref 6–24)
Bilirubin Total: 0.3 mg/dL (ref 0.0–1.2)
CO2: 23 mmol/L (ref 20–29)
Calcium: 9.8 mg/dL (ref 8.7–10.2)
Chloride: 100 mmol/L (ref 96–106)
Creatinine, Ser: 1.11 mg/dL (ref 0.76–1.27)
Globulin, Total: 2.2 g/dL (ref 1.5–4.5)
Glucose: 77 mg/dL (ref 70–99)
Potassium: 4.1 mmol/L (ref 3.5–5.2)
Sodium: 140 mmol/L (ref 134–144)
Total Protein: 6.9 g/dL (ref 6.0–8.5)
eGFR: 84 mL/min/1.73 (ref >59)

## 2024-05-24 LAB — CBC WITH DIFFERENTIAL/PLATELET
Basophils Absolute: 0.1 x10E3/uL (ref 0.0–0.2)
Basos: 1 %
EOS (ABSOLUTE): 0.3 x10E3/uL (ref 0.0–0.4)
Eos: 3 %
Hematocrit: 46.8 % (ref 37.5–51.0)
Hemoglobin: 15.7 g/dL (ref 13.0–17.7)
Immature Grans (Abs): 0 x10E3/uL (ref 0.0–0.1)
Immature Granulocytes: 0 %
Lymphocytes Absolute: 3.6 x10E3/uL — ABNORMAL HIGH (ref 0.7–3.1)
Lymphs: 34 %
MCH: 30 pg (ref 26.6–33.0)
MCHC: 33.5 g/dL (ref 31.5–35.7)
MCV: 89 fL (ref 79–97)
Monocytes Absolute: 0.8 x10E3/uL (ref 0.1–0.9)
Monocytes: 8 %
Neutrophils Absolute: 5.8 x10E3/uL (ref 1.4–7.0)
Neutrophils: 54 %
Platelets: 304 x10E3/uL (ref 150–450)
RBC: 5.24 x10E6/uL (ref 4.14–5.80)
RDW: 12.3 % (ref 11.6–15.4)
WBC: 10.6 x10E3/uL (ref 3.4–10.8)

## 2024-05-24 LAB — TSH: TSH: 0.65 u[IU]/mL (ref 0.450–4.500)

## 2024-05-25 ENCOUNTER — Telehealth (HOSPITAL_COMMUNITY): Payer: Self-pay | Admitting: Family

## 2024-05-25 ENCOUNTER — Other Ambulatory Visit: Payer: Self-pay | Admitting: Family Medicine

## 2024-05-25 DIAGNOSIS — K219 Gastro-esophageal reflux disease without esophagitis: Secondary | ICD-10-CM

## 2024-05-25 NOTE — Telephone Encounter (Signed)
 Attempted to contact patient as requested by patient on 10/28. No answer and unable to leave a voicemail

## 2024-06-22 ENCOUNTER — Other Ambulatory Visit: Payer: Self-pay | Admitting: Family Medicine

## 2024-06-22 DIAGNOSIS — K219 Gastro-esophageal reflux disease without esophagitis: Secondary | ICD-10-CM

## 2024-06-28 ENCOUNTER — Ambulatory Visit
Admission: RE | Admit: 2024-06-28 | Discharge: 2024-06-28 | Disposition: A | Discharge status: Home / Self Care | Attending: Nurse Practitioner

## 2024-06-28 VITALS — BP 136/84 | HR 57 | Temp 98.4°F | Resp 18

## 2024-06-28 DIAGNOSIS — S39012A Strain of muscle, fascia and tendon of lower back, initial encounter: Secondary | ICD-10-CM | POA: Diagnosis not present

## 2024-06-28 MED ORDER — METHOCARBAMOL 500 MG PO TABS: 500.0000 mg | ORAL_TABLET | Freq: Two times a day (BID) | ORAL | 0 refills | Status: DC | PRN

## 2024-06-28 MED ORDER — KETOROLAC TROMETHAMINE 30 MG/ML IJ SOLN
30.0000 mg | Freq: Once | INTRAMUSCULAR | Status: AC
  Administered 2024-06-28: 30 mg via INTRAMUSCULAR

## 2024-06-28 MED ORDER — IBUPROFEN 800 MG PO TABS: 800.0000 mg | ORAL_TABLET | Freq: Three times a day (TID) | ORAL | 0 refills | Status: DC | PRN

## 2024-06-28 NOTE — ED Triage Notes (Signed)
 Pt reports lower back pain states he injured the back last night, pain when laying to sitting. Pain in the upper back as well.

## 2024-06-28 NOTE — ED Provider Notes (Signed)
 [facility]    CSN: [id number] Arrival date & time: 06/28/24  1306      History   Chief Complaint Chief Complaint  Patient presents with   Back Pain    Very uncomfortable pain - Entered by patient    HPI Jose Mitchell is a 43 y.o. male.   Patient presents today with 1 day history of worsening right low back pain.  Pain is worse with certain movements only.  No shooting pain down either leg.  Reports approximately 1 year ago, he had surgery of the lumbar spine and for the past few months, has had worsening right low back pain.  He denies any recent fall, trauma, or known injury to the back.  No new numbness or tingling to down the legs, saddle anesthesia, tingling in toes, weakness of lower extremities, or decreased sensation of lower extremities.  No new urinary symptoms.  Has taken ibuprofen  for the pain which helps temporarily.  Patient reports he lifts heavy objects at work.    Past Medical History:  Diagnosis Date   Alcohol abuse    Anxiety reaction 12/18/2021   Bipolar 1 disorder (HCC)    Bipolar 1 disorder, depressed (HCC) 04/08/2020   Bipolar 2 disorder, major depressive episode (HCC) 04/08/2020   Bipolar disorder, current episode depressed, severe, with psychotic features (HCC) 10/24/2023   Chronic right shoulder pain 11/24/2021   Cocaine abuse (HCC)    Epigastric pain 01/27/2022   Gastroesophageal reflux disease 10/04/2022   GERD without esophagitis 12/01/2022   Hemorrhoids 12/01/2022   Hiatal hernia 12/01/2022   HNP (herniated nucleus pulposus), lumbar 10/04/2022   Hx of adenomatous colonic polyps 12/01/2022   Low back pain radiating down leg 02/29/2020   Lumbar stenosis with neurogenic claudication 12/08/2022   Other chronic pain 10/15/2023   Pre-operative clearance 12/03/2022   Restless leg 04/08/2020   Schizoaffective disorder, depressive type (HCC) 01/28/2023   Schizophrenia (HCC)    Schizophrenia, paranoid type (HCC) 06/06/2020   I was  diagnosed just don't remember the exact date but it seems it gets worse lately     Sinusitis 10/28/2022    Patient Active Problem List   Diagnosis Date Noted   Encounter for general adult medical examination with abnormal findings 05/22/2024   Bipolar disorder, in partial remission, most recent episode mixed (HCC) 05/22/2024   Prediabetes 05/22/2024   Mixed hyperlipidemia 05/22/2024   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 No pertinent surgical history.     Home Medications    Prior to Admission medications   Medication Sig Start Date End Date Taking? Authorizing Provider  ibuprofen  (ADVIL ) 800 MG tablet Take 1 tablet (800 mg total) by mouth every 8 (eight) hours as needed. Take with food to prevent GI upset 06/28/24  Yes Chandra Harlene LABOR, NP  methocarbamol  (ROBAXIN ) 500 MG tablet Take 1 tablet (500 mg total) by mouth 2 (two) times daily as needed for muscle spasms. Do not take with alcohol or while driving or operating heavy machinery.  May cause drowsiness. 06/28/24  Yes Chandra Harlene LABOR, NP  pantoprazole  (PROTONIX ) 40 MG tablet Take 1 tablet by mouth once daily 06/25/24   Bacchus, Gloria Z, FNP    Family History Family History  Family history unknown: Yes    Social History Social History   Tobacco Use   Smoking status: Former  Current packs/day: 0.00    Types: Cigarettes    Quit date: 2021    Years since quitting: 4.9   Smokeless tobacco: Current    Types: Chew  Vaping Use   Vaping status: Never Used  Substance Use Topics   Alcohol use: Not Currently   Drug use: Not Currently    Types: Cocaine     Allergies   Sulfamethoxazole, Trimethoprim, and Sulfamethoxazole-trimethoprim   Review of Systems Review of Systems Per HPI  Physical Exam Triage Vital Signs ED Triage Vitals   Encounter Vitals Group     BP 06/28/24 1322 136/84     Girls Systolic BP Percentile --      Girls Diastolic BP Percentile --      Boys Systolic BP Percentile --      Boys Diastolic BP Percentile --      Pulse Rate 06/28/24 1322 (!) 57     Resp 06/28/24 1322 18     Temp 06/28/24 1322 98.4 F (36.9 C)     Temp Source 06/28/24 1322 Oral     SpO2 06/28/24 1322 95 %     Weight --      Height --      Head Circumference --      Peak Flow --      Pain Score 06/28/24 1325 4     Pain Loc --      Pain Education --      Exclude from Growth Chart --    No data found.  Updated Vital Signs BP 136/84 (BP Location: Right Arm)   Pulse (!) 57   Temp 98.4 F (36.9 C) (Oral)   Resp 18   SpO2 95%   Visual Acuity Right Eye Distance:   Left Eye Distance:   Bilateral Distance:    Right Eye Near:   Left Eye Near:    Bilateral Near:     Physical Exam Vitals and nursing note reviewed.  Constitutional:      General: He is not in acute distress.    Appearance: Normal appearance. He is not toxic-appearing.  Pulmonary:     Effort: Pulmonary effort is normal. No respiratory distress.  Abdominal:     Tenderness: There is no right CVA tenderness or left CVA tenderness.  Musculoskeletal:       Arms:     Comments: Inspection: no swelling, bruising, obvious deformity or redness to lumbar spine Palpation: Lumbar spine and paraspinal muscles diffusely nontender to palpation; no obvious deformities palpated ROM: Full ROM to lumbar spine, bilateral lower extremities Strength: 5/5 bilateral lower extremities Neurovascular: neurovascularly intact in distal bilateral lower extremities    Skin:    General: Skin is warm and dry.     Capillary Refill: Capillary refill takes less than 2 seconds.     Coloration: Skin is not jaundiced or pale.     Findings: No erythema.  Neurological:     Mental Status: He is alert and oriented to person, place, and time.  Psychiatric:        Behavior: Behavior is  cooperative.      UC Treatments / Results  Labs (all labs ordered are listed, but only abnormal results are displayed) Labs Reviewed - No data to display  EKG   Radiology No results found.  Procedures Procedures (including critical care time)  Medications Ordered in UC Medications  ketorolac  (TORADOL ) 30 MG/ML injection 30 mg (30 mg Intramuscular Given 06/28/24 1405)    Initial Impression / Assessment and Plan /  UC Course  I have reviewed the triage vital signs and the nursing notes.  Pertinent labs & imaging results that were available during my care of the patient were reviewed by me and considered in my medical decision making (see chart for details).   Patient is a pleasant, well-appearing 43 year old male presenting today for acute on chronic right low back pain.  Without any recent trauma or injury, x-ray imaging deferred.  On exam, there appears to be tightness of the paraspinal muscles on the right side.  Toradol  30 mg IM was given in urgent care today for pain and inflammation, recommended avoidance of NSAIDs for 48 hours.  Thereafter, consider ibuprofen  800 mg every 8 hours as needed for pain.  Also recommended Robaxin  twice daily as needed for muscular pain.  Also recommended light range of motion and stretching exercises.  Follow up with Spine Specialist if symptoms persist/worsen despite treatment.  Work excuse provided.  The patient was given the opportunity to ask questions.  All questions answered to their satisfaction.  The patient is in agreement to this plan.   Final Clinical Impressions(s) / UC Diagnoses   Final diagnoses:  Strain of lumbar region, initial encounter     Discharge Instructions      You have strained the muscles in your back.  We gave you an injection of an anti-inflammatory medicine today to help with pain and inflammation in your back.  Avoid NSAIDs for 48 hours.  Thereafter, you can take the Advil  800 mg every 8 hours as needed.   Additionally, you can start the Robaxin  up to twice daily to help with muscular pain.  Recommend light range of motion and stretching exercises, ice/heat, and Tylenol  as needed for pain.  Seek care if symptoms persist/worsen despite treatment with your spine and scoliosis specialist.   ED Prescriptions     Medication Sig Dispense Auth. Provider   ibuprofen  (ADVIL ) 800 MG tablet Take 1 tablet (800 mg total) by mouth every 8 (eight) hours as needed. Take with food to prevent GI upset 30 tablet Chandra Raisin A, NP   methocarbamol  (ROBAXIN ) 500 MG tablet Take 1 tablet (500 mg total) by mouth 2 (two) times daily as needed for muscle spasms. Do not take with alcohol or while driving or operating heavy machinery.  May cause drowsiness. 20 tablet Chandra Raisin LABOR, NP      PDMP not reviewed this encounter.   Chandra Raisin LABOR, NP 06/28/24 [phone]

## 2024-06-28 NOTE — Discharge Instructions (Addendum)
 You have strained the muscles in your back.  We gave you an injection of an anti-inflammatory medicine today to help with pain and inflammation in your back.  Avoid NSAIDs for 48 hours.  Thereafter, you can take the Advil  800 mg every 8 hours as needed.  Additionally, you can start the Robaxin  up to twice daily to help with muscular pain.  Recommend light range of motion and stretching exercises, ice/heat, and Tylenol  as needed for pain.  Seek care if symptoms persist/worsen despite treatment with your spine and scoliosis specialist.

## 2024-06-30 ENCOUNTER — Encounter: Payer: Self-pay | Admitting: Family Medicine

## 2024-07-02 ENCOUNTER — Other Ambulatory Visit: Payer: Self-pay | Admitting: Family Medicine

## 2024-07-02 NOTE — Telephone Encounter (Signed)
 Kindly inquire about the symptoms he is experiencing and their duration. Ask whether the symptoms are worse at bedtime, during the day, or if they are constant. Also ask what, if anything, makes the symptoms better.

## 2024-07-03 ENCOUNTER — Other Ambulatory Visit (HOSPITAL_COMMUNITY): Payer: Self-pay

## 2024-07-03 ENCOUNTER — Encounter (HOSPITAL_COMMUNITY): Payer: Self-pay | Admitting: Family

## 2024-07-03 ENCOUNTER — Ambulatory Visit (HOSPITAL_COMMUNITY): Admitting: Family

## 2024-07-03 ENCOUNTER — Other Ambulatory Visit: Payer: Self-pay

## 2024-07-03 DIAGNOSIS — F22 Delusional disorders: Secondary | ICD-10-CM | POA: Diagnosis not present

## 2024-07-03 DIAGNOSIS — M25569 Pain in unspecified knee: Secondary | ICD-10-CM | POA: Diagnosis not present

## 2024-07-03 DIAGNOSIS — F3181 Bipolar II disorder: Secondary | ICD-10-CM | POA: Diagnosis not present

## 2024-07-03 MED ORDER — QUETIAPINE FUMARATE 50 MG PO TABS: 50.0000 mg | ORAL_TABLET | Freq: Two times a day (BID) | ORAL | 0 refills | Status: DC

## 2024-07-03 MED ORDER — QUETIAPINE FUMARATE 25 MG PO TABS: ORAL_TABLET | ORAL | 0 refills | Status: DC

## 2024-07-03 NOTE — Progress Notes (Unsigned)
 [facility] MD/PA/NP OP Progress Note  07/03/2024 1:10 PM Jose Mitchell  MRN:  [id number]  Chief Complaint:  Jose Mitchell reported  Jose am having crazy thinking and intrusive thoughts.  HPI: Jose Mitchell 43 year old Caucasian male who presents for medication management.  He was seen and evaluated face-to-face by this provider.  On previous assessment patient reports feeling better and has not been on medications for the past 3 to 4 months.  He states within the past month he has been experiencing intrusive thoughts, increased paranoia and auditory hallucinations.  Patient self discontinued medications   people are out to get me.  Thopughtfs started wtith in the past month.    Visit Diagnosis: No diagnosis found.  Past Psychiatric History:   Past Medical History:  Past Medical History:  Diagnosis Date   Alcohol abuse    Anxiety reaction 12/18/2021   Bipolar 1 disorder (HCC)    Bipolar 1 disorder, depressed (HCC) 04/08/2020   Bipolar 2 disorder, major depressive episode (HCC) 04/08/2020   Bipolar disorder, current episode depressed, severe, with psychotic features (HCC) 10/24/2023   Chronic right shoulder pain 11/24/2021   Cocaine abuse (HCC)    Epigastric pain 01/27/2022   Gastroesophageal reflux disease 10/04/2022   GERD without esophagitis 12/01/2022   Hemorrhoids 12/01/2022   Hiatal hernia 12/01/2022   HNP (herniated nucleus pulposus), lumbar 10/04/2022   Hx of adenomatous colonic polyps 12/01/2022   Low back pain radiating down leg 02/29/2020   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  Family  Psychiatric History:   Family History:  Family History  Family history unknown: Yes    Social History:  Social History   Socioeconomic History   Marital status: Single    Spouse name: Not on file   Number of children: Not on file   Years of education: Not on file   Highest education level: GED or equivalent  Occupational History   Not on file  Tobacco Use   Smoking status: Former    Current packs/day: 0.00    Types: Cigarettes    Quit date: 2021    Years since quitting: 4.9   Smokeless tobacco: Current    Types: Chew  Vaping Use   Vaping status: Never Used  Substance and Sexual Activity   Alcohol use: Not Currently   Drug use: Not Currently    Types: Cocaine   Sexual activity: Not Currently  Other Topics Concern   Not on file  Social History Narrative   Not on file   Social Drivers of Health   Financial Resource Strain: Low Risk  (05/11/2024)   Overall Financial Resource Strain (CARDIA)    Difficulty of Paying Living Expenses: Not very hard  Food Insecurity: No Food Insecurity (05/11/2024)   Hunger Vital Sign    Worried About Running Out of Food in the Last Year: Never true    Ran Out of Food in the Last Year: Never true  Transportation Needs: No Transportation Needs (05/11/2024)   PRAPARE - Administrator, Civil Service (Medical): No    Lack of Transportation (Non-Medical): No  Physical Activity: Insufficiently  Active (05/11/2024)   Exercise Vital Sign    Days of Exercise per Week: 3 days    Minutes of Exercise per Session: 30 min  Stress: No Stress Concern Present (05/11/2024)   [employer] of Occupational Health - Occupational Stress Questionnaire    Feeling of Stress: Only a little  Social Connections: Moderately Isolated (05/11/2024)   Social Connection and Isolation Panel    Frequency of Communication with Friends and Family: More than three times a week    Frequency of Social Gatherings with Friends and Family: Once a week     Attends Religious Services: More than 4 times per year    Active Member of [employer] or Organizations: No    Attends Engineer, Structural: Not on file    Marital Status: Divorced    Allergies:  Allergies  Allergen Reactions   Sulfamethoxazole    Trimethoprim Nausea And Vomiting   Sulfamethoxazole-Trimethoprim Rash and Other (See Comments)    Childhood allergy. Does not know reaction    Metabolic Disorder Labs: Lab Results  Component Value Date   HGBA1C 5.9 (H) 05/23/2024   No results found for: PROLACTIN Lab Results  Component Value Date   CHOL 144 05/23/2024   TRIG 97 05/23/2024   HDL 45 05/23/2024   CHOLHDL 3.2 05/23/2024   LDLCALC 81 05/23/2024   LDLCALC 106 (H) 10/14/2023   Lab Results  Component Value Date   TSH 0.650 05/23/2024   TSH 0.522 10/14/2023    Therapeutic Level Labs: No results found for: LITHIUM No results found for: VALPROATE No results found for: CBMZ  Current Medications: Current Outpatient Medications  Medication Sig Dispense Refill   ibuprofen  (ADVIL ) 800 MG tablet Take 1 tablet (800 mg total) by mouth every 8 (eight) hours as needed. Take with food to prevent GI upset 30 tablet 0   methocarbamol  (ROBAXIN ) 500 MG tablet Take 1 tablet (500 mg total) by mouth 2 (two) times daily as needed for muscle spasms. Do not take with alcohol or while driving or operating heavy machinery.  May cause drowsiness. 20 tablet 0   pantoprazole  (PROTONIX ) 40 MG tablet Take 1 tablet by mouth once daily 30 tablet 0   No current facility-administered medications for this visit.     Musculoskeletal: Strength & Muscle Tone: within normal limits Gait & Station: normal Patient leans: N/A  Psychiatric Specialty Exam: Review of Systems  Blood pressure (!) 142/88, pulse 68, height 6' 2 (1.88 m), weight 217 lb (98.4 kg).Body mass index is 27.86 kg/m.  General Appearance: Casual  Eye Contact:  Good  Speech:  Clear and Coherent  Volume:  Normal   Mood:  Anxious and Depressed  Affect:  Congruent  Thought Process:  Coherent  Orientation:  Full (Time, Place, and Person)  Thought Content: Logical   Suicidal Thoughts:  No  Homicidal Thoughts:  No  Memory:  Immediate;   Fair Recent;   Fair  Judgement:  Good  Insight:  Good  Psychomotor Activity:  Normal  Concentration:  Concentration: Good  Recall:  Good  Fund of Knowledge: Good  Language: Fair  Akathisia:  No  Handed:  Right  AIMS (if indicated): not done  Assets:  Communication Skills Desire for Improvement  ADL's:  Intact  Cognition: WNL  Sleep:  Good   Screenings: AUDIT    Flowsheet Row [facility]  Alcohol Use Disorder Identification Test Final Score (AUDIT) 0   GAD-7  Flowsheet Row Office Visit from 05/22/2024 in [facility] Office Visit from 10/14/2023 in [facility] Office Visit from 11/09/2022 in [facility] Office Visit from 10/28/2022 in [facility] Office Visit from 10/15/2022 in [facility]  Total GAD-7 Score 0 16 10 9 8    PHQ2-9    Flowsheet Row Office Visit from 05/22/2024 in [facility] Office Visit from 10/14/2023 in [facility] Office Visit from 11/09/2022 in [facility] Office Visit from 10/28/2022 in [facility] Office Visit from 10/15/2022 in [facility]  PHQ-2 Total Score 0 5 5 1 4   PHQ-9 Total Score 0 15 18 11 13    Flowsheet Row UC from 06/28/2024 in [facility] [place] Most recent reading at 06/28/2024  1:26 PM UC from 11/29/2023 in [facility] [place] Most recent reading at 11/29/2023  3:43 PM ED from 11/29/2023 in [facility] Most recent reading at 11/29/2023  2:51 PM  C-SSRS RISK  CATEGORY No Risk No Risk No Risk     Assessment and Plan:  Jose Mitchell   Collaboration of Care: Collaboration of Care: Medication Management AEB ***  Patient/Guardian was advised Release of Information must be obtained prior to any record release in order to collaborate their care with an outside provider. Patient/Guardian was advised if they have not already done so to contact the registration department to sign all necessary forms in order for us  to release information regarding their care.   Consent: Patient/Guardian gives verbal consent for treatment and assignment of benefits for services provided during this visit. Patient/Guardian expressed understanding and agreed to proceed.    Staci LOISE Kerns, NP 07/03/2024, 1:10 PM

## 2024-07-04 MED ORDER — GABAPENTIN 100 MG PO CAPS: ORAL_CAPSULE | ORAL | 3 refills | Status: DC

## 2024-07-04 NOTE — Telephone Encounter (Signed)
 Please inform the patient that a prescription for gabapentin  100 mg, to be taken 2 hours before bedtime, has been sent to his pharmacy to help manage his Restless Leg Syndrome.

## 2024-07-05 ENCOUNTER — Telehealth (HOSPITAL_COMMUNITY): Payer: Self-pay

## 2024-07-05 NOTE — Telephone Encounter (Signed)
 Patient called this morning, he took the Seroquel  and it is definitely helping him sleep, but the daytime dose is too strong. Patient said he felt woozy and almost fell asleep at work. Please review and advise, thank you

## 2024-07-06 ENCOUNTER — Telehealth (HOSPITAL_COMMUNITY): Payer: Self-pay | Admitting: Family

## 2024-07-06 NOTE — Telephone Encounter (Signed)
 Spoke to patient related to Seroquel  benign to sedating. Reported he is taken Seroquel  25 mg at night and will try to increase over the next week to  Seroquel  50 mg. Stated he was restarted on gabapentin  by is PCP for restless legs. Patient to folllow-up 4 weeks

## 2024-07-17 ENCOUNTER — Other Ambulatory Visit: Payer: Self-pay

## 2024-07-17 ENCOUNTER — Telehealth (HOSPITAL_BASED_OUTPATIENT_CLINIC_OR_DEPARTMENT_OTHER): Admitting: Family

## 2024-07-17 ENCOUNTER — Ambulatory Visit
Admission: RE | Admit: 2024-07-17 | Discharge: 2024-07-17 | Disposition: A | Discharge status: Home / Self Care | Source: Ambulatory Visit | Attending: Family Medicine | Admitting: Family Medicine

## 2024-07-17 VITALS — BP 123/78 | HR 74 | Temp 98.1°F | Resp 20

## 2024-07-17 DIAGNOSIS — F411 Generalized anxiety disorder: Secondary | ICD-10-CM | POA: Diagnosis not present

## 2024-07-17 DIAGNOSIS — F3181 Bipolar II disorder: Secondary | ICD-10-CM | POA: Diagnosis not present

## 2024-07-17 DIAGNOSIS — F22 Delusional disorders: Secondary | ICD-10-CM | POA: Diagnosis not present

## 2024-07-17 DIAGNOSIS — M79604 Pain in right leg: Secondary | ICD-10-CM | POA: Diagnosis not present

## 2024-07-17 MED ORDER — ARIPIPRAZOLE 5 MG PO TABS: 5.0000 mg | ORAL_TABLET | Freq: Every day | ORAL | 2 refills | Status: DC

## 2024-07-17 MED ORDER — PREDNISONE 20 MG PO TABS: 40.0000 mg | ORAL_TABLET | Freq: Every day | ORAL | 0 refills | Status: DC

## 2024-07-17 NOTE — Discharge Instructions (Signed)
 I have sent over some prednisone  to help with pain and inflammation in your leg.  You may continue with heat, massage, stretches, muscle relaxers as needed.  Follow-up for the ultrasound to rule out a DVT tomorrow morning as scheduled and we will let you know if this comes back abnormal.

## 2024-07-17 NOTE — ED Notes (Addendum)
 Called centralized scheduling for stat DVT rule out. Centralized scheduling reported pt needs to present to [facility]ology at 1015am on 07/17/24, appointment is for 1030. Pt and provider aware.

## 2024-07-17 NOTE — ED Triage Notes (Signed)
 Pt reports right thigh pain that radiates to right knee and foot x3 weeks. Denies any known injury and reports increase in pain at time while driving. Has tried otc ibuprofen .

## 2024-07-17 NOTE — ED Provider Notes (Signed)
 " [facility]    CSN: [id number] Arrival date & time: 07/17/24  1416      History   Chief Complaint Chief Complaint  Patient presents with   Leg Pain    Leg pain behind the thigh down behind knee - Entered by patient    HPI Jose Mitchell is a 43 y.o. male.   Patient presenting today with about 3-week history of localized right posterior upper leg dull pain that sometimes aches down into the posterior lower leg.  Denies redness, swelling, injury to the area, numbness, tingling, weakness.  Does have a long history of chronic back issues particularly affecting the right side but notes this feels very different than sciatica issues that he has had in the past.  Denies chest pain, shortness of breath, palpitations, bowel or bladder incontinence, saddle anesthesias.  Trying over-the-counter anti-inflammatories and muscle relaxers with no relief.    Past Medical History:  Diagnosis Date   Alcohol abuse    Anxiety reaction 12/18/2021   Bipolar 1 disorder (HCC)    Bipolar 1 disorder, depressed (HCC) 04/08/2020   Bipolar 2 disorder, major depressive episode (HCC) 04/08/2020   Bipolar disorder, current episode depressed, severe, with psychotic features (HCC) 10/24/2023   Chronic right shoulder pain 11/24/2021   Cocaine abuse (HCC)    Epigastric pain 01/27/2022   Gastroesophageal reflux disease 10/04/2022   GERD without esophagitis 12/01/2022   Hemorrhoids 12/01/2022   Hiatal hernia 12/01/2022   HNP (herniated nucleus pulposus), lumbar 10/04/2022   Hx of adenomatous colonic polyps 12/01/2022   Low back pain radiating down leg 02/29/2020   Lumbar stenosis with neurogenic claudication 12/08/2022   Other chronic pain 10/15/2023   Pre-operative clearance 12/03/2022   Restless leg 04/08/2020   Schizoaffective disorder, depressive type (HCC) 01/28/2023   Schizophrenia (HCC)    Schizophrenia, paranoid type (HCC) 06/06/2020   I was diagnosed just dont remember the exact date  but it seems it gets worse lately     Sinusitis 10/28/2022    Patient Active Problem List   Diagnosis Date Noted   Encounter for general adult medical examination with abnormal findings 05/22/2024   Bipolar disorder, in partial remission, most recent episode mixed (HCC) 05/22/2024   Prediabetes 05/22/2024   Mixed hyperlipidemia 05/22/2024   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 No pertinent surgical history.     Home Medications    Prior to Admission medications  Medication Sig Start Date End Date Taking? Authorizing Provider  predniSONE  (DELTASONE ) 20 MG tablet Take 2 tablets (40 mg total) by mouth daily with breakfast. 07/17/24  Yes Stuart Vernell Norris, PA-C  ARIPiprazole  (ABILIFY ) 5 MG tablet Take 1 tablet (5 mg total) by mouth daily. 07/17/24 07/17/25  Ezzard Staci SAILOR, NP  gabapentin  (NEURONTIN ) 100 MG capsule Take 100 mg once daily 2 hours before bedtime 07/04/24   Bacchus, Gloria Z, FNP  ibuprofen  (ADVIL ) 800 MG tablet Take 1 tablet (800 mg total) by mouth every 8 (eight) hours as needed. Take with food to prevent GI upset 06/28/24   Chandra Harlene LABOR, NP  methocarbamol  (ROBAXIN ) 500 MG tablet Take 1 tablet (500 mg total) by mouth 2 (two) times daily as needed for muscle spasms. Do not take with alcohol or while driving or operating heavy machinery.  May cause drowsiness. 06/28/24   Chandra Harlene LABOR, NP  pantoprazole  (PROTONIX ) 40  MG tablet Take 1 tablet by mouth once daily 06/25/24   Bacchus, Meade PEDLAR, FNP    Family History Family History  Family history unknown: Yes    Social History Social History[1]   Allergies   Sulfamethoxazole, Trimethoprim, and Sulfamethoxazole-trimethoprim   Review of Systems Review of Systems PER HPI  Physical Exam Triage Vital Signs ED Triage Vitals   Encounter Vitals Group     BP 07/17/24 1435 123/78     Girls Systolic BP Percentile --      Girls Diastolic BP Percentile --      Boys Systolic BP Percentile --      Boys Diastolic BP Percentile --      Pulse Rate 07/17/24 1435 74     Resp 07/17/24 1435 20     Temp 07/17/24 1435 98.1 F (36.7 C)     Temp Source 07/17/24 1435 Oral     SpO2 07/17/24 1435 94 %     Weight --      Height --      Head Circumference --      Peak Flow --      Pain Score 07/17/24 1433 4     Pain Loc --      Pain Education --      Exclude from Growth Chart --    No data found.  Updated Vital Signs BP 123/78 (BP Location: Right Arm)   Pulse 74   Temp 98.1 F (36.7 C) (Oral)   Resp 20   SpO2 94%   Visual Acuity Right Eye Distance:   Left Eye Distance:   Bilateral Distance:    Right Eye Near:   Left Eye Near:    Bilateral Near:     Physical Exam Vitals and nursing note reviewed.  Constitutional:      Appearance: Normal appearance.  HENT:     Head: Atraumatic.  Eyes:     Extraocular Movements: Extraocular movements intact.     Conjunctiva/sclera: Conjunctivae normal.  Cardiovascular:     Rate and Rhythm: Normal rate.  Pulmonary:     Effort: Pulmonary effort is normal.  Musculoskeletal:        General: Tenderness present. No swelling, deformity or signs of injury. Normal range of motion.     Cervical back: Normal range of motion and neck supple.     Comments: Localized area of tenderness to palpation to the mid right posterior upper leg, no obvious palpable masses or cords.  Nonedematous, no discoloration, range of motion of the right lower extremity intact  Skin:    General: Skin is warm and dry.     Findings: No bruising or erythema.  Neurological:     Mental Status: He is oriented to person, place, and time.     Motor: No weakness.     Gait: Gait normal.     Comments: Right lower extremity neurovascularly intact  Psychiatric:        Mood and Affect: Mood normal.        Thought  Content: Thought content normal.        Judgment: Judgment normal.      UC Treatments / Results  Labs (all labs ordered are listed, but only abnormal results are displayed) Labs Reviewed - No data to display  EKG   Radiology No results found.  Procedures Procedures (including critical care time)  Medications Ordered in UC Medications - No data to display  Initial Impression / Assessment and Plan / UC Course  I  have reviewed the triage vital signs and the nursing notes.  Pertinent labs & imaging results that were available during my care of the patient were reviewed by me and considered in my medical decision making (see chart for details).     Vital signs and exam overall reassuring today, not fully consistent with sciatica and does not seem to be associated with his chronic back pain per patient so we will rule out more emergent cause such as DVT with vascular ultrasound.  He is scheduled for tomorrow morning for vascular ultrasound for rule out.  If negative, prednisone , heat, massage, stretches, rest, muscle relaxers which he states he has at home.  Ortho follow-up if not resolving.  Final Clinical Impressions(s) / UC Diagnoses   Final diagnoses:  Right leg pain     Discharge Instructions      I have sent over some prednisone  to help with pain and inflammation in your leg.  You may continue with heat, massage, stretches, muscle relaxers as needed.  Follow-up for the ultrasound to rule out a DVT tomorrow morning as scheduled and we will let you know if this comes back abnormal.    ED Prescriptions     Medication Sig Dispense Auth. Provider   predniSONE  (DELTASONE ) 20 MG tablet Take 2 tablets (40 mg total) by mouth daily with breakfast. 10 tablet Stuart Vernell Norris, [place]      PDMP not reviewed this encounter.    [1]  Social History Tobacco Use   Smoking status: Former    Current packs/day: 0.00    Average packs/day: 1.0 packs/day    Types: Cigarettes     Quit date: 2021    Years since quitting: 4.9   Smokeless tobacco: Current    Types: Chew  Vaping Use   Vaping status: Never Used  Substance Use Topics   Alcohol use: Not Currently   Drug use: Not Currently    Types: Cocaine     Stuart Vernell Norris, PA-C 07/17/24 1637  "

## 2024-07-17 NOTE — Progress Notes (Signed)
 Virtual Visit via Video Note  I connected with Jose Mitchell on 07/17/2024 at  7:30 AM EST by a video enabled telemedicine application and verified that I am speaking with the correct person using two identifiers.  Location: Patient: Work Provider: Economist   I discussed the limitations of evaluation and management by telemedicine and the availability of in person appointments. The patient expressed understanding and agreed to proceed.   I discussed the assessment and treatment plan with the patient. The patient was provided an opportunity to ask questions and all were answered. The patient agreed with the plan and demonstrated an understanding of the instructions.   The patient was advised to call back or seek an in-person evaluation if the symptoms worsen or if the condition fails to improve as anticipated.  I provided 15 minutes of non-face-to-face time during this encounter.   Jose LOISE Kerns, NP    [facility] MD/PA/NP OP Progress Note  07/17/2024 9:28 AM Jose Mitchell  MRN:  [id number]  Chief Complaint: Medication Management    HPI: Jose Mitchell is a 43 year old male who presents for medication management follow-up appointment.  Seen and evaluated via virtual platform.  significant psychiatric history with schizophrenia, bipolar disorder, major depressive disorder, posttraumatic stress disorder and substance-induced mood disorder.  Jose Mitchell denied that he has been taking any psychotropic medication over 4 to 5 months.  Jose Mitchell recently represented 3 weeks prior due to mental health deterioration.  Stated his symptoms include  social isolation, increased depression and paranoia.   On office visit 12/12 discussed restarting Seroquel  25 mg twice daily and 50 mg nightly.  He reports he has not been able to tolerate medication and has not been taking medication as directed.  States he is feeling somewhat better.  Continues to have ongoing ruminations and now paranoia thoughts/distractions.   Discussed restarting Abilify  5 mg daily.  He appeared amendable to plan.  Will further discuss long-acting injectable for Abilify  maintainer at follow-up appointment.  Chart reviewed patient continues to vacillate between taking psychotropic medications and mood stabilization. Reports increased appetite as he states he has been eating throughout the night.  Will continue to monitor symptoms.  Support, encouragement and reassurance was provided.  Visit Diagnosis:    ICD-10-CM   1. Bipolar 2 disorder, major depressive episode (HCC)  F31.81     2. Paranoia (HCC)  F22     3. GAD (generalized anxiety disorder)  F41.1       Past Psychiatric History: reported unable to tolerate Seroquel .  Stated medication was to sedating. Past medication trials with Abilify , Zyprexa , Rexulti, Depakote and  Lyrica .  Discussed which medication has been beneficial in the past.  States  Klonopin is the only thing that has helped my anxiety.   Past Medical History:  Past Medical History:  Diagnosis Date   Alcohol abuse    Anxiety reaction 12/18/2021   Bipolar 1 disorder (HCC)    Bipolar 1 disorder, depressed (HCC) 04/08/2020   Bipolar 2 disorder, major depressive episode (HCC) 04/08/2020   Bipolar disorder, current episode depressed, severe, with psychotic features (HCC) 10/24/2023   Chronic right shoulder pain 11/24/2021   Cocaine abuse (HCC)    Epigastric pain 01/27/2022   Gastroesophageal reflux disease 10/04/2022   GERD without esophagitis 12/01/2022   Hemorrhoids 12/01/2022   Hiatal hernia 12/01/2022   HNP (herniated nucleus pulposus), lumbar 10/04/2022   Hx of adenomatous colonic polyps 12/01/2022   Low back pain radiating down leg 02/29/2020   Lumbar stenosis with neurogenic  claudication 12/08/2022   Other chronic pain 10/15/2023   Pre-operative clearance 12/03/2022   Restless leg 04/08/2020   Schizoaffective disorder, depressive type (HCC) 01/28/2023   Schizophrenia (HCC)    Schizophrenia,  paranoid type (HCC) 06/06/2020   I was diagnosed just dont remember the exact date but it seems it gets worse lately     Sinusitis 10/28/2022   No past surgical history on file.  Family Psychiatric History:   Family History:  Family History  Family history unknown: Yes    Social History:  Social History   Socioeconomic History   Marital status: Single    Spouse name: Not on file   Number of children: Not on file   Years of education: Not on file   Highest education level: GED or equivalent  Occupational History   Not on file  Tobacco Use   Smoking status: Former    Current packs/day: 0.00    Average packs/day: 1.0 packs/day    Types: Cigarettes    Quit date: 2021    Years since quitting: 4.9   Smokeless tobacco: Current    Types: Chew  Vaping Use   Vaping status: Never Used  Substance and Sexual Activity   Alcohol use: Not Currently   Drug use: Not Currently    Types: Cocaine   Sexual activity: Not Currently  Other Topics Concern   Not on file  Social History Narrative   Not on file   Social Drivers of Health   Tobacco Use: High Risk (07/03/2024)   Patient History    Smoking Tobacco Use: Former    Smokeless Tobacco Use: Current    Passive Exposure: Not on Actuary Strain: Low Risk (05/11/2024)   Overall Financial Resource Strain (CARDIA)    Difficulty of Paying Living Expenses: Not very hard  Food Insecurity: No Food Insecurity (05/11/2024)   Epic    Worried About Programme Researcher, Broadcasting/film/video in the Last Year: Never true    Ran Out of Food in the Last Year: Never true  Transportation Needs: No Transportation Needs (05/11/2024)   Epic    Lack of Transportation (Medical): No    Lack of Transportation (Non-Medical): No  Physical Activity: Insufficiently Active (05/11/2024)   Exercise Vital Sign    Days of Exercise per Week: 3 days    Minutes of Exercise per Session: 30 min  Stress: No Stress Concern Present (05/11/2024)   [employer] of  Occupational Health - Occupational Stress Questionnaire    Feeling of Stress: Only a little  Social Connections: Moderately Isolated (05/11/2024)   Social Connection and Isolation Panel    Frequency of Communication with Friends and Family: More than three times a week    Frequency of Social Gatherings with Friends and Family: Once a week    Attends Religious Services: More than 4 times per year    Active Member of Clubs or Organizations: No    Attends Engineer, Structural: Not on file    Marital Status: Divorced  Depression (PHQ2-9): Low Risk (05/22/2024)   Depression (PHQ2-9)    PHQ-2 Score: 0  Alcohol Screen: Low Risk (10/24/2023)   Alcohol Screen    Last Alcohol Screening Score (AUDIT): 0  Housing: Unknown (05/11/2024)   Epic    Unable to Pay for Housing in the Last Year: No    Number of Times Moved in the Last Year: Not on file    Homeless in the Last Year: No  Utilities: Not At Risk (  11/29/2023)   Received from [facility]    Threatened with loss of utilities: No  Health Literacy: Not on file    Allergies: Allergies[1]  Metabolic Disorder Labs: Lab Results  Component Value Date   HGBA1C 5.9 (H) 05/23/2024   No results found for: PROLACTIN Lab Results  Component Value Date   CHOL 144 05/23/2024   TRIG 97 05/23/2024   HDL 45 05/23/2024   CHOLHDL 3.2 05/23/2024   LDLCALC 81 05/23/2024   LDLCALC 106 (H) 10/14/2023   Lab Results  Component Value Date   TSH 0.650 05/23/2024   TSH 0.522 10/14/2023    Therapeutic Level Labs: No results found for: LITHIUM No results found for: VALPROATE No results found for: CBMZ  Current Medications: Current Outpatient Medications  Medication Sig Dispense Refill   ARIPiprazole  (ABILIFY ) 5 MG tablet Take 1 tablet (5 mg total) by mouth daily. 30 tablet 2   gabapentin  (NEURONTIN ) 100 MG capsule Take 100 mg once daily 2 hours before bedtime 30 capsule 3   ibuprofen  (ADVIL ) 800 MG tablet Take 1  tablet (800 mg total) by mouth every 8 (eight) hours as needed. Take with food to prevent GI upset 30 tablet 0   methocarbamol  (ROBAXIN ) 500 MG tablet Take 1 tablet (500 mg total) by mouth 2 (two) times daily as needed for muscle spasms. Do not take with alcohol or while driving or operating heavy machinery.  May cause drowsiness. 20 tablet 0   pantoprazole  (PROTONIX ) 40 MG tablet Take 1 tablet by mouth once daily 30 tablet 0   No current facility-administered medications for this visit.     Musculoskeletal: Virtual assessment   Psychiatric Specialty Exam: Review of Systems  There were no vitals taken for this visit.There is no height or weight on file to calculate BMI.  General Appearance: Casual  Eye Contact:  Good  Speech:  Clear and Coherent  Volume:  Normal  Mood:  Euthymic  Affect:  Congruent  Thought Process:  Coherent  Orientation:  Full (Time, Place, and Person)  Thought Content: Logical   Suicidal Thoughts:  No  Homicidal Thoughts:  No  Memory:  Immediate;   Fair Remote;   Fair  Judgement:  Good  Insight:  Good  Psychomotor Activity:  Normal  Concentration:  Concentration: Good  Recall:  Good  Fund of Knowledge: Good  Language: Fair  Akathisia:  No  Handed:  Right  AIMS (if indicated): not done  Assets:  Communication Skills Desire for Improvement  ADL's:  Intact  Cognition: WNL  Sleep:  Fair reported he eats throughout the night   Screenings: AUDIT    Flowsheet Row [facility]  Alcohol Use Disorder Identification Test Final Score (AUDIT) 0   GAD-7    Flowsheet Row Office Visit from 05/22/2024 in [facility] Office Visit from 10/14/2023 in [facility] Office Visit from 11/09/2022 in [facility] Office Visit from 10/28/2022 in [facility] Office Visit from 10/15/2022 in [facility]  Primary Care  Total GAD-7 Score 0 16 10 9 8    PHQ2-9    Flowsheet Row Office Visit from 05/22/2024 in [facility] Office Visit from 10/14/2023 in [facility] Office Visit from 11/09/2022 in [facility] Office Visit from 10/28/2022 in [facility] Care Office Visit from 10/15/2022 in  [facility]  PHQ-2 Total Score 0 5 5 1 4   PHQ-9 Total Score 0 15 18 11 13    Flowsheet Row UC from 06/28/2024 in [facility] [place] Most recent reading at 06/28/2024  1:26 PM UC from 11/29/2023 in [facility] [place] Most recent reading at 11/29/2023  3:43 PM ED from 11/29/2023 in [facility] Most recent reading at 11/29/2023  2:51 PM  C-SSRS RISK CATEGORY No Risk No Risk No Risk     Assessment and Plan: Tej Murdaugh 43 year old male presents for medication management.  Previously self discontinued all medications. represented 2 weeks prior stating increased paranoia worsening depression and mental health deterioration.  Has tried and failed multiple's psychotropic medications in the past mainly due to noncompliance.  Was Restarted on Seroquel  25 Mg twice daily and 50 mg nightly.  Gurjot reports he is unable to tolerate medications due to sedating qualities.  Discussed initiating Abilify  5 mg with consideration for long-acting injectable.  Chart review multiple start and stop with psychotropic medications.   Collaboration of Care: Collaboration of Care: Medication Management AEB initiated Abilify  will consideration for long-acting injectable  Patient/Guardian was advised Release of Information must be obtained prior to any record release in order to collaborate their care with an outside provider. Patient/Guardian was advised if they have not already done so to contact the registration department to sign all necessary forms in order for us  to release  information regarding their care.   Consent: Patient/Guardian gives verbal consent for treatment and assignment of benefits for services provided during this visit. Patient/Guardian expressed understanding and agreed to proceed.    Jose LOISE Kerns, NP 07/17/2024, 9:28 AM      [1]  Allergies Allergen Reactions   Sulfamethoxazole    Trimethoprim Nausea And Vomiting   Sulfamethoxazole-Trimethoprim Rash and Other (See Comments)    Childhood allergy. Does not know reaction

## 2024-07-18 ENCOUNTER — Other Ambulatory Visit (HOSPITAL_COMMUNITY): Payer: Self-pay | Admitting: Family Medicine

## 2024-07-18 ENCOUNTER — Ambulatory Visit (HOSPITAL_COMMUNITY)
Admission: RE | Admit: 2024-07-18 | Discharge: 2024-07-18 | Disposition: A | Discharge status: Home / Self Care | Source: Ambulatory Visit | Attending: Family Medicine | Admitting: Family Medicine

## 2024-07-18 ENCOUNTER — Ambulatory Visit (HOSPITAL_COMMUNITY)

## 2024-07-18 DIAGNOSIS — M79651 Pain in right thigh: Secondary | ICD-10-CM | POA: Diagnosis not present

## 2024-07-18 DIAGNOSIS — M79606 Pain in leg, unspecified: Secondary | ICD-10-CM | POA: Diagnosis not present

## 2024-07-20 ENCOUNTER — Ambulatory Visit: Payer: Self-pay | Admitting: Family Medicine

## 2024-07-20 ENCOUNTER — Other Ambulatory Visit: Payer: Self-pay

## 2024-07-20 ENCOUNTER — Encounter: Payer: Self-pay | Admitting: Family Medicine

## 2024-07-20 DIAGNOSIS — K219 Gastro-esophageal reflux disease without esophagitis: Secondary | ICD-10-CM

## 2024-07-20 MED ORDER — PANTOPRAZOLE SODIUM 40 MG PO TBEC: 40.0000 mg | DELAYED_RELEASE_TABLET | Freq: Every day | ORAL | 0 refills | Status: DC

## 2024-07-23 ENCOUNTER — Telehealth: Admitting: Family Medicine

## 2024-07-23 DIAGNOSIS — T300 Burn of unspecified body region, unspecified degree: Secondary | ICD-10-CM

## 2024-07-23 DIAGNOSIS — M549 Dorsalgia, unspecified: Secondary | ICD-10-CM

## 2024-07-23 NOTE — Progress Notes (Signed)
" ° °  Thank you for the details you included in the comment boxes. Those details are very helpful in determining the best course of treatment for you and help us  to provide the best care.Because Jose Mitchell, we recommend that you schedule a Virtual Urgent Care video visit in order for the provider to better assess what is going on.  The provider will be able to give you a more accurate diagnosis and treatment plan if we can more freely discuss your symptoms and with the addition of a virtual examination.   If you change your visit to a video visit, we will bill your insurance (similar to an office visit) and you will not be charged for this e-Visit. You will be able to stay at home and speak with the first available [facility] advanced practice provider. The link to do a video visit is in the drop down Menu tab of your Welcome screen in MyChart.    "

## 2024-07-24 DIAGNOSIS — L853 Xerosis cutis: Secondary | ICD-10-CM | POA: Diagnosis not present

## 2024-07-28 ENCOUNTER — Encounter: Payer: Self-pay | Admitting: Family Medicine

## 2024-07-30 DIAGNOSIS — M549 Dorsalgia, unspecified: Secondary | ICD-10-CM

## 2024-07-30 MED ORDER — IBUPROFEN 800 MG PO TABS: 800.0000 mg | ORAL_TABLET | Freq: Three times a day (TID) | ORAL | 0 refills | Status: DC | PRN

## 2024-07-30 NOTE — Telephone Encounter (Signed)
"  Rx sent  "

## 2024-07-31 ENCOUNTER — Ambulatory Visit (HOSPITAL_COMMUNITY): Admitting: Licensed Clinical Social Worker

## 2024-07-31 DIAGNOSIS — F3181 Bipolar II disorder: Secondary | ICD-10-CM

## 2024-08-01 ENCOUNTER — Encounter (HOSPITAL_COMMUNITY): Payer: Self-pay

## 2024-08-01 ENCOUNTER — Encounter (HOSPITAL_COMMUNITY): Payer: Self-pay | Admitting: Licensed Clinical Social Worker

## 2024-08-01 NOTE — Progress Notes (Signed)
 Virtual Visit via Video Note  I connected with Sarim Rothman on 07/31/24 at  4:30 PM EST by a video enabled telemedicine application and verified that I am speaking with the correct person using two identifiers.  Location: Patient: home Provider: office   I discussed the limitations of evaluation and management by telemedicine and the availability of in person appointments. The patient expressed understanding and agreed to proceed.   I discussed the assessment and treatment plan with the patient. The patient was provided an opportunity to ask questions and all were answered. The patient agreed with the plan and demonstrated an understanding of the instructions.   The patient was advised to call back or seek an in-person evaluation if the symptoms worsen or if the condition fails to improve as anticipated.  I provided 40 minutes of non-face-to-face time during this encounter.   Harlene JONELLE Rosser, LCSW   THERAPIST PROGRESS NOTE  Session Time: 4:30pm-5:10pm  Participation Level: Active  Behavioral Response: NeatAlertEuthymic  Type of Therapy: Individual Therapy  Treatment Goals addressed:   paranoia/bipolar disorder Problems     paranoia/bipolar disorder Problems (Active)     Thought Disorders- paranoia     LTG: Amario will increase coping skills to promote long-term recovery and improve ability to perform daily activities (Progressing)     Start:  11/24/23    Expected End:  05/26/24         Encourage Franky to participate in recovery peer support activities      Start:  11/24/23         Educate Franky on relaxation techniques and the rationale for learning these techniques     Start:  11/24/23         Educate Franky on cognitive restructuring techniques to address hallucinations and their rationale     Start:  11/24/23            Bipolar Disorder/depression Problems     Bipolar Disorder/depression Problems (Active)     OP Depression     LTG: Reduce  frequency, intensity, and duration of depression symptoms so that daily functioning is improved (Initial)     Start:  08/01/24         LTG: Increase coping skills to manage depression and improve ability to perform daily activities (Initial)     Start:  08/01/24              ProgressTowards Goals: Progressing  Interventions: CBT  Summary: Venice Liz is a 44 y.o. male who presents with Bipolar II, major depressive episode  Suicidal/Homicidal: Nowithout intent/plan  Therapist Response: Nachum engaged well in individual virtual session with facilities manager. Clinician utilized CBT to process thoughts, feelings, and behaviors. Clinician explored mood and experiences over the holidays this year as compared to previous years. Elya shared that he continues to abstain from alcohol and drugs, he is reducing nicotine  and plans to quit. He no longer smokes cigarettes, but has started dipping/chewing tobacco. Clinician discussed stress management and his own personal coping strategies. Cedrik shared he is doing a lot of bible study and is participating in church regularly. He reports he stays close to family and he is always doing something in the yard or in the house to clean up or beautify his space. Clinician explored auditory hallucinations and noted that he has gotten better at ignoring them and he uses his work and projects as a way to get away from the voices.   Plan: Return again in 3-4 weeks.  Diagnosis: Bipolar 2  disorder, major depressive episode (HCC)  Collaboration of Care: Medication Management AEB communicated with Staci Kerns about medication. Temitayo shared that he has increased his Abilify  from 5mg  to 10mg  without consultation. Staci is aware and shared plan to use long-acting injection if Abilify  is well tolerated.  Patient/Guardian was advised Release of Information must be obtained prior to any record release in order to collaborate their care with an outside provider.  Patient/Guardian was advised if they have not already done so to contact the registration department to sign all necessary forms in order for us  to release information regarding their care.   Consent: Patient/Guardian gives verbal consent for treatment and assignment of benefits for services provided during this visit. Patient/Guardian expressed understanding and agreed to proceed.   Harlene SAUNDERS [place], LCSW 08/01/2024

## 2024-08-02 ENCOUNTER — Ambulatory Visit
Admission: RE | Admit: 2024-08-02 | Discharge: 2024-08-02 | Disposition: A | Discharge status: Home / Self Care | Payer: Worker's Compensation | Source: Ambulatory Visit | Attending: Gastroenterology | Admitting: Gastroenterology

## 2024-08-02 ENCOUNTER — Other Ambulatory Visit: Payer: Self-pay | Admitting: Gastroenterology

## 2024-08-02 DIAGNOSIS — Y990 Civilian activity done for income or pay: Secondary | ICD-10-CM

## 2024-08-02 DIAGNOSIS — S61412A Laceration without foreign body of left hand, initial encounter: Secondary | ICD-10-CM | POA: Insufficient documentation

## 2024-08-02 DIAGNOSIS — W312XXA Contact with powered woodworking and forming machines, initial encounter: Secondary | ICD-10-CM | POA: Diagnosis not present

## 2024-08-08 ENCOUNTER — Ambulatory Visit: Admitting: Nurse Practitioner

## 2024-08-13 ENCOUNTER — Ambulatory Visit: Payer: Self-pay

## 2024-08-13 ENCOUNTER — Ambulatory Visit: Admitting: Nurse Practitioner

## 2024-08-13 ENCOUNTER — Encounter: Payer: Self-pay | Admitting: Nurse Practitioner

## 2024-08-13 VITALS — BP 117/84 | HR 83 | Temp 98.1°F | Ht 74.0 in | Wt 219.4 lb

## 2024-08-13 DIAGNOSIS — S76311D Strain of muscle, fascia and tendon of the posterior muscle group at thigh level, right thigh, subsequent encounter: Secondary | ICD-10-CM

## 2024-08-13 NOTE — Telephone Encounter (Signed)
" ° ° °  Summary: severe pain   Reason for Triage: patient called stated he is experiencing severe pain in his back and upper right thigh and sometimes radiates to the knee.    Reason for Disposition  [1] SEVERE back pain (e.g., excruciating, unable to do any normal activities) AND [2] not improved 2 hours after pain medicine  Answer Assessment - Initial Assessment Questions Back pain is getting worse, he has had an US  to r/o DVT. Nothing touches pain in leg. He states pain is over all getting worse. He states he does sometimes have upper abdominal pain. He also occasionally has weakness in that right leg when he goes from sitting to standing but once he starts moving it goes away. He is taking ibuprofen  and is out of hydrocodone .    1. ONSET: When did the pain begin? (e.g., minutes, hours, days)     ongoing 2. LOCATION: Where does it hurt? (upper, mid or lower back)     Low right back to right leg 3. SEVERITY: How bad is the pain?  (e.g., Scale 1-10; mild, moderate, or severe)     10/10 4. PATTERN: Is the pain constant? (e.g., yes, no; constant, intermittent)       5. RADIATION: Does the pain shoot into your legs or somewhere else?     Down to right thigh 6. CAUSE:  What do you think is causing the back pain?      unknown 7. BACK OVERUSE:  Any recent lifting of heavy objects, strenuous work or exercise?      8. MEDICINES: What have you taken so far for the pain? (e.g., nothing, acetaminophen , NSAIDS)     Ibuprofen - states doesn't touch it 9. NEUROLOGIC SYMPTOMS: Do you have any weakness, numbness, or problems with bowel/bladder control?     Weakness in that leg if going sitting to standing.  10. OTHER SYMPTOMS: Do you have any other symptoms? (e.g., fever, abdomen pain, burning with urination, blood in urine)       Upper abdominal pain at times.  Protocols used: Back Pain-A-AH  "

## 2024-08-13 NOTE — Progress Notes (Signed)
 "  Subjective:    Patient ID: Jose Mitchell, male    DOB: [birth year], 44 y.o.   MRN: [id number]  HPI Discussed the use of AI scribe software for clinical note transcription with the patient, who gave verbal consent to proceed.  History of Present Illness Jose Mitchell is a 44 year old male who presents with right leg pain persisting for three months.  He has been experiencing a dull and pulling sensation in his posterior right leg for the past three months. The pain is primarily located in the right leg and does not radiate down the leg as it did prior to his back surgery two years ago. No recent injuries, accidents, or falls have occurred that could have triggered the pain.  An ultrasound performed at urgent care 07/17/24 ruled out a blood clot. The pain is exacerbated by driving, particularly due to the position of his foot on the pedal, and prolonged sitting. Bending also causes discomfort, but standing and walking do not significantly worsen the pain.  He has tried various treatments including topical rubs, ibuprofen  800 mg, and hydrocodone , none of which have provided significant relief. He has not tried methocarbamol  (Robaxin ) or other muscle relaxants recently. He has not used ice or heat therapy.  He had back surgery two years ago and experiences some mild localized pain at the surgical site. He works at a [employer] and drives approximately 30 minutes to and from work. He engages in walking and stretching at work, but does not perform specific stretching exercises for his leg pain.   Social History[1]      Objective:   Physical Exam Vitals and nursing note reviewed.  Constitutional:      General: He is not in acute distress. Cardiovascular:     Rate and Rhythm: Normal rate and regular rhythm.  Pulmonary:     Effort: Pulmonary effort is normal.     Breath sounds: Normal breath sounds.  Musculoskeletal:     Right lower leg: No edema.     Left lower leg: No edema.      Comments: Localized area of tenderness mid posterior right thigh to palpation. No erythema, warmth, edema or mass.   Neurological:     Mental Status: He is alert and oriented to person, place, and time.     Gait: Gait abnormal.     Comments: Slight limp when first gets out of chair. Favoring his right leg.   Psychiatric:        Mood and Affect: Mood normal.        Behavior: Behavior normal.        Thought Content: Thought content normal.    Today's Vitals   08/13/24 1114  BP: 117/84  Pulse: 83  Temp: 98.1 F (36.7 C)  SpO2: 98%  Weight: 219 lb 6 oz (99.5 kg)  Height: 6' 2 (1.88 m)   Body mass index is 28.17 kg/m.      Assessment & Plan:  1. Strain of right hamstring muscle, subsequent encounter (Primary) Has tried multiple modalities for his pain and has been present for 3 months. Patient agrees to go to local [facility] office for evaluation. A referral is not required but call if he needs assistance.           [1]  Social History Tobacco Use   Smoking status: Former    Current packs/day: 0.00    Average packs/day: 1.0 packs/day    Types: Cigarettes    Quit date: 2021  Years since quitting: 5.0   Smokeless tobacco: Current    Types: Chew  Vaping Use   Vaping status: Never Used  Substance Use Topics   Alcohol use: Not Currently   Drug use: Not Currently    Types: Cocaine   "

## 2024-08-13 NOTE — Telephone Encounter (Signed)
Noted patient scheduled

## 2024-08-13 NOTE — Telephone Encounter (Signed)
 FYI Only or Action Required?: FYI only for provider: appointment scheduled on 1.19.26.  Patient was last seen in primary care on 05/22/2024 by Tobie Suzzane POUR, MD.  Called Nurse Triage reporting Back Pain.  Symptoms began several months ago.  Interventions attempted: Prescription medications: ibuprofen .  Symptoms are: unchanged.  Triage Disposition: See HCP Within 4 Hours (Or PCP Triage)  Patient/caregiver understands and will follow disposition?: Yes

## 2024-08-14 ENCOUNTER — Encounter (HOSPITAL_COMMUNITY): Payer: Self-pay | Admitting: Emergency Medicine

## 2024-08-14 ENCOUNTER — Ambulatory Visit (HOSPITAL_COMMUNITY): Admission: EM | Admit: 2024-08-14 | Discharge: 2024-08-14 | Disposition: A | Discharge status: Home / Self Care

## 2024-08-14 ENCOUNTER — Other Ambulatory Visit: Payer: Self-pay

## 2024-08-14 ENCOUNTER — Other Ambulatory Visit (HOSPITAL_COMMUNITY): Admission: EM | Admit: 2024-08-14 | Discharge: 2024-08-15 | Disposition: A | Discharge status: Home / Self Care

## 2024-08-14 DIAGNOSIS — R000 Tachycardia, unspecified: Secondary | ICD-10-CM

## 2024-08-14 DIAGNOSIS — F200 Paranoid schizophrenia: Secondary | ICD-10-CM | POA: Diagnosis present

## 2024-08-14 DIAGNOSIS — F1994 Other psychoactive substance use, unspecified with psychoactive substance-induced mood disorder: Secondary | ICD-10-CM

## 2024-08-14 DIAGNOSIS — F1410 Cocaine abuse, uncomplicated: Secondary | ICD-10-CM

## 2024-08-14 DIAGNOSIS — F19159 Other psychoactive substance abuse with psychoactive substance-induced psychotic disorder, unspecified: Secondary | ICD-10-CM | POA: Insufficient documentation

## 2024-08-14 DIAGNOSIS — F19951 Other psychoactive substance use, unspecified with psychoactive substance-induced psychotic disorder with hallucinations: Secondary | ICD-10-CM

## 2024-08-14 DIAGNOSIS — F22 Delusional disorders: Secondary | ICD-10-CM | POA: Diagnosis present

## 2024-08-14 DIAGNOSIS — F17210 Nicotine dependence, cigarettes, uncomplicated: Secondary | ICD-10-CM | POA: Insufficient documentation

## 2024-08-14 DIAGNOSIS — R450 Nervousness: Secondary | ICD-10-CM | POA: Diagnosis not present

## 2024-08-14 LAB — CBC WITH DIFFERENTIAL/PLATELET
Abs Immature Granulocytes: 0.09 K/uL — ABNORMAL HIGH (ref 0.00–0.07)
Basophils Absolute: 0.1 K/uL (ref 0.0–0.1)
Basophils Relative: 1 %
Eosinophils Absolute: 0 K/uL (ref 0.0–0.5)
Eosinophils Relative: 0 %
HCT: 49.2 % (ref 39.0–52.0)
Hemoglobin: 17.1 g/dL — ABNORMAL HIGH (ref 13.0–17.0)
Immature Granulocytes: 1 %
Lymphocytes Relative: 8 %
Lymphs Abs: 1.3 K/uL (ref 0.7–4.0)
MCH: 29.9 pg (ref 26.0–34.0)
MCHC: 34.8 g/dL (ref 30.0–36.0)
MCV: 86.2 fL (ref 80.0–100.0)
Monocytes Absolute: 0.8 K/uL (ref 0.1–1.0)
Monocytes Relative: 5 %
Neutro Abs: 14.8 K/uL — ABNORMAL HIGH (ref 1.7–7.7)
Neutrophils Relative %: 85 %
Platelets: 299 K/uL (ref 150–400)
RBC: 5.71 MIL/uL (ref 4.22–5.81)
RDW: 12.1 % (ref 11.5–15.5)
WBC: 17.1 K/uL — ABNORMAL HIGH (ref 4.0–10.5)
nRBC: 0 % (ref 0.0–0.2)

## 2024-08-14 LAB — COMPREHENSIVE METABOLIC PANEL WITH GFR
ALT: 22 U/L (ref 0–44)
AST: 23 U/L (ref 15–41)
Albumin: 5.1 g/dL — ABNORMAL HIGH (ref 3.5–5.0)
Alkaline Phosphatase: 86 U/L (ref 38–126)
Anion gap: 16 — ABNORMAL HIGH (ref 5–15)
BUN: 9 mg/dL (ref 6–20)
CO2: 21 mmol/L — ABNORMAL LOW (ref 22–32)
Calcium: 10.4 mg/dL — ABNORMAL HIGH (ref 8.9–10.3)
Chloride: 101 mmol/L (ref 98–111)
Creatinine, Ser: 1.25 mg/dL — ABNORMAL HIGH (ref 0.61–1.24)
GFR, Estimated: >60 mL/min
Glucose, Bld: 170 mg/dL — ABNORMAL HIGH (ref 70–99)
Potassium: 4.2 mmol/L (ref 3.5–5.1)
Sodium: 138 mmol/L (ref 135–145)
Total Bilirubin: 0.2 mg/dL (ref 0.0–1.2)
Total Protein: 8.3 g/dL — ABNORMAL HIGH (ref 6.5–8.1)

## 2024-08-14 LAB — ETHANOL: Alcohol, Ethyl (B): 20 mg/dL — ABNORMAL HIGH

## 2024-08-14 LAB — POCT URINE DRUG SCREEN - MANUAL ENTRY (I-SCREEN)
POC Amphetamine UR: NOT DETECTED
POC Buprenorphine (BUP): NOT DETECTED
POC Cocaine UR: POSITIVE — AB
POC Marijuana UR: NOT DETECTED
POC Methadone UR: NOT DETECTED
POC Methamphetamine UR: NOT DETECTED
POC Morphine: NOT DETECTED
POC Oxazepam (BZO): NOT DETECTED
POC Oxycodone UR: NOT DETECTED
POC Secobarbital (BAR): NOT DETECTED

## 2024-08-14 MED ORDER — ARIPIPRAZOLE 5 MG PO TABS
5.0000 mg | ORAL_TABLET | Freq: Every day | ORAL | Status: DC
  Administered 2024-08-14: 5 mg via ORAL
  Filled 2024-08-14: qty 1

## 2024-08-14 MED ORDER — ONDANSETRON 4 MG PO TBDP: 4.0000 mg | ORAL_TABLET | Freq: Four times a day (QID) | ORAL | Status: DC | PRN

## 2024-08-14 MED ORDER — METHOCARBAMOL 500 MG PO TABS
500.0000 mg | ORAL_TABLET | Freq: Two times a day (BID) | ORAL | Status: DC | PRN
  Administered 2024-08-14: 500 mg via ORAL
  Filled 2024-08-14: qty 1

## 2024-08-14 MED ORDER — OLANZAPINE 5 MG PO TABS
5.0000 mg | ORAL_TABLET | Freq: Every day | ORAL | Status: DC
  Administered 2024-08-14: 5 mg via ORAL
  Filled 2024-08-14: qty 1

## 2024-08-14 MED ORDER — LOPERAMIDE HCL 2 MG PO CAPS: 2.0000 mg | ORAL_CAPSULE | ORAL | Status: DC | PRN

## 2024-08-14 MED ORDER — PROPRANOLOL HCL 10 MG PO TABS
10.0000 mg | ORAL_TABLET | Freq: Two times a day (BID) | ORAL | Status: DC
  Administered 2024-08-15: 10 mg via ORAL
  Filled 2024-08-14 (×2): qty 1

## 2024-08-14 MED ORDER — ADULT MULTIVITAMIN W/MINERALS CH
1.0000 | ORAL_TABLET | Freq: Every day | ORAL | Status: DC
  Administered 2024-08-14: 1 via ORAL
  Filled 2024-08-14: qty 1

## 2024-08-14 MED ORDER — IBUPROFEN 400 MG PO TABS
800.0000 mg | ORAL_TABLET | Freq: Three times a day (TID) | ORAL | Status: DC | PRN
  Administered 2024-08-14: 800 mg via ORAL
  Filled 2024-08-14: qty 2

## 2024-08-14 MED ORDER — ARIPIPRAZOLE 5 MG PO TABS
5.0000 mg | ORAL_TABLET | Freq: Every day | ORAL | Status: DC
  Administered 2024-08-15: 5 mg via ORAL
  Filled 2024-08-14: qty 1

## 2024-08-14 MED ORDER — MELATONIN 3 MG PO TABS
3.0000 mg | ORAL_TABLET | Freq: Every evening | ORAL | Status: DC | PRN
  Administered 2024-08-14: 3 mg via ORAL
  Filled 2024-08-14: qty 1

## 2024-08-14 MED ORDER — DIPHENHYDRAMINE HCL 50 MG PO CAPS
50.0000 mg | ORAL_CAPSULE | Freq: Three times a day (TID) | ORAL | Status: DC | PRN
  Administered 2024-08-14: 50 mg via ORAL
  Filled 2024-08-14: qty 1

## 2024-08-14 MED ORDER — DIPHENHYDRAMINE HCL 50 MG/ML IJ SOLN: 50.0000 mg | Freq: Three times a day (TID) | INTRAMUSCULAR | Status: DC | PRN

## 2024-08-14 MED ORDER — HYDROXYZINE HCL 25 MG PO TABS
25.0000 mg | ORAL_TABLET | Freq: Three times a day (TID) | ORAL | Status: DC | PRN
  Administered 2024-08-14: 25 mg via ORAL
  Filled 2024-08-14: qty 1

## 2024-08-14 MED ORDER — MAGNESIUM HYDROXIDE 400 MG/5ML PO SUSP: 30.0000 mL | Freq: Every day | ORAL | Status: DC | PRN

## 2024-08-14 MED ORDER — HALOPERIDOL 5 MG PO TABS: 5.0000 mg | ORAL_TABLET | Freq: Three times a day (TID) | ORAL | Status: DC | PRN

## 2024-08-14 MED ORDER — ALUM & MAG HYDROXIDE-SIMETH 200-200-20 MG/5ML PO SUSP: 30.0000 mL | ORAL | Status: DC | PRN

## 2024-08-14 MED ORDER — HALOPERIDOL LACTATE 5 MG/ML IJ SOLN: 5.0000 mg | Freq: Three times a day (TID) | INTRAMUSCULAR | Status: DC | PRN

## 2024-08-14 MED ORDER — THIAMINE HCL 100 MG/ML IJ SOLN
100.0000 mg | Freq: Once | INTRAMUSCULAR | Status: AC
  Administered 2024-08-14: 100 mg via INTRAMUSCULAR
  Filled 2024-08-14: qty 2

## 2024-08-14 MED ORDER — HYDROXYZINE HCL 25 MG PO TABS: 25.0000 mg | ORAL_TABLET | Freq: Four times a day (QID) | ORAL | Status: DC | PRN

## 2024-08-14 MED ORDER — ACETAMINOPHEN 325 MG PO TABS: 650.0000 mg | ORAL_TABLET | Freq: Four times a day (QID) | ORAL | Status: DC | PRN

## 2024-08-14 MED ORDER — PANTOPRAZOLE SODIUM 40 MG PO TBEC: 40.0000 mg | DELAYED_RELEASE_TABLET | Freq: Every day | ORAL | Status: DC

## 2024-08-14 MED ORDER — ADULT MULTIVITAMIN W/MINERALS CH
1.0000 | ORAL_TABLET | Freq: Every day | ORAL | Status: DC
  Administered 2024-08-15: 1 via ORAL
  Filled 2024-08-14: qty 1

## 2024-08-14 MED ORDER — DIPHENHYDRAMINE HCL 50 MG PO CAPS: 50.0000 mg | ORAL_CAPSULE | Freq: Three times a day (TID) | ORAL | Status: DC | PRN

## 2024-08-14 MED ORDER — LORAZEPAM 2 MG/ML IJ SOLN: 2.0000 mg | Freq: Three times a day (TID) | INTRAMUSCULAR | Status: DC | PRN

## 2024-08-14 MED ORDER — PANTOPRAZOLE SODIUM 40 MG PO TBEC
40.0000 mg | DELAYED_RELEASE_TABLET | Freq: Every day | ORAL | Status: DC
  Administered 2024-08-14: 40 mg via ORAL
  Filled 2024-08-14: qty 1

## 2024-08-14 MED ORDER — HALOPERIDOL LACTATE 5 MG/ML IJ SOLN: 10.0000 mg | Freq: Three times a day (TID) | INTRAMUSCULAR | Status: DC | PRN

## 2024-08-14 MED ORDER — HYDROXYZINE HCL 25 MG PO TABS: 25.0000 mg | ORAL_TABLET | Freq: Three times a day (TID) | ORAL | Status: DC | PRN

## 2024-08-14 MED ORDER — PANTOPRAZOLE SODIUM 40 MG PO TBEC
40.0000 mg | DELAYED_RELEASE_TABLET | Freq: Every day | ORAL | Status: DC
  Administered 2024-08-15: 40 mg via ORAL
  Filled 2024-08-14: qty 1

## 2024-08-14 MED ORDER — PROPRANOLOL HCL 10 MG PO TABS
10.0000 mg | ORAL_TABLET | Freq: Two times a day (BID) | ORAL | Status: DC
  Administered 2024-08-14: 10 mg via ORAL
  Filled 2024-08-14: qty 1

## 2024-08-14 MED ORDER — CHLORDIAZEPOXIDE HCL 25 MG PO CAPS: 25.0000 mg | ORAL_CAPSULE | Freq: Four times a day (QID) | ORAL | Status: DC | PRN

## 2024-08-14 MED ORDER — IBUPROFEN 400 MG PO TABS
800.0000 mg | ORAL_TABLET | Freq: Three times a day (TID) | ORAL | Status: DC | PRN
  Administered 2024-08-15: 800 mg via ORAL
  Filled 2024-08-14: qty 2

## 2024-08-14 MED ORDER — MELATONIN 3 MG PO TABS: 3.0000 mg | ORAL_TABLET | Freq: Every evening | ORAL | Status: DC | PRN

## 2024-08-14 MED ORDER — METHOCARBAMOL 500 MG PO TABS: 500.0000 mg | ORAL_TABLET | Freq: Two times a day (BID) | ORAL | Status: DC | PRN

## 2024-08-14 NOTE — ED Notes (Signed)
 Pt sleeping in no acute distress. RR even and unlabored. Environment secured. Will continue to monitor for safety.

## 2024-08-14 NOTE — Progress Notes (Signed)
 Pt is asleep. Respirations are even and unlabored. No signs of acute distress noted. Staff will monitor for pt's safety.

## 2024-08-14 NOTE — Progress Notes (Signed)
 Pt is awake, alert and oriented X4. Pt complained of chronic back pain 4/10. No signs of acute distress noted. Administered scheduled meds per order. Pt denies current SI/HI/AVH, plan or intent. Staff will monitor for pt's safety.

## 2024-08-14 NOTE — ED Provider Notes (Addendum)
 [facility] Continuous Assessment Admission H&P  Date: 08/14/24 Patient Name: Jose Mitchell MRN: [id number] Chief Complaint: I think people are out to get me all the time.   Diagnoses:  Final diagnoses:  Paranoia (HCC)  Paranoid schizophrenia (HCC)  Substance induced mood disorder (HCC)    HPI: Ahmarion Saraceno is a 44 year old male with psychiatric history of bipolar disorder, schizoaffective disorder, anxiety reaction, schizophrenia paranoid type, alcohol and cocaine abuse, who presented voluntarily to [facility] via [facility], with complaints of paranoia and anxiety.  Patient was seen face-to-face by this provider and chart reviewed.  Patient presents as worried and reports my schizophrenia and anxiety just went off the roof today. It gets like this, up and down, and when it started today, I drank a bottle of wine, I also smoked a few cigarettes and some tobacco and I think people are out to get me all the time. I try to put it behind me but it comes on once in a while and I'm constantly thinking negative thoughts....in my mind, I think people have a problem with me but when they come close to me, there is no problem. I've been in and out of jail and prison, and sometimes the paranoia is worse.  Patient endorses auditory hallucinations of mumbling voices which started early today and states  I can be working and I hear it and try to shake it off, I'm always high alert.   Patient endorses drinking a bottle of wine today. Smoking a few cigarettes and tobacco due to paranoia and anxiety. He reports being sober from alcohol for 8 years until today.   Patient reports he is established with [facility] with Linnea Kerns and was started on Abilify  5 mg on Dec 23rd, but the medication makes him feel angry whenever he takes it. He endorses seeing a therapist a same center.  He is amenable to overnight stay and agreeable to admission to continuous observation unit for  safety monitoring and re-eval in the am for SI/HI/AVH and paranoia.  Patient is provided with opportunity for questions . He verbalized understanding and is in agreement.   On evaluation, patient is alert, oriented x 3, and cooperative. Speech is clear, and coherent. Pt appears casually dressed. Eye contact is good. Mood is anxious, affect is congruent with mood. Thought process is coherent and thought content is WDL. Pt denies SI/HI/VH. He endorses AH and paranoia. There is no objective indication that the patient is responding to internal stimuli. No delusions elicited during this assessment.    Total Time spent with patient: 30 minutes  Musculoskeletal  Strength & Muscle Tone: within normal limits Gait & Station: normal Patient leans: N/A  Psychiatric Specialty Exam  Presentation General Appearance:  Casual  Eye Contact: Good  Speech: Clear and Coherent  Speech Volume: Normal  Handedness: Right   Mood and Affect  Mood: Anxious  Affect: Congruent   Thought Process  Thought Processes: Coherent  Descriptions of Associations:Intact  Orientation:Full (Time, Place and Person)  Thought Content:WDL  Diagnosis of Schizophrenia or Schizoaffective disorder in past: No   Hallucinations:Hallucinations: Auditory Description of Auditory Hallucinations: Pt reports hearing muffled voices  Ideas of Reference:Paranoia  Suicidal Thoughts:Suicidal Thoughts: No  Homicidal Thoughts:Homicidal Thoughts: No   Sensorium  Memory: Immediate Fair  Judgment: Fair  Insight: Present   Executive Functions  Concentration: Good  Attention Span: Good  Recall: Good  Fund of Knowledge: Good  Language: Good   Psychomotor Activity  Psychomotor  Activity: Psychomotor Activity: Normal   Assets  Assets: Communication Skills; Desire for Improvement   Sleep  Sleep: Sleep: Poor   Nutritional Assessment (For OBS and FBC admissions only) Has the patient had a  weight loss or gain of 10 pounds or more in the last 3 months?: No Has the patient had a decrease in food intake/or appetite?: No Does the patient have dental problems?: No Does the patient have eating habits or behaviors that may be indicators of an eating disorder including binging or inducing vomiting?: No Has the patient recently lost weight without trying?: 0 Has the patient been eating poorly because of a decreased appetite?: 0 Malnutrition Screening Tool Score: 0    Physical Exam Constitutional:      General: He is not in acute distress.    Appearance: He is not diaphoretic.  HENT:     Nose: No congestion.  Pulmonary:     Effort: No respiratory distress.  Chest:     Chest wall: No tenderness.  Neurological:     Mental Status: He is alert and oriented to person, place, and time.  Psychiatric:        Attention and Perception: He perceives auditory hallucinations.        Mood and Affect: Mood is anxious. Affect is blunt.        Speech: Speech normal.        Behavior: Behavior is cooperative.        Thought Content: Thought content is paranoid.    Review of Systems  Constitutional:  Negative for chills, diaphoresis and fever.  HENT:  Negative for congestion.   Eyes:  Negative for discharge.  Respiratory:  Negative for cough, shortness of breath and wheezing.   Cardiovascular:  Negative for chest pain and palpitations.  Gastrointestinal:  Negative for diarrhea, nausea and vomiting.  Neurological:  Negative for dizziness, seizures, loss of consciousness, weakness and headaches.  Psychiatric/Behavioral:  Positive for hallucinations and substance abuse. The patient is nervous/anxious.     Blood pressure 124/86, pulse (!) 120, temperature 98.7 F (37.1 C), temperature source Oral, resp. rate 20, SpO2 96%. There is no height or weight on file to calculate BMI.  Past Psychiatric History: See H & P   Is the patient at risk to self? Yes  Has the patient been a risk to self in  the past 6 months? Yes .    Has the patient been a risk to self within the distant past? Yes   Is the patient a risk to others? Yes   Has the patient been a risk to others in the past 6 months? Yes   Has the patient been a risk to others within the distant past? Yes   Past Medical History: See Chart  Family History: N/A  Social History: N/A  Last Labs:  Admission on 08/14/2024  Component Date Value Ref Range Status   POC Amphetamine UR 08/14/2024 None Detected  NONE DETECTED (Cut Off Level 1000 ng/mL) Final   POC Secobarbital (BAR) 08/14/2024 None Detected  NONE DETECTED (Cut Off Level 300 ng/mL) Final   POC Buprenorphine (BUP) 08/14/2024 None Detected  NONE DETECTED (Cut Off Level 10 ng/mL) Final   POC Oxazepam (BZO) 08/14/2024 None Detected  NONE DETECTED (Cut Off Level 300 ng/mL) Final   POC Cocaine UR 08/14/2024 Positive (A)  NONE DETECTED (Cut Off Level 300 ng/mL) Final   POC Methamphetamine UR 08/14/2024 None Detected  NONE DETECTED (Cut Off Level 1000 ng/mL) Final  POC Morphine 08/14/2024 None Detected  NONE DETECTED (Cut Off Level 300 ng/mL) Final   POC Methadone UR 08/14/2024 None Detected  NONE DETECTED (Cut Off Level 300 ng/mL) Final   POC Oxycodone  UR 08/14/2024 None Detected  NONE DETECTED (Cut Off Level 100 ng/mL) Final   POC Marijuana UR 08/14/2024 None Detected  NONE DETECTED (Cut Off Level 50 ng/mL) Final  Office Visit on 05/22/2024  Component Date Value Ref Range Status   WBC 05/23/2024 10.6  3.4 - 10.8 x10E3/uL Final   RBC 05/23/2024 5.24  4.14 - 5.80 x10E6/uL Final   Hemoglobin 05/23/2024 15.7  13.0 - 17.7 g/dL Final   Hematocrit 89/70/7974 46.8  37.5 - 51.0 % Final   MCV 05/23/2024 89  79 - 97 fL Final   MCH 05/23/2024 30.0  26.6 - 33.0 pg Final   MCHC 05/23/2024 33.5  31.5 - 35.7 g/dL Final   RDW 89/70/7974 12.3  11.6 - 15.4 % Final   Platelets 05/23/2024 304  150 - 450 x10E3/uL Final   Neutrophils 05/23/2024 54  Not Estab. % Final   Lymphs 05/23/2024 34   Not Estab. % Final   Monocytes 05/23/2024 8  Not Estab. % Final   Eos 05/23/2024 3  Not Estab. % Final   Basos 05/23/2024 1  Not Estab. % Final   Neutrophils Absolute 05/23/2024 5.8  1.4 - 7.0 x10E3/uL Final   Lymphocytes Absolute 05/23/2024 3.6 (H)  0.7 - 3.1 x10E3/uL Final   Monocytes Absolute 05/23/2024 0.8  0.1 - 0.9 x10E3/uL Final   EOS (ABSOLUTE) 05/23/2024 0.3  0.0 - 0.4 x10E3/uL Final   Basophils Absolute 05/23/2024 0.1  0.0 - 0.2 x10E3/uL Final   Immature Granulocytes 05/23/2024 0  Not Estab. % Final   Immature Grans (Abs) 05/23/2024 0.0  0.0 - 0.1 x10E3/uL Final   Glucose 05/23/2024 77  70 - 99 mg/dL Final   BUN 89/70/7974 8  6 - 24 mg/dL Final   Creatinine, Ser 05/23/2024 1.11  0.76 - 1.27 mg/dL Final   eGFR 89/70/7974 84  >59 mL/min/1.73 Final   BUN/Creatinine Ratio 05/23/2024 7 (L)  9 - 20 Final   Sodium 05/23/2024 140  134 - 144 mmol/L Final   Potassium 05/23/2024 4.1  3.5 - 5.2 mmol/L Final   Chloride 05/23/2024 100  96 - 106 mmol/L Final   CO2 05/23/2024 23  20 - 29 mmol/L Final   Calcium 05/23/2024 9.8  8.7 - 10.2 mg/dL Final   Total Protein 89/70/7974 6.9  6.0 - 8.5 g/dL Final   Albumin 89/70/7974 4.7  4.1 - 5.1 g/dL Final   Globulin, Total 05/23/2024 2.2  1.5 - 4.5 g/dL Final   Bilirubin Total 05/23/2024 0.3  0.0 - 1.2 mg/dL Final   Alkaline Phosphatase 05/23/2024 66  47 - 123 IU/L Final   AST 05/23/2024 17  0 - 40 IU/L Final   ALT 05/23/2024 15  0 - 44 IU/L Final   Hgb A1c MFr Bld 05/23/2024 5.9 (H)  4.8 - 5.6 % Final   Comment:          Prediabetes: 5.7 - 6.4          Diabetes: >6.4          Glycemic control for adults with diabetes: <7.0    Est. average glucose Bld gHb Est-m* 05/23/2024 123  mg/dL Final   Cholesterol, Total 05/23/2024 144  100 - 199 mg/dL Final   Triglycerides 89/70/7974 97  0 - 149 mg/dL Final  HDL 05/23/2024 45  >39 mg/dL Final   VLDL Cholesterol Cal 05/23/2024 18  5 - 40 mg/dL Final   LDL Chol Calc (NIH) 05/23/2024 81  0 - 99 mg/dL  Final   Chol/HDL Ratio 05/23/2024 3.2  0.0 - 5.0 ratio Final   Comment:                                   T. Chol/HDL Ratio                                             Men  Women                               1/2 Avg.Risk  3.4    3.3                                   Avg.Risk  5.0    4.4                                2X Avg.Risk  9.6    7.1                                3X Avg.Risk 23.4   11.0    TSH 05/23/2024 0.650  0.450 - 4.500 uIU/mL Final    Allergies: Sulfamethoxazole, Trimethoprim, and Sulfamethoxazole-trimethoprim  Medications:  Facility Ordered Medications  Medication   magnesium  hydroxide (MILK OF MAGNESIA) suspension 30 mL   haloperidol  (HALDOL ) tablet 5 mg   And   diphenhydrAMINE  (BENADRYL ) capsule 50 mg   haloperidol  lactate (HALDOL ) injection 5 mg   And   diphenhydrAMINE  (BENADRYL ) injection 50 mg   And   LORazepam  (ATIVAN ) injection 2 mg   haloperidol  lactate (HALDOL ) injection 10 mg   And   diphenhydrAMINE  (BENADRYL ) injection 50 mg   And   LORazepam  (ATIVAN ) injection 2 mg   hydrOXYzine  (ATARAX ) tablet 25 mg   OLANZapine  (ZYPREXA ) tablet 5 mg   melatonin tablet 3 mg   methocarbamol  (ROBAXIN ) tablet 500 mg   pantoprazole  (PROTONIX ) EC tablet 40 mg   ibuprofen  (ADVIL ) tablet 800 mg   PTA Medications  Medication Sig   methocarbamol  (ROBAXIN ) 500 MG tablet Take 1 tablet (500 mg total) by mouth 2 (two) times daily as needed for muscle spasms. Do not take with alcohol or while driving or operating heavy machinery.  May cause drowsiness.   gabapentin  (NEURONTIN ) 100 MG capsule Take 100 mg once daily 2 hours before bedtime   ARIPiprazole  (ABILIFY ) 5 MG tablet Take 1 tablet (5 mg total) by mouth daily.   pantoprazole  (PROTONIX ) 40 MG tablet Take 1 tablet (40 mg total) by mouth daily.   ibuprofen  (ADVIL ) 800 MG tablet Take 1 tablet (800 mg total) by mouth every 8 (eight) hours as needed. Take with food to prevent GI upset      Medical Decision Making   Recommend admission to continuous observation unit for safety monitoring and re-eval in the am.  Recommend CIWA Protocol-BAL 20  Lab Orders  CBC with Differential/Platelet         Comprehensive metabolic panel         Ethanol         POCT Urine Drug Screen - (I-Screen)      Medication started -Olanzapine  5 mg PO daily at bedtime for mood stabilization/schizophrenia Home Meds continued -Ibuprofen  800 mg PO Q8H Prn Pain -Protonix  EC 40 mg PO daily for indigestion -Robaxin  500 mg PO BID prn muscle spasm  Other Prns -Melatonin, MOM, Atarax  -Agitation protocol medications   Recommendations  Based on my evaluation the patient does not appear to have an emergency medical condition.  Recommend admission to continuous observation unit for safety monitoring and re-eval in the am. CIWA Protocol   Shelva Hetzer LULLA Ivans, NP 08/14/24  3:51 AM

## 2024-08-14 NOTE — ED Notes (Signed)
 Pt transferred from observation unit to [facility] endorsing paranoid behaviors thinking people are behind him that are not. Pt reports intermittent hallucinations of people in the room that are not there. Pt denies SI/HI. Pt states, I know they are going to tell you that I'm here for substance abuse but I am not. I want to get my mental health under control. I am schizophrenic and I need help with that only. Support provided. Calm, cooperative throughout interview process. Skin assessment completed. Oriented to unit. Meal and drink offered. Pt verbally contract for safety. Will monitor for safety.

## 2024-08-14 NOTE — Progress Notes (Signed)
" °   08/14/24 0152  [facility] Triage Screening (Walk-ins at [facility] only)  How Did You Hear About Us ? Legal System  What Is the Reason for Your Visit/Call Today? Pt presents to [facility] voluntarily unaccompanied. Pt was brought in by sheriff. Pt states that he has been struggling with paranoia and depression. Pt states that hefeels his medications are not working. Pt reports that he feels someone is out to get him. Pt endores auditory hallunications. Pt reports he is unable to understand what the voices are saying to him. Pt reports that he has been sober for 8 years but drank a whole bottle of wine last night because of the worsen paranoia. Pt currently denies SI, HI, VH and drug use.  How Long Has This Been Causing You Problems? > than 6 months  Have You Recently Had Any Thoughts About Hurting Yourself? No  Are You Planning to Commit Suicide/Harm Yourself At This time? No  Have you Recently Had Thoughts About Hurting Someone Sherral? No  Are You Planning To Harm Someone At This Time? No  Physical Abuse Denies  Verbal Abuse Denies  Sexual Abuse Denies  Exploitation of patient/patient's resources Denies  Self-Neglect Denies  Possible abuse reported to:  (n/a)  Are you currently experiencing any auditory, visual or other hallucinations? Yes  Please explain the hallucinations you are currently experiencing: Pt reports hearing voices  Have You Used Any Alcohol or Drugs in the Past 24 Hours? Yes  What Did You Use and How Much? Pt reports drinking a whole bottle of wine  Do you have any current medical co-morbidities that require immediate attention? No  Clinician description of patient physical appearance/behavior: Pt was cooperative. Pt present with a depressed mood and flat affect.  What Do You Feel Would Help You the Most Today? Treatment for Depression or other mood problem;Medication(s)  If access to [facility] was not available, would you have sought care in the Emergency Department? Yes  Determination  of Need Urgent (48 hours)  Options For Referral Inpatient Hospitalization  Determination of Need filed? Yes    Flowsheet Row ED from 08/14/2024 in [facility] from 07/17/2024 in [facility] at [place] [facility] from 06/28/2024 in [facility] at [facility] RISK CATEGORY No Risk No Risk No Risk    "

## 2024-08-14 NOTE — ED Notes (Signed)
 Report called to [place], RN [facility]

## 2024-08-14 NOTE — Group Note (Signed)
 Group Topic: Communication  Group Date: 08/14/2024 Start Time: 1705 End Time: 1730 Facilitators: Herold Lajuana NOVAK, RN  Department: [facility]  Number of Participants: 6  Group Focus: coping skills Treatment Modality:  Individual Therapy Interventions utilized were leisure development Purpose: increase insight  Name: Jose Mitchell Date of Birth: 01/27/1981  MR: [id number]    Level of Participation: pt asleep Quality of Participation: pt asleep Interactions with others: n/a Mood/Affect: pt asleep Triggers (if applicable): n/a Cognition: n/a Progress: None Response: n/a Plan: follow-up needed  Patients Problems:  Patient Active Problem List   Diagnosis Date Noted   Paranoia (HCC) 08/14/2024   Encounter for general adult medical examination with abnormal findings 05/22/2024   Bipolar disorder, in partial remission, most recent episode mixed (HCC) 05/22/2024   Prediabetes 05/22/2024   Mixed hyperlipidemia 05/22/2024   Substance-induced psychotic disorder with hallucinations (HCC) 10/24/2023   Insomnia 08/19/2023   Gastroesophageal reflux disease 10/04/2022   PTSD (post-traumatic stress disorder) 05/03/2019

## 2024-08-14 NOTE — ED Notes (Signed)
 Patient evaluated this AM. Please refer to H&P performed earlier today for more comprehensive details. Patient reports continuing to feel paranoid for unknown reason. Patient states intermittent alcohol use and had relapsed on cocaine for unknown reason. He states he would like to have adjustments to his medication as he feel they are not helping with his paranoia. He states they are persistent of unknown etiology. Symptoms are more consistent with hypervigilance and anxiety as opposed to psychosis. 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 He appears to dismiss how substances can affect his brain and may be contributing to his mental health diagnosis. He denies SI/HI. He endorses intermittent VH although unclear if he perceives these hallucinations or he is hypervigilant of his environment.   At this point, patient does not meet criteria for [facility] as he is not an acute safety risk to self or others although he may benefit from adjustment of medication. While cocaine does not appear to be his primary complaint, patient would benefit from [facility] through medication adjustments. Further workup into PTSD vs GAD vs Bipolar Disorder may be helpful to ensure appropriate treatment.

## 2024-08-14 NOTE — Care Management (Signed)
 [facility] Care Management...  Writer met with the client to discuss discharge planning.  Writer informed the client the average stay is 3-5 days.  Client reports he was admitted to [facility] due to his schizophrenia  and paranoia.  Client reports using cocaine and wine prior to being admitted.    Client reports he has his own transportation and his own home.  Client reports seeing Staci Kerns for medication management.  Client has declined residential treatment.  Client reports he was brought to [facility] by the sheriff.  Client reports no legal concerns and he reports he's not on probation.  Client denies SI/HI and or plans.  Client reports no AVH.

## 2024-08-14 NOTE — Group Note (Signed)
 Group Topic: Overcoming Obstacles  Group Date: 08/14/2024 Start Time: 1100 End Time: 1200 Facilitators: Deidre Prentis CROME, NT  Department: [facility]  Number of Participants: 5  Group Focus: community group Treatment Modality:  Psychoeducation Interventions utilized were patient education Purpose: increase insight  Name: Jose Mitchell Date of Birth: 02/28/1981  MR: [id number]    Level of Participation: pt did not attend group.   Patients Problems:  Patient Active Problem List   Diagnosis Date Noted   Paranoia (HCC) 08/14/2024   Encounter for general adult medical examination with abnormal findings 05/22/2024   Bipolar disorder, in partial remission, most recent episode mixed (HCC) 05/22/2024   Prediabetes 05/22/2024   Mixed hyperlipidemia 05/22/2024   Substance-induced psychotic disorder with hallucinations (HCC) 10/24/2023   Insomnia 08/19/2023   Gastroesophageal reflux disease 10/04/2022   PTSD (post-traumatic stress disorder) 05/03/2019
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
## 2024-08-14 NOTE — ED Notes (Signed)
 Pt A&O x 4 presents with paranoia, auditory hallucinations.  Pt reports he feels someone is out to get him.  Pt states he drank a whole bottle of wine last night and relapsed after 8 years of being sober.  Denies SI, HI or visual hallucinations.  Comfort measures given.  Monitoring for safety.

## 2024-08-14 NOTE — ED Notes (Signed)
 Pt request to discharge. Pt states, I want to leave. When writer asked pt why, pt responded, I'm just ready. Writer reminded pt of the voluntary consent for admission and treatment form he signed with emphasis on the 72-hr request to discharge section. Pt states, So, I'm just ready. Writer encouraged pt to rest before he make an irrational decision and speak with the providers tomorrow. Pt agreed and turned over to go back to sleep. Pt denies withdrawal sx at present or hallucinations. Pt denies SI/HI. Provider made aware of pt statements via secure chat. Safety maintained.

## 2024-08-14 NOTE — ED Notes (Signed)
Pt resting at present, no distress noted.  Monitoring for safety. 

## 2024-08-14 NOTE — ED Notes (Signed)
Pt transferred to [facility]. 

## 2024-08-14 NOTE — ED Notes (Signed)
 Patient resting in bed with eyes closed, respirations even and unlabored, no distress noted, will continue to monitor for safety

## 2024-08-15 ENCOUNTER — Ambulatory Visit: Admitting: Family Medicine

## 2024-08-15 DIAGNOSIS — F1410 Cocaine abuse, uncomplicated: Secondary | ICD-10-CM

## 2024-08-15 DIAGNOSIS — F19951 Other psychoactive substance use, unspecified with psychoactive substance-induced psychotic disorder with hallucinations: Secondary | ICD-10-CM

## 2024-08-15 MED ORDER — PROPRANOLOL HCL 10 MG PO TABS: 10.0000 mg | ORAL_TABLET | Freq: Two times a day (BID) | ORAL | 0 refills | Status: AC

## 2024-08-15 NOTE — ED Notes (Signed)

## 2024-08-15 NOTE — ED Notes (Signed)
 Patient is resting in bed with eyes closed, respirations even and unlabored, no distress noted, will continue to monitor

## 2024-08-15 NOTE — Care Management (Signed)
 [facility] Care Management...  Patient declined inpatient treatment  Writer met with patient to request discharge today   Patient will discharge to home...   [address] [name] [address]  Patient will arrange transportation  Programme Researcher, Broadcasting/film/video will provide information for AA/NA meetings  Patient will follow up with PCP for previous back concerns/surgery

## 2024-08-15 NOTE — ED Provider Notes (Addendum)
 FBC/OBS ASAP Discharge Summary  Date and Time: 08/15/2024 8:57 AM  Name: Jose Mitchell  MRN:  [id number]   Discharge Diagnoses:  Final diagnoses:  Substance-induced psychotic disorder with hallucinations (HCC)  Cocaine abuse, episodic use (HCC)    Subjective: Patient states I am ready to go home today, I need to get back to work.  I am feeling much better.  Jose Mitchell reports feeling paranoid after using cocaine 2 days ago.  Patient relapsed on cocaine after 3 years of sobriety.  He used alcohol x 1 day after 8 years sobriety.  Patient reports feeling anxious related to relapse.  He remains committed to sobriety and would like to attend an AA/AA meetings moving forward. Reviewed residential substance use treatment options and IOP.  Patient declines as he enjoys his work in the [employer].  Jose Mitchell is a 44 year old male who presented voluntarily to [facility] behavioral health urgent care for walk-in assessment on 08/14/2024.  Upon arrival patient endorsed paranoia feeling that people are out to get me all the time.  Patient is insightful today believes paranoia stemmed from cocaine use.  Patient denies SI/HI/AVH.  There is no evidence of delusional thought content and no indication that patient is responding to internal stimuli.  He denies symptoms paranoid today.  Chart reviewed and patient discussed with attending psychiatrist, Dr. Lawrnce, on 08/15/2024.  Patient is reassessed by this nurse practitioner face-to-face.  Patient is seated, no apparent distress.  He presents with euthymic mood, congruent affect.  Patient endorses average sleep and appetite.  Jose Mitchell is followed by outpatient psychiatry at [facility] behavioral health outpatient.  Meets with Staci Kerns for medication management.  Patient is typically compliant with aripiprazole .  Patient currently meeting with Harlene ask for individual counseling 1-2 times per month.  Patient plans to increase individual  counseling visits to weekly.  1 previous inpatient psychiatric hospitalization 09/2023.    Patient resides in bedside with his girlfriend.  He gives verbal consent to speak with girlfriend Kate phone number [phone].  Spoke with patient's girlfriend, Kate, who denies safety concerns.  She confirms no access to weapons in her home.  She will pick up patient later this date.   Patient and family are educated and verbalize understanding of mental health resources and other crisis services in the community. They are instructed to call 911 and present to the nearest emergency room should patient experience any suicidal/homicidal ideation, auditory/visual/hallucinations, or detrimental worsening of mental health condition.       Stay Summary: 08/14/2024-0237am, completed by C. Alan, NP: Trajan Mitchell is a 44 year old male with psychiatric history of bipolar disorder, schizoaffective disorder, anxiety reaction, schizophrenia paranoid type, alcohol and cocaine abuse, who presented voluntarily to [facility] via [facility], with complaints of paranoia and anxiety.   Patient was seen face-to-face by this provider and chart reviewed.   Patient presents as worried and reports my schizophrenia and anxiety just went off the roof today. It gets like this, up and down, and when it started today, I drank a bottle of wine, I also smoked a few cigarettes and some tobacco and I think people are out to get me all the time. I try to put it behind me but it comes on once in a while and I'm constantly thinking negative thoughts....in my mind, I think people have a problem with me but when they come close to me, there is no problem. I've been in and out of jail and prison, and sometimes the paranoia is  worse.   Patient endorses auditory hallucinations of mumbling voices which started early today and states  I can be working and I hear it and try to shake it off, I'm always high alert.    Patient endorses drinking a bottle  of wine today. Smoking a few cigarettes and tobacco due to paranoia and anxiety. He reports being sober from alcohol for 8 years until today.    Patient reports he is established with [facility] with Linnea Kerns and was started on Abilify  5 mg on Dec 23rd, but the medication makes him feel angry whenever he takes it. He endorses seeing a therapist a same center.   He is amenable to overnight stay and agreeable to admission to continuous observation unit for safety monitoring and re-eval in the am for SI/HI/AVH and paranoia.  Patient is provided with opportunity for questions . He verbalized understanding and is in agreement.    On evaluation, patient is alert, oriented x 3, and cooperative. Speech is clear, and coherent. Pt appears casually dressed. Eye contact is good. Mood is anxious, affect is congruent with mood. Thought process is coherent and thought content is WDL. Pt denies SI/HI/VH. He endorses AH and paranoia. There is no objective indication that the patient is responding to internal stimuli. No delusions elicited during this assessment.      Total Time spent with patient: 30 minutes  Past Psychiatric History: Bipolar 2 disorder, schizoaffective disorder, depressive type, schizophrenia, paranoid type, bipolar 1 disorder, alcohol use disorder, cocaine use Past Medical History:  Past Medical History:  Diagnosis Date   Alcohol abuse    Anxiety reaction 12/18/2021   Bipolar 1 disorder (HCC)    Bipolar 1 disorder, depressed (HCC) 04/08/2020   Bipolar 2 disorder, major depressive episode (HCC) 04/08/2020   Bipolar disorder, current episode depressed, severe, with psychotic features (HCC) 10/24/2023   Chronic right shoulder pain 11/24/2021   Cocaine abuse (HCC)    Epigastric pain 01/27/2022   Gastroesophageal reflux disease 10/04/2022   GERD without esophagitis 12/01/2022   Hemorrhoids 12/01/2022   Hiatal hernia 12/01/2022   HNP (herniated nucleus  pulposus), lumbar 10/04/2022   Hx of adenomatous colonic polyps 12/01/2022   Low back pain radiating down leg 02/29/2020   Lumbar stenosis with neurogenic claudication 12/08/2022   Other chronic pain 10/15/2023   Pre-operative clearance 12/03/2022   Restless leg 04/08/2020   Schizoaffective disorder, depressive type (HCC) 01/28/2023   Schizophrenia (HCC)    Schizophrenia, paranoid type (HCC) 06/06/2020   I was diagnosed just dont remember the exact date but it seems it gets worse lately     Sinusitis 10/28/2022    Family History: None reported Family Psychiatric History: None reported Social History: Patient resides in [place] with significant other, girlfriend, [place].  Patient is employed in the [employer].  He denies access to weapons. Tobacco Cessation:  N/A, patient does not currently use tobacco products  Current Medications:  Current Facility-Administered Medications  Medication Dose Route Frequency Provider Last Rate Last Admin   ARIPiprazole  (ABILIFY ) tablet 5 mg  5 mg Oral Daily Ji, Andrew, MD       chlordiazePOXIDE  (LIBRIUM ) capsule 25 mg  25 mg Oral Q6H PRN Lynnette Barter, MD       haloperidol  (HALDOL ) tablet 5 mg  5 mg Oral TID PRN Lynnette Barter, MD       And   diphenhydrAMINE  (BENADRYL ) capsule 50 mg  50 mg Oral TID PRN Lynnette Barter, MD  haloperidol  lactate (HALDOL ) injection 5 mg  5 mg Intramuscular TID PRN Lynnette Barter, MD       And   diphenhydrAMINE  (BENADRYL ) injection 50 mg  50 mg Intramuscular TID PRN Lynnette Barter, MD       And   LORazepam  (ATIVAN ) injection 2 mg  2 mg Intramuscular TID PRN Lynnette Barter, MD       haloperidol  lactate (HALDOL ) injection 10 mg  10 mg Intramuscular TID PRN Lynnette Barter, MD       And   diphenhydrAMINE  (BENADRYL ) injection 50 mg  50 mg Intramuscular TID PRN Lynnette Barter, MD       And   LORazepam  (ATIVAN ) injection 2 mg  2 mg Intramuscular TID PRN Lynnette Barter, MD       hydrOXYzine  (ATARAX ) tablet 25 mg  25 mg Oral TID PRN Lynnette Barter,  MD       ibuprofen  (ADVIL ) tablet 800 mg  800 mg Oral Q8H PRN Lynnette Barter, MD       loperamide  (IMODIUM ) capsule 2-4 mg  2-4 mg Oral PRN Lynnette Barter, MD       magnesium  hydroxide (MILK OF MAGNESIA) suspension 30 mL  30 mL Oral Daily PRN Lynnette Barter, MD       melatonin tablet 3 mg  3 mg Oral QHS PRN Lynnette Barter, MD       methocarbamol  (ROBAXIN ) tablet 500 mg  500 mg Oral BID PRN Lynnette Barter, MD       multivitamin with minerals tablet 1 tablet  1 tablet Oral Daily Lynnette Barter, MD       ondansetron  (ZOFRAN -ODT) disintegrating tablet 4 mg  4 mg Oral Q6H PRN Lynnette Barter, MD       pantoprazole  (PROTONIX ) EC tablet 40 mg  40 mg Oral Daily Ji, Andrew, MD       propranolol  (INDERAL ) tablet 10 mg  10 mg Oral BID Lynnette Barter, MD       Current Outpatient Medications  Medication Sig Dispense Refill   ARIPiprazole  (ABILIFY ) 5 MG tablet Take 1 tablet (5 mg total) by mouth daily. 30 tablet 2   gabapentin  (NEURONTIN ) 100 MG capsule Take 100 mg once daily 2 hours before bedtime (Patient taking differently: Take 100 mg by mouth at bedtime. Take 2 hours before bedtime) 30 capsule 3   ibuprofen  (ADVIL ) 800 MG tablet Take 1 tablet (800 mg total) by mouth every 8 (eight) hours as needed. Take with food to prevent GI upset (Patient taking differently: Take 800 mg by mouth every 8 (eight) hours as needed (For pain). Take with food to prevent GI upset) 30 tablet 0   methocarbamol  (ROBAXIN ) 500 MG tablet Take 1 tablet (500 mg total) by mouth 2 (two) times daily as needed for muscle spasms. Do not take with alcohol or while driving or operating heavy machinery.  May cause drowsiness. 20 tablet 0   mupirocin  ointment (BACTROBAN ) 2 % Apply 1 Application topically 2 (two) times daily.     pantoprazole  (PROTONIX ) 40 MG tablet Take 1 tablet (40 mg total) by mouth daily. 30 tablet 0    PTA Medications:  Facility Ordered Medications  Medication   [COMPLETED] thiamine  (VITAMIN B1) injection 100 mg   magnesium  hydroxide (MILK OF  MAGNESIA) suspension 30 mL   haloperidol  (HALDOL ) tablet 5 mg   And   diphenhydrAMINE  (BENADRYL ) capsule 50 mg   haloperidol  lactate (HALDOL ) injection 5 mg   And   diphenhydrAMINE  (BENADRYL ) injection 50 mg   And  LORazepam  (ATIVAN ) injection 2 mg   haloperidol  lactate (HALDOL ) injection 10 mg   And   diphenhydrAMINE  (BENADRYL ) injection 50 mg   And   LORazepam  (ATIVAN ) injection 2 mg   hydrOXYzine  (ATARAX ) tablet 25 mg   melatonin tablet 3 mg   methocarbamol  (ROBAXIN ) tablet 500 mg   ibuprofen  (ADVIL ) tablet 800 mg   pantoprazole  (PROTONIX ) EC tablet 40 mg   multivitamin with minerals tablet 1 tablet   chlordiazePOXIDE  (LIBRIUM ) capsule 25 mg   loperamide  (IMODIUM ) capsule 2-4 mg   ondansetron  (ZOFRAN -ODT) disintegrating tablet 4 mg   ARIPiprazole  (ABILIFY ) tablet 5 mg   propranolol  (INDERAL ) tablet 10 mg   PTA Medications  Medication Sig   methocarbamol  (ROBAXIN ) 500 MG tablet Take 1 tablet (500 mg total) by mouth 2 (two) times daily as needed for muscle spasms. Do not take with alcohol or while driving or operating heavy machinery.  May cause drowsiness.   gabapentin  (NEURONTIN ) 100 MG capsule Take 100 mg once daily 2 hours before bedtime (Patient taking differently: Take 100 mg by mouth at bedtime. Take 2 hours before bedtime)   ARIPiprazole  (ABILIFY ) 5 MG tablet Take 1 tablet (5 mg total) by mouth daily.   pantoprazole  (PROTONIX ) 40 MG tablet Take 1 tablet (40 mg total) by mouth daily.   ibuprofen  (ADVIL ) 800 MG tablet Take 1 tablet (800 mg total) by mouth every 8 (eight) hours as needed. Take with food to prevent GI upset (Patient taking differently: Take 800 mg by mouth every 8 (eight) hours as needed (For pain). Take with food to prevent GI upset)   mupirocin  ointment (BACTROBAN ) 2 % Apply 1 Application topically 2 (two) times daily.       08/15/2024    8:05 AM 08/13/2024   11:28 AM 05/22/2024    2:01 PM  Depression screen PHQ 2/9  Decreased Interest 0 0 0  Down,  Depressed, Hopeless 0 0 0  PHQ - 2 Score 0 0 0  Altered sleeping  2 0  Tired, decreased energy  1 0  Change in appetite  0 0  Feeling bad or failure about yourself   0 0  Trouble concentrating  1 0  Moving slowly or fidgety/restless  0 0  Suicidal thoughts  0 0  PHQ-9 Score  4 0   Difficult doing work/chores  Somewhat difficult Not difficult at all     Data saved with a previous flowsheet row definition    Flowsheet Row ED from 08/14/2024 in [facility] Most recent reading at 08/14/2024 11:42 AM ED from 08/14/2024 in [facility] Most recent reading at 08/14/2024  4:26 AM UC from 07/17/2024 in [facility] [place] Most recent reading at 07/17/2024  2:34 PM  C-SSRS RISK CATEGORY No Risk No Risk No Risk    Musculoskeletal  Strength & Muscle Tone: within normal limits Gait & Station: normal Patient leans: N/A  Psychiatric Specialty Exam  Presentation  General Appearance:  Appropriate for Environment; Casual  Eye Contact: Good  Speech: Clear and Coherent; Normal Rate  Speech Volume: Normal  Handedness: Right   Mood and Affect  Mood: Euthymic  Affect: Appropriate; Congruent   Thought Process  Thought Processes: Coherent; Goal Directed; Linear  Descriptions of Associations:Intact  Orientation:Full (Time, Place and Person)  Thought Content:Logical; WDL  Diagnosis of Schizophrenia or Schizoaffective disorder in past: Yes  Duration of Psychotic Symptoms: N/A   Hallucinations:Hallucinations: None Description of Auditory Hallucinations: Pt reports hearing  muffled voices  Ideas of Reference:None  Suicidal Thoughts:Suicidal Thoughts: No  Homicidal Thoughts:Homicidal Thoughts: No   Sensorium  Memory: Immediate Good; Recent Fair  Judgment: Good  Insight: Good   Executive Functions  Concentration: Good  Attention Span: Good  Recall: Good  Fund of  Knowledge: Good  Language: Good   Psychomotor Activity  Psychomotor Activity: Psychomotor Activity: Normal   Assets  Assets: Communication Skills; Desire for Improvement; Financial Resources/Insurance; Housing; Leisure Time; Social Support; Resilience; Vocational/Educational; Transportation   Sleep  Sleep: Sleep: Fair  Estimated Sleeping Duration (Last 24 Hours): 17.75-18.50 hours  Nutritional Assessment (For OBS and FBC admissions only) Has the patient had a weight loss or gain of 10 pounds or more in the last 3 months?: No Has the patient had a decrease in food intake/or appetite?: No Does the patient have dental problems?: No Does the patient have eating habits or behaviors that may be indicators of an eating disorder including binging or inducing vomiting?: No Has the patient recently lost weight without trying?: 0 Has the patient been eating poorly because of a decreased appetite?: 0 Malnutrition Screening Tool Score: 0    Physical Exam  Physical Exam Vitals and nursing note reviewed.  Constitutional:      Appearance: Normal appearance. He is well-developed.  HENT:     Head: Normocephalic and atraumatic.  Cardiovascular:     Rate and Rhythm: Normal rate.  Pulmonary:     Effort: Pulmonary effort is normal.  Musculoskeletal:        General: Normal range of motion.     Cervical back: Normal range of motion.  Neurological:     Mental Status: He is alert and oriented to person, place, and time.  Psychiatric:        Attention and Perception: Attention and perception normal.        Mood and Affect: Mood and affect normal.        Speech: Speech normal.        Behavior: Behavior normal. Behavior is cooperative.        Thought Content: Thought content normal.        Cognition and Memory: Cognition and memory normal.        Judgment: Judgment normal.    Review of Systems  Constitutional: Negative.   HENT: Negative.    Eyes: Negative.   Respiratory: Negative.     Cardiovascular: Negative.   Gastrointestinal: Negative.   Genitourinary: Negative.   Musculoskeletal:  Positive for back pain.  Skin: Negative.   Neurological: Negative.   Psychiatric/Behavioral:  Positive for substance abuse.    Blood pressure 115/76, pulse 67, temperature 98.5 F (36.9 C), temperature source Oral, resp. rate 17, SpO2 99%. There is no height or weight on file to calculate BMI.  Demographic Factors:  Male and Caucasian  Loss Factors: NA  Historical Factors: NA  Risk Reduction Factors:   Sense of responsibility to family, Employed, Living with another person, especially a relative, Positive social support, Positive therapeutic relationship, and Positive coping skills or problem solving skills  Continued Clinical Symptoms:  Alcohol/Substance Abuse/Dependencies Previous Psychiatric Diagnoses and Treatments  Cognitive Features That Contribute To Risk:  None    Suicide Risk:  Minimal: No identifiable suicidal ideation.  Patients presenting with no risk factors but with morbid ruminations; may be classified as minimal risk based on the severity of the depressive symptoms  Plan Of Care/Follow-up recommendations:  -Follow-up with your outpatient psychiatric provider -resources including contact information are provided to you  in discharge After Visit Summary (AVS).  Medication list: -Aripiprazole  5 mg daily/mood -Gabapentin  100 mg 2 hours prior to bedtime -Ibuprofen  800 mg every 8 hours as needed/mild pain -Methocarbamol  500 mg twice daily as needed/muscle spasm -Mupirocin  ointment 2% apply topically to affected area twice daily -Pantoprazole  40 mg daily/GERD -Propranolol  10 mg twice daily/hypervigilance, chest palpitation   -Take your psychiatric medications as prescribed at discharge - instructions are provided to you in AVS.   -Follow-up with outpatient primary care doctor and other specialists -for management of preventative medicine and chronic medical  disease.    -Recommend abstinence from alcohol, tobacco, and other illicit drug use at discharge.    -If your psychiatric symptoms recur, worsen, or if you have side effects to your psychiatric medications, call your outpatient psychiatric provider, 911, [phone] or go to the nearest emergency department.   Labs Reviewed: No new lab orders placed. EKG from 08/14/2024 (baseline) with Sinus Tachycardia Qtc measures .    Discharge Planning: Dylin plans to attend NA/AA meetings. Disposition/case management to assist with discharge planning and identification of follow-up needs including residential or outpatient substance use treatment options as needed, prior to discharge Discharge Concerns: Need to confirm safety plan; Medication compliance and effectiveness Discharge Goals: Return home with outpatient referrals for mental health follow-up including medication management/psychotherapy and substance use treatment follow-up resources as appropriate   Disposition: Discharge  Note: This document was prepared using Dragon voice recognition software and may include unintentional dictation errors.  Ellouise LITTIE Dawn, FNP 08/15/2024, 8:57 AM

## 2024-08-15 NOTE — ED Notes (Addendum)
 Patient has been resting in bed this shift. Patient did not participate in group therapy. Patient is easily aroused  and answers questions appropriately when awakened. Patient had no complaints. Denies SI/HI. Calm and cooperative. Will continue to monitor for safety

## 2024-08-15 NOTE — Group Note (Signed)
 Group Topic: Positive Affirmations  Group Date: 08/14/2024 Start Time: 2000 End Time: 2030 Facilitators: Joshua Ellouise CROME  Department: [facility]  Number of Participants: 5  Group Focus: check in Treatment Modality:  Leisure Development Interventions utilized were group exercise Purpose: reinforce self-care  Name: Jose Mitchell Date of Birth: April 08, 1981  MR: [id number]    Level of Participation: Pt did not participate. Quality of Participation:  Interactions with others:  Mood/Affect:  Triggers (if applicable):  Cognition:  Progress:  Response:  Plan:   Patients Problems:  Patient Active Problem List   Diagnosis Date Noted   Paranoia (HCC) 08/14/2024   Encounter for general adult medical examination with abnormal findings 05/22/2024   Bipolar disorder, in partial remission, most recent episode mixed (HCC) 05/22/2024   Prediabetes 05/22/2024   Mixed hyperlipidemia 05/22/2024   Substance-induced psychotic disorder with hallucinations (HCC) 10/24/2023   Insomnia 08/19/2023   Gastroesophageal reflux disease 10/04/2022   PTSD (post-traumatic stress disorder) 05/03/2019

## 2024-08-15 NOTE — Discharge Instructions (Addendum)
 FBC Care Management...  Patient declined inpatient treatment  Patient request discharge today Wednesday 08/15/24  Writer met with patient to discuss discharge plan(s)   Patient will discharge to home...   [address] [place] [address]  Patient will arrange transportation  Programme Researcher, Broadcasting/film/video provided information for AA/NA meetings  Patient will follow up with PCP for previous back concerns/surgeryPatient is instructed prior to discharge to:  Take all medications as prescribed by his/her mental healthcare provider. Report any adverse effects and or reactions from the medicines to his/her outpatient provider promptly. Keep all scheduled appointments, to ensure that you are getting refills on time and to avoid any interruption in your medication.  If you are unable to keep an appointment call to reschedule.  Be sure to follow-up with resources and follow-up appointments provided.  Patient has been instructed & cautioned: To not engage in alcohol and or illegal drug use while on prescription medicines. In the event of worsening symptoms, patient is instructed to call the crisis hotline, 911 and or go to the nearest ED for appropriate evaluation and treatment of symptoms. To follow-up with his/her primary care provider for your other medical issues, concerns and or health care needs.  Information: -National Suicide Prevention Lifeline 1-800-SUICIDE or 337-048-7153.  -988 offers 24/7 access to trained crisis counselors who can help people experiencing mental health-related distress. People can call or text 988 or chat 988lifeline.org for themselves or if they are worried about a loved one who may need crisis support.       Smartphone APP [employer] chair for Alcoholics Anonymous Meeting Schedule

## 2024-08-15 NOTE — Care Plan (Signed)
 Interdisciplinary Treatment and Diagnostic Plan Update  08/15/2024 Time of Session: 10:15am Jose Mitchell MRN: [id number]  Diagnosis:  Final diagnoses:  Substance-induced psychotic disorder with hallucinations (HCC)  Cocaine abuse, episodic use (HCC)     Current Medications:  No current facility-administered medications for this encounter.   Current Outpatient Medications  Medication Sig Dispense Refill   ARIPiprazole  (ABILIFY ) 5 MG tablet Take 1 tablet (5 mg total) by mouth daily. 30 tablet 2   gabapentin  (NEURONTIN ) 100 MG capsule Take 100 mg once daily 2 hours before bedtime (Patient taking differently: Take 100 mg by mouth at bedtime. Take 2 hours before bedtime) 30 capsule 3   ibuprofen  (ADVIL ) 800 MG tablet Take 1 tablet (800 mg total) by mouth every 8 (eight) hours as needed. Take with food to prevent GI upset (Patient taking differently: Take 800 mg by mouth every 8 (eight) hours as needed (For pain). Take with food to prevent GI upset) 30 tablet 0   methocarbamol  (ROBAXIN ) 500 MG tablet Take 1 tablet (500 mg total) by mouth 2 (two) times daily as needed for muscle spasms. Do not take with alcohol or while driving or operating heavy machinery.  May cause drowsiness. 20 tablet 0   mupirocin  ointment (BACTROBAN ) 2 % Apply 1 Application topically 2 (two) times daily.     pantoprazole  (PROTONIX ) 40 MG tablet Take 1 tablet (40 mg total) by mouth daily. 30 tablet 0   propranolol  (INDERAL ) 10 MG tablet Take 1 tablet (10 mg total) by mouth 2 (two) times daily. 60 tablet 0   PTA Medications: Prior to Admission medications  Medication Sig Start Date End Date Taking? Authorizing Provider  ARIPiprazole  (ABILIFY ) 5 MG tablet Take 1 tablet (5 mg total) by mouth daily. 07/17/24 07/17/25  Ezzard Staci SAILOR, NP  gabapentin  (NEURONTIN ) 100 MG capsule Take 100 mg once daily 2 hours before bedtime Patient taking differently: Take 100 mg by mouth at bedtime. Take 2 hours before bedtime 07/04/24    Bacchus, Gloria Z, FNP  ibuprofen  (ADVIL ) 800 MG tablet Take 1 tablet (800 mg total) by mouth every 8 (eight) hours as needed. Take with food to prevent GI upset Patient taking differently: Take 800 mg by mouth every 8 (eight) hours as needed (For pain). Take with food to prevent GI upset 07/30/24   Bacchus, Meade PEDLAR, FNP  methocarbamol  (ROBAXIN ) 500 MG tablet Take 1 tablet (500 mg total) by mouth 2 (two) times daily as needed for muscle spasms. Do not take with alcohol or while driving or operating heavy machinery.  May cause drowsiness. 06/28/24   Chandra Harlene LABOR, NP  mupirocin  ointment (BACTROBAN ) 2 % Apply 1 Application topically 2 (two) times daily. 07/24/24   [provider]  pantoprazole  (PROTONIX ) 40 MG tablet Take 1 tablet (40 mg total) by mouth daily. 07/20/24   Bacchus, Meade PEDLAR, FNP  propranolol  (INDERAL ) 10 MG tablet Take 1 tablet (10 mg total) by mouth 2 (two) times daily. 08/15/24   Dasie Ellouise CROME, FNP    Patient Stressors: Medication change or noncompliance   Substance abuse    Patient Strengths: Capable of independent living  Motivation for treatment/growth   Treatment Modalities: Medication Management, Group therapy, Case management,  1 to 1 session with clinician, Psychoeducation, Recreational therapy.   Physician Treatment Plan for Primary and Secondary Diagnosis:  Final diagnoses:  Substance-induced psychotic disorder with hallucinations (HCC)  Cocaine abuse, episodic use (HCC)   Long Term Goal(s):  -Take your psychiatric medications as prescribed at discharge -  instructions are provided to you in AVS.   -Follow-up with outpatient primary care doctor and other specialists -for management of preventative medicine and chronic medical disease.    -Recommend abstinence from alcohol, tobacco, and other illicit drug use at discharge.    -If your psychiatric symptoms recur, worsen, or if you have side effects to your psychiatric medications, call your outpatient  psychiatric provider, [phone] or go to the nearest emergency department.  Short Term Goals:   Jose Mitchell plans to attend NA/AA meetings. Disposition/case management to assist with discharge planning and identification of follow-up needs including residential or outpatient substance use treatment options as needed, prior to discharge Discharge Concerns: Need to confirm safety plan; Medication compliance and effectiveness Discharge Goals: Return home with outpatient referrals for mental health follow-up including medication management/psychotherapy and substance use treatment follow-up resources as appropriate    Medication Management: Evaluate patient's response, side effects, and tolerance of medication regimen.  Therapeutic Interventions: 1 to 1 sessions, Unit Group sessions and Medication administration.  Evaluation of Outcomes: Adequate for Discharge.  Client reported this morning he was ready to leave immediately.  Providers discharged the client after assessing concerns and stabilization.    Client reports no SI/HI and or plans. Client reports no AVH.   LCSW Treatment Plan for Primary Diagnosis:  Final diagnoses:  Substance-induced psychotic disorder with hallucinations (HCC)  Cocaine abuse, episodic use (HCC)    Long Term Goal(s): Safe transition to appropriate next level of care at discharge.  Short Term Goals: Facilitate acceptance of mental health diagnosis and concerns through verbal commitment to aftercare plan and appointments at discharge.  Therapeutic Interventions: Assess for all discharge needs, 1 to 1 time with Child psychotherapist, Explore available resources and support systems, Assess for adequacy in community support network, Educate family and significant other(s) on suicide prevention, Complete Psychosocial Assessment, Interpersonal group therapy.  Evaluation of Outcomes: Adequate for Discharge   Progress in Treatment: Attending groups: No. Participating in groups:  No. Taking medication as prescribed: Yes. Toleration medication: No. Family/Significant other contact made: Yes, individual(s) contacted:  girlfriend Patient understands diagnosis: Yes. Discussing patient identified problems/goals with staff: Yes. Medical problems stabilized or resolved: As evidenced by:  Client reports no SI/HI and or plans.  Client reports no AVH.  Client reports his psychosis was substance induced and reports no other concerns.  Client is asking to be discharged immediately despite arriving on 08/14/24 Denies suicidal/homicidal ideation: Yes. Issues/concerns per patient self-inventory: No. Other: NA  New problem(s) identified: No, Describe:  NA  New Short Term/Long Term Goal(s):  Client reports he's completed detox and wants to go home.  Patient Goals:  client reports he will take his medication as prescribed and attend weekly NA meetings.    Discharge Plan or Barriers: Client reports no barriers.   Reason for Continuation of Hospitalization: Other; describe Client will be discharged today 08/15/24 after requesting to leave. Providers assessed the client and informed him he can leave today.  Client's girlfriend will pick him up.  Estimated Length of Stay:08/14/24 -08/15/24  Last 3 [place] Suicide Severity Risk Score: Flowsheet Row ED from 08/14/2024 in [facility] Most recent reading at 08/14/2024 11:42 AM ED from 08/14/2024 in [facility] Most recent reading at 08/14/2024  4:26 AM UC from 07/17/2024 in [facility] [place] Most recent reading at 07/17/2024  2:34 PM  C-SSRS RISK CATEGORY No Risk No Risk No Risk    Last PHQ 2/9 Scores:  08/15/2024    8:05 AM 08/13/2024   11:28 AM 05/22/2024    2:01 PM  Depression screen PHQ 2/9  Decreased Interest 0 0 0  Down, Depressed, Hopeless 0 0 0  PHQ - 2 Score 0 0 0  Altered sleeping  2 0  Tired, decreased energy  1 0  Change in appetite  0 0   Feeling bad or failure about yourself   0 0  Trouble concentrating  1 0  Moving slowly or fidgety/restless  0 0  Suicidal thoughts  0 0  PHQ-9 Score  4 0   Difficult doing work/chores  Somewhat difficult Not difficult at all     Data saved with a previous flowsheet row definition    Scribe for Treatment Team: Avey Mcmanamon, LCSW 08/15/2024 3:41 PM

## 2024-08-15 NOTE — ED Notes (Signed)
 Patient A&Ox4. Denies intent to harm self/others when asked. Denies A/VH. Patient denies any physical complaints when asked. No acute distress noted. Support and encouragement provided. Routine safety checks conducted according to facility protocol. Encouraged patient to notify staff if thoughts of harm toward self or others arise. Patient verbalize understanding and agreement. Will continue to monitor for safety.

## 2024-08-16 ENCOUNTER — Ambulatory Visit

## 2024-08-16 DIAGNOSIS — K219 Gastro-esophageal reflux disease without esophagitis: Secondary | ICD-10-CM

## 2024-08-16 DIAGNOSIS — M549 Dorsalgia, unspecified: Secondary | ICD-10-CM

## 2024-08-16 MED ORDER — METHOCARBAMOL 750 MG PO TABS: 750.0000 mg | ORAL_TABLET | Freq: Three times a day (TID) | ORAL | 3 refills | Status: AC | PRN

## 2024-08-16 MED ORDER — IBUPROFEN 800 MG PO TABS: 800.0000 mg | ORAL_TABLET | Freq: Three times a day (TID) | ORAL | 3 refills | Status: AC | PRN

## 2024-08-16 MED ORDER — PANTOPRAZOLE SODIUM 40 MG PO TBEC: 40.0000 mg | DELAYED_RELEASE_TABLET | Freq: Every day | ORAL | 3 refills | Status: AC

## 2024-08-16 NOTE — Progress Notes (Signed)
" ° °  Established Patient Office Visit  Subjective   Patient ID: Jose Mitchell, male    DOB: 03-25-1981  Age: 44 y.o. MRN: [id number]  Chief Complaint  Patient presents with   Medical Management of Chronic Issues    6 month follow up/ right leg pain in back of thigh     HPI  Patient Active Problem List   Diagnosis Date Noted   Paranoia (HCC) 08/14/2024   Encounter for general adult medical examination with abnormal findings 05/22/2024   Bipolar disorder, in partial remission, most recent episode mixed (HCC) 05/22/2024   Prediabetes 05/22/2024   Mixed hyperlipidemia 05/22/2024   Substance-induced psychotic disorder with hallucinations (HCC) 10/24/2023   Insomnia 08/19/2023   Gastroesophageal reflux disease 10/04/2022   Acute back pain 02/29/2020   PTSD (post-traumatic stress disorder) 05/03/2019      ROS    Objective:     BP 131/88   Pulse 85   Ht 6' 2 (1.88 m)   Wt 219 lb (99.3 kg)   SpO2 95%   BMI 28.12 kg/m  BP Readings from Last 3 Encounters:  08/16/24 131/88  08/13/24 117/84  07/17/24 123/78   Wt Readings from Last 3 Encounters:  08/16/24 219 lb (99.3 kg)  08/13/24 219 lb 6 oz (99.5 kg)  05/22/24 225 lb 3.2 oz (102.2 kg)     Physical Exam Vitals and nursing note reviewed.  Constitutional:      Appearance: Normal appearance. He is obese.  HENT:     Head: Normocephalic.  Eyes:     Extraocular Movements: Extraocular movements intact.     Pupils: Pupils are equal, round, and reactive to light.  Cardiovascular:     Rate and Rhythm: Normal rate and regular rhythm.  Pulmonary:     Effort: Pulmonary effort is normal.     Breath sounds: Normal breath sounds.  Musculoskeletal:     Cervical back: Normal range of motion and neck supple.  Neurological:     Mental Status: He is alert and oriented to person, place, and time.  Psychiatric:        Mood and Affect: Mood normal.        Thought Content: Thought content normal.      No results found for any  visits on 08/16/24.    The 10-year ASCVD risk score (Arnett DK, et al., 2019) is: 1.2%    Assessment & Plan:   Problem List Items Addressed This Visit       Digestive   Gastroesophageal reflux disease (Chronic)   Chronic GERD requiring medication management. - Refilled acid reflux medication.      Relevant Medications   pantoprazole  (PROTONIX ) 40 MG tablet     Other   Acute back pain   Chronic lumbar radiculopathy with sciatic pain. History of L5-S1 laminectomy and disc protrusion. Previous steroid injections provided temporary relief. - Prescribed higher dose Robaxin  for pain management. - Continue follow-up with orthopedic care on January 28th, 2026.      Relevant Medications   methocarbamol  (ROBAXIN ) 750 MG tablet   ibuprofen  (ADVIL ) 800 MG tablet       No follow-ups on file.    Leita Longs, FNP  "

## 2024-08-17 ENCOUNTER — Other Ambulatory Visit (HOSPITAL_COMMUNITY): Payer: Self-pay | Admitting: *Deleted

## 2024-08-17 DIAGNOSIS — G2581 Restless legs syndrome: Secondary | ICD-10-CM

## 2024-08-17 MED ORDER — GABAPENTIN 100 MG PO CAPS: ORAL_CAPSULE | ORAL | 0 refills | Status: AC

## 2024-08-17 MED ORDER — ARIPIPRAZOLE 5 MG PO TABS: 5.0000 mg | ORAL_TABLET | Freq: Every day | ORAL | 0 refills | Status: AC

## 2024-08-20 NOTE — Telephone Encounter (Signed)
 error

## 2024-08-22 ENCOUNTER — Ambulatory Visit: Admitting: Physical Medicine and Rehabilitation

## 2024-08-23 ENCOUNTER — Encounter (HOSPITAL_COMMUNITY): Payer: Self-pay

## 2024-08-23 ENCOUNTER — Ambulatory Visit (HOSPITAL_COMMUNITY): Admitting: Licensed Clinical Social Worker

## 2024-08-23 NOTE — Assessment & Plan Note (Signed)
 Chronic GERD requiring medication management. - Refilled acid reflux medication.

## 2024-08-23 NOTE — Assessment & Plan Note (Signed)
 Chronic lumbar radiculopathy with sciatic pain. History of L5-S1 laminectomy and disc protrusion. Previous steroid injections provided temporary relief. - Prescribed higher dose Robaxin  for pain management. - Continue follow-up with orthopedic care on January 28th, 2026.

## 2024-08-31 ENCOUNTER — Encounter: Payer: Self-pay | Admitting: Family Medicine

## 2024-09-07 ENCOUNTER — Ambulatory Visit: Admitting: Sports Medicine

## 2024-09-17 ENCOUNTER — Telehealth (HOSPITAL_COMMUNITY): Admitting: Family

## 2024-09-28 ENCOUNTER — Ambulatory Visit: Admitting: Family Medicine

## 2024-11-12 ENCOUNTER — Ambulatory Visit: Payer: Self-pay

## 2025-05-24 ENCOUNTER — Encounter: Admitting: Family Medicine
